# Patient Record
Sex: Female | Born: 1965 | Race: White | Hispanic: Yes | Marital: Single | Smoking: Never smoker
Health system: Southern US, Community
[De-identification: ages and names within clinical notes are randomized; demographics above are authoritative.]

## PROBLEM LIST (undated history)

## (undated) DIAGNOSIS — E079 Disorder of thyroid, unspecified: Secondary | ICD-10-CM

## (undated) DIAGNOSIS — E119 Type 2 diabetes mellitus without complications: Secondary | ICD-10-CM

## (undated) HISTORY — PX: FRACTURE SURGERY: SHX138
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
## (undated) NOTE — *Deleted (*Deleted)
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 Intravenous Given De Nurse, CRNA     11/15/2019 0656 PHENYLephrine 40 mcg/ml in normal saline Adult IV Push Syringe (For Blood Pressure Support) 80 mcg Intravenous Given De Nurse, CRNA     11/15/2019 0523 propofol (DIPRIVAN) 10 mg/mL bolus/IV push 140 mg Intravenous Given Molli Hazard, CRNA     11/15/2019 0523 succinylcholine 20 mg/mL (10 mL) syringe for Medfusion Pump - Optime 160 mg Intravenous Given Molli Hazard, CRNA     11/14/2019 2121 Tdap (BOOSTRIX) injection 0.5 mL 0.5 mL Intramuscular Given Phillips Grout, RN       Mobility {Mobility:20148}

---

## 2008-10-14 ENCOUNTER — Ambulatory Visit: Payer: Self-pay

## 2017-01-02 ENCOUNTER — Ambulatory Visit: Payer: Self-pay | Admitting: Adult Health Nurse Practitioner

## 2017-01-02 VITALS — BP 133/78 | HR 78 | Temp 97.9°F | Wt 190.2 lb

## 2017-01-02 DIAGNOSIS — M545 Low back pain, unspecified: Secondary | ICD-10-CM

## 2017-01-02 DIAGNOSIS — M79642 Pain in left hand: Secondary | ICD-10-CM

## 2017-01-02 DIAGNOSIS — Z0000 Encounter for general adult medical examination without abnormal findings: Secondary | ICD-10-CM

## 2017-01-02 MED ORDER — MELOXICAM 7.5 MG PO TABS: 7.5000 mg | ORAL_TABLET | ORAL | 0 refills | Status: DC | PRN

## 2017-01-02 NOTE — Patient Instructions (Signed)
Ejercicios para la espalda (Back Exercises) Si tiene dolor de espalda, haga estos ejercicios 2 o 3veces por da, o como se lo haya indicado el mdico. Cuando el dolor desaparezca, hgalos una vez por da, pero haga ms repeticiones de cada ejercicio. Si no le duele la espalda, haga estos ejercicios una vez por da o como se lo haya indicado el mdico. EJERCICIOS Rodilla al pecho Repita estos pasos 3 o 5veces seguidas con cada pierna: 1. Acustese boca arriba sobre una cama dura o sobre el suelo con las piernas extendidas. 2. Lleve una rodilla al pecho. 3. Mantenga la rodilla contra el pecho. Para lograrlo tmese la rodilla o el muslo. 4. Tire de la rodilla hasta sentir una elongacin suave en la parte baja de la espalda. 5. Mantenga la elongacin durante 10 a 30segundos. 6. Suelte y extienda la pierna lentamente. Inclinacin de la pelvis Repita estos pasos 5 o 10veces seguidas: 1. Acustese boca arriba sobre una cama dura o sobre el suelo con las piernas extendidas. 2. Flexione las rodillas de manera que apunten al techo. Los pies deben estar apoyados en el suelo. 3. Contraiga los msculos de la parte baja del vientre (abdomen) para empujar la zona lumbar contra el suelo. Este movimiento har que el cccix apunte hacia el techo, en lugar de apuntar hacia abajo en direccin a los pies o al suelo. 4. Mantenga esta posicin durante 5 a 10segundos mientras contrae suavemente los msculos y respira con normalidad. El perro y el gato Repita estos pasos hasta que la zona lumbar se curve con ms facilidad: 1. Apoye las palmas de las manos y las rodillas sobre una superficie firme. Las manos deben estar alineadas con los hombros y las rodillas con las caderas. Puede colocarse almohadillas debajo de las rodillas. 2. Deje caer la cabeza y lleve el cccix hacia abajo de modo que apunte en direccin al suelo para que la zona lumbar se arquee como el lomo de un gato asustado. 3. Mantenga esta posicin  durante 5segundos. 4. Lentamente, levante la cabeza y lleve el cccix hacia arriba de modo que apunte en direccin al techo para que la espalda se arquee (hunda) como el lomo de un perro contento. 5. Mantenga esta posicin durante 5segundos. Flexiones de brazos Repita estos pasos 5 o 10veces seguidas: 1. Acustese boca abajo en el suelo. 2. Ponga las manos cerca de la cabeza, separadas aproximadamente al ancho de los hombros. 3. Con la espalda relajada y las caderas apoyadas en el suelo, extienda lentamente los brazos para levantar la mitad superior del cuerpo y elevar los hombros. No use los msculos de la espalda. Para estar ms cmodo, puede cambiar la ubicacin de las manos. 4. Mantenga esta posicin durante 5segundos. 5. Lentamente vuelva a la posicin horizontal. Puentes Repita estos pasos 10veces seguidas: 1. Acustese boca arriba sobre una superficie firme. 2. Flexione las rodillas de manera que apunten al techo. Los pies deben estar apoyados en el suelo. 3. Contraiga los glteos y despegue las nalgas del suelo hasta que la cintura est casi a la altura de las rodillas. Si no siente el trabajo muscular en las nalgas y la parte posterior de los muslos, aleje los pies 1 o 2pulgadas (2,5 o 5centmetros) de las nalgas. 4. Mantenga esta posicin durante 3 a 5segundos. 5. Lentamente, vuelva a apoyar las nalgas en el suelo y relaje los glteos. Si este ejercicio le resulta muy fcil, intente realizarlo con los brazos cruzados sobre el pecho. Abdominales Repita estos pasos 5 o   10veces seguidas: 1. Acustese boca arriba sobre una cama dura o sobre el suelo con las piernas extendidas. 2. Flexione las rodillas de manera que apunten al techo. Los pies deben estar apoyados en el suelo. 3. Cruce los brazos sobre el pecho. 4. Baje levemente el mentn en direccin al pecho, pero no doble el cuello. 5. Contraiga los msculos del abdomen y con lentitud eleve el pecho lo suficiente como para  despegar levemente los omplatos del suelo. 6. Lentamente baje el pecho y la cabeza hasta el suelo. Elevaciones de espalda Repita estos pasos 5 o 10veces seguidas: 1. Acustese boca abajo con los brazos a los costados y apoye la frente en el suelo. 2. Contraiga los msculos de las piernas y los glteos. 3. Lentamente despegue el pecho del suelo mientras mantiene las caderas apoyadas en el suelo. Mantenga la nuca alineada con la curvatura de la espalda. Mire hacia el suelo mientras hace este ejercicio. 4. Mantenga esta posicin durante 3 a 5segundos. 5. Lentamente baje el pecho y el rostro hasta el suelo. SOLICITE AYUDA SI:  El dolor de espalda se vuelve mucho ms intenso cuando hace un ejercicio.  El dolor de espalda no se alivia 2horas despus de hacer los ejercicios. Si tiene alguno de estos problemas, deje de hacer los ejercicios. No vuelva a hacer los ejercicios a menos que el mdico lo autorice. SOLICITE AYUDA DE INMEDIATO SI:  Siente un dolor sbito y muy intenso en la espalda. Si esto ocurre, deje de hacer los ejercicios. No vuelva a hacer los ejercicios a menos que el mdico lo autorice. Esta informacin no tiene como fin reemplazar el consejo del mdico. Asegrese de hacerle al mdico cualquier pregunta que tenga. Document Released: 05/24/2010 Document Revised: 05/31/2015 Document Reviewed: 04/02/2014 Elsevier Interactive Patient Education  2018 Elsevier Inc.  

## 2017-01-02 NOTE — Progress Notes (Signed)
  Patient: Kristen Aguirre Female    DOB: 1965-09-14   51 y.o.   MRN: [id number] Visit Date: 01/02/2017  Today's Provider: Jacelyn Pieah Doles-Johnson, NP   No chief complaint on file.  Subjective:    HPI  Pt states that she often has pain in her back and hip.   L pinky pain while trying to assist a family member with a fall the pinky got malpositioned now it hurts all the time- itches on top.  Taking Tylenol for pain with minimal relief.     Not taking any medications regularly.   Allergies not on file This SmartLink is deprecated. Use AVSMEDLIST instead to display the medication list for a patient.  Review of Systems  All other systems reviewed and are negative.   Social History   Tobacco Use  . Smoking status: Not on file  Substance Use Topics  . Alcohol use: Not on file   Objective:   BP 133/78 (BP Location: Right Arm)   Pulse 78   Temp 97.9 F (36.6 C)   Wt 190 lb 3.2 oz (86.3 kg)   Physical Exam  Constitutional: She is oriented to person, place, and time. She appears well-developed and well-nourished.  HENT:  Head: Normocephalic and atraumatic.  Eyes: Pupils are equal, round, and reactive to light.  Neck: Normal range of motion. Neck supple.  Cardiovascular: Normal rate, regular rhythm, normal heart sounds and intact distal pulses.  Pulmonary/Chest: Effort normal and breath sounds normal.  Abdominal: Soft. Bowel sounds are normal.  Musculoskeletal: She exhibits no edema.       Lumbar back: She exhibits tenderness and pain. She exhibits normal range of motion, no edema and no deformity.       Hands: Tenderness to palpation of the L spine, some pain with ROM but full ROM.     Neurological: She is alert and oriented to person, place, and time.  Skin: Skin is warm and dry.        Assessment & Plan:     L hand pain:  Xray.  Meloxicam daily as needed for pain.   Back pain:  Referral to [facility].  Back exercises.  May also take the meloxicam PRN pain.    Routine labs ordered.  FU PRN labs and xray results.       Jacelyn Pieah Doles-Johnson, NP   [facility] [place]

## 2017-01-03 LAB — COMPREHENSIVE METABOLIC PANEL
ALT: 57 IU/L — ABNORMAL HIGH (ref 0–32)
AST: 45 IU/L — ABNORMAL HIGH (ref 0–40)
Albumin/Globulin Ratio: 1.4 (ref 1.2–2.2)
Albumin: 4 g/dL (ref 3.5–5.5)
Alkaline Phosphatase: 68 IU/L (ref 39–117)
BUN/Creatinine Ratio: 11 (ref 9–23)
BUN: 8 mg/dL (ref 6–24)
Bilirubin Total: <0.2 mg/dL (ref 0.0–1.2)
CO2: 27 mmol/L (ref 20–29)
Calcium: 9.1 mg/dL (ref 8.7–10.2)
Chloride: 101 mmol/L (ref 96–106)
Creatinine, Ser: 0.73 mg/dL (ref 0.57–1.00)
GFR calc Af Amer: 112 mL/min/{1.73_m2} (ref >59)
GFR calc non Af Amer: 97 mL/min/{1.73_m2} (ref >59)
Globulin, Total: 2.9 g/dL (ref 1.5–4.5)
Glucose: 140 mg/dL — ABNORMAL HIGH (ref 65–99)
Potassium: 4.2 mmol/L (ref 3.5–5.2)
Sodium: 143 mmol/L (ref 134–144)
Total Protein: 6.9 g/dL (ref 6.0–8.5)

## 2017-01-03 LAB — CBC
Hematocrit: 41.5 % (ref 34.0–46.6)
Hemoglobin: 14.2 g/dL (ref 11.1–15.9)
MCH: 31.8 pg (ref 26.6–33.0)
MCHC: 34.2 g/dL (ref 31.5–35.7)
MCV: 93 fL (ref 79–97)
Platelets: 233 10*3/uL (ref 150–379)
RBC: 4.46 x10E6/uL (ref 3.77–5.28)
RDW: 12.9 % (ref 12.3–15.4)
WBC: 7.6 10*3/uL (ref 3.4–10.8)

## 2017-01-03 LAB — HEMOGLOBIN A1C
Est. average glucose Bld gHb Est-mCnc: 137 mg/dL
Hgb A1c MFr Bld: 6.4 % — ABNORMAL HIGH (ref 4.8–5.6)

## 2017-01-03 LAB — TSH: TSH: 5.01 u[IU]/mL — ABNORMAL HIGH (ref 0.450–4.500)

## 2017-01-03 LAB — LIPID PANEL
Chol/HDL Ratio: 4.9 ratio — ABNORMAL HIGH (ref 0.0–4.4)
Cholesterol, Total: 232 mg/dL — ABNORMAL HIGH (ref 100–199)
HDL: 47 mg/dL (ref >39)
LDL Calculated: 134 mg/dL — ABNORMAL HIGH (ref 0–99)
Triglycerides: 257 mg/dL — ABNORMAL HIGH (ref 0–149)
VLDL Cholesterol Cal: 51 mg/dL — ABNORMAL HIGH (ref 5–40)

## 2017-01-18 ENCOUNTER — Ambulatory Visit: Payer: Self-pay | Admitting: Urology

## 2017-01-18 VITALS — BP 129/77 | HR 70 | Temp 98.7°F | Wt 190.6 lb

## 2017-01-18 DIAGNOSIS — Z0000 Encounter for general adult medical examination without abnormal findings: Secondary | ICD-10-CM

## 2017-01-18 NOTE — Progress Notes (Signed)
  Patient: Kristen Aguirre Female    DOB: 04-Mar-1965   51 y.o.   MRN: [id number] Visit Date: 01/18/2017  Today's Provider: [facility]   Chief Complaint  Patient presents with  . Hand Pain    thinks she broke her finger and says it is itchy and hurts when she uses it. 8/10 on pain scale, has tried soaking it in vinger but no reflief of symptoms   Subjective:    Kristen Aguirre is a 51 y/o woman with no significant PMHx here for f/u of hand pain and labs  Labs A1c 6.4. Elevated lipids. Poor diet. Lots of fried foods. Lots of carbs. Little fruits/vegetables. Reports no exercise due to lack of exercise  Thyroid TSH elevated. Pt reports increased loss of hair over the past year. Energy levels low.   Left hand pain: over last 2 months. Tylenol improves pain. Was evaluated here a few weeks ago. Did not have xray done.   Back pain. Stable. No radiating pains. No bowel/bladder incontinence. Did not see chiropractor.       No Known Allergies This SmartLink is deprecated. Use AVSMEDLIST instead to display the medication list for a patient.  Review of Systems  All other systems reviewed and are negative.   Social History   Tobacco Use  . Smoking status: Never Smoker  . Smokeless tobacco: Never Used  Substance Use Topics  . Alcohol use: Yes   Objective:   BP 129/77   Pulse 70   Temp 98.7 F (37.1 C)   Wt 190 lb 9.6 oz (86.5 kg)   Physical Exam  Constitutional: She is oriented to person, place, and time. She appears well-developed and well-nourished.  HENT:  Head: Normocephalic and atraumatic.  Eyes: Conjunctivae are normal.  Cardiovascular: Normal rate, regular rhythm and normal heart sounds.  Pulmonary/Chest: Effort normal and breath sounds normal.  Musculoskeletal:       Arms: Neurological: She is alert and oriented to person, place, and time.  Skin: Skin is warm and dry.  Psychiatric: She has a normal mood and affect.        Assessment &  Plan:     Kristen Aguirre is a 51 y/o woman with no significant PMHx here for f/u of hand pain and labs  Hypothyroid Started levothyroxine today. F/u in one month for repeat TSH.  Pre-DM A1c 6.4. Encouraged diet/exercise regimen, pt verbalizes understanding.  Elevated lipids Elevated lipid panel. Encouraged diet/exercise regimen, pt verbalizes understanding.  Hand pain Stable since last visit. Pt to get xray. Pt wlil pick up meloxicam prescription and try. Pt will try icing as well.   Back pain Stable. Pt to return to see chiropractor      [facility]   [facility] [place]

## 2017-01-23 MED ORDER — LEVOTHYROXINE SODIUM 50 MCG PO TABS: 50.0000 ug | ORAL_TABLET | Freq: Every day | ORAL | 1 refills | Status: DC

## 2017-01-24 ENCOUNTER — Ambulatory Visit: Payer: Self-pay | Admitting: Chiropractor

## 2017-01-31 ENCOUNTER — Ambulatory Visit: Payer: Self-pay | Admitting: Specialist

## 2017-02-07 ENCOUNTER — Ambulatory Visit: Payer: Self-pay | Admitting: Specialist

## 2017-02-07 DIAGNOSIS — M549 Dorsalgia, unspecified: Secondary | ICD-10-CM

## 2017-02-07 NOTE — Progress Notes (Signed)
   Subjective:    Patient ID: Kristen Aguirre, female    DOB: 08-05-1965, 51 y.o.   MRN: [id number]   Exam done with Maralyn SagoSarah from [facility].  HPI Several year of hx axial back pain.    Review of Systems     Objective:   Physical Exam Gait normal. She is able to heel/toe walk. She does a full squat with increased LBP. She arises monophasic manner. On inspection, her shoulders and pelvis are level. She is able to march in place with normal muscle recruitment and relaxation. She is able to stand on eavh leg independently with neg Trendelenburg sign.  Toe signs are downward. No clonus present. KJ 1+=. AJ 2+=. Sensation Normal. MMT 5/5. SLR neg at 90 degrees sitting. No TTP.  Incidentally notes mallet deformity left 5th finger. Telling pt to go to xray and will follow up with PCP.      Assessment & Plan:  IMP: axial back pain. Probable age and weight. [employer] for PT home exercise program. Return PRN.

## 2017-02-15 ENCOUNTER — Other Ambulatory Visit: Payer: Self-pay

## 2017-02-22 ENCOUNTER — Ambulatory Visit: Payer: Self-pay

## 2017-02-28 ENCOUNTER — Ambulatory Visit: Payer: Self-pay | Attending: Oncology | Admitting: *Deleted

## 2017-02-28 ENCOUNTER — Ambulatory Visit
Admission: RE | Admit: 2017-02-28 | Discharge: 2017-02-28 | Disposition: A | Discharge status: Home / Self Care | Payer: Self-pay | Source: Ambulatory Visit | Attending: Oncology | Admitting: Oncology

## 2017-02-28 ENCOUNTER — Encounter: Payer: Self-pay | Admitting: *Deleted

## 2017-02-28 VITALS — BP 112/77 | HR 58 | Temp 97.4°F | Resp 18 | Ht 63.0 in | Wt 183.0 lb

## 2017-02-28 DIAGNOSIS — Z0000 Encounter for general adult medical examination without abnormal findings: Secondary | ICD-10-CM

## 2017-02-28 NOTE — Patient Instructions (Signed)
Gave patient hand-out, Women Staying Healthy, Active and Well from [facility], with education on breast health, pap smears, heart and colon health. 

## 2017-02-28 NOTE — Progress Notes (Signed)
Subjective:     Patient ID: Kristen Aguirre, female   DOB: October 21, 1965, 52 y.o.   MRN: [id number]  HPI   Review of Systems     Objective:   Physical Exam  Pulmonary/Chest: Right breast exhibits no inverted nipple, no mass, no nipple discharge, no skin change and no tenderness. Left breast exhibits no inverted nipple, no mass, no nipple discharge, no skin change and no tenderness. Breasts are symmetrical.  Abdominal: There is no splenomegaly or hepatomegaly.  Genitourinary: No labial fusion. There is no rash, tenderness, lesion or injury on the right labia. There is no rash, tenderness, lesion or injury on the left labia. Right adnexum displays no mass, no tenderness and no fullness. Left adnexum displays no mass, no tenderness and no fullness. No erythema, tenderness or bleeding in the vagina. No foreign body in the vagina. No signs of injury around the vagina. No vaginal discharge found.  Genitourinary Comments: Patient with history of hysterectomy for a deceased fetus       Assessment:     52 year [facility] Hispanic [facility] pelvic exam and [facility].  Kristen CockingMaritza, the interpreter present during the interview and exam.  Clinical breast exam unremarkable.  Taught self breast awareness.  Pelvic exam without masses or lesions, no evidence of a cervix noted with is compatible with a hysterectomy.  Patient has been screened for eligibility.  She does not have any insurance, Medicare or Medicaid.  She also meets financial eligibility.  Hand-out given on the Affordable Care Act.    Plan:     Screening mammogram ordered.  Will follow-up per [facility] protocol.

## 2017-03-08 ENCOUNTER — Other Ambulatory Visit: Payer: Self-pay

## 2017-03-08 DIAGNOSIS — Z0000 Encounter for general adult medical examination without abnormal findings: Secondary | ICD-10-CM

## 2017-03-09 LAB — TSH: TSH: 1.53 u[IU]/mL (ref 0.450–4.500)

## 2017-03-13 ENCOUNTER — Ambulatory Visit: Payer: Self-pay

## 2017-03-21 ENCOUNTER — Encounter: Payer: Self-pay | Admitting: *Deleted

## 2017-03-21 NOTE — Progress Notes (Signed)
Letter mailed from the [facility] to inform patient of her normal mammogram results.  Patient is to follow-up with annual screening in one year.  [facility] to [place]. 

## 2017-03-27 ENCOUNTER — Telehealth: Payer: Self-pay | Admitting: Pharmacy Technician

## 2017-03-27 NOTE — Telephone Encounter (Signed)
Pt is approved to receive medication assistance at [facility] through 2019, as long as eligibility continues to be met.  Betty J. Kluttz Care Manager [facility] 

## 2018-05-06 ENCOUNTER — Ambulatory Visit
Admission: RE | Admit: 2018-05-06 | Discharge: 2018-05-06 | Disposition: A | Discharge status: Home / Self Care | Payer: Self-pay | Source: Ambulatory Visit | Attending: Oncology | Admitting: Oncology

## 2018-05-06 ENCOUNTER — Ambulatory Visit: Payer: Self-pay | Attending: Oncology

## 2018-05-06 ENCOUNTER — Other Ambulatory Visit: Payer: Self-pay

## 2018-05-06 VITALS — BP 138/102 | HR 72 | Temp 98.4°F | Ht 60.0 in | Wt 175.0 lb

## 2018-05-06 DIAGNOSIS — Z0000 Encounter for general adult medical examination without abnormal findings: Secondary | ICD-10-CM | POA: Insufficient documentation

## 2018-05-06 NOTE — Progress Notes (Signed)
  Subjective:     Patient ID: Kristen Aguirre, female   DOB: 09/10/1965, 53 y.o.   MRN: [id number]  HPI   Review of Systems     Objective:   Physical Exam Chest:     Breasts:        Right: No swelling, bleeding, inverted nipple, mass, nipple discharge, skin change or tenderness.        Left: No swelling, bleeding, inverted nipple, mass, nipple discharge, skin change or tenderness.        Assessment:     53 year old hispanic patient presents for [facility] clinic visit.  Jaqui Laukaitis interpreted exam. Patient screened, and meets [facility] eligibility.  Patient does not have insurance, Medicare or Medicaid.  Handout given on Affordable Care Act.  Instructed patient on breast self awareness using teach back method.  Clinical breast exam unremarkable.  No mass or lump palpated.  Patient originally from [place].    Plan:     Sent for bilateral screening mammogram.

## 2018-05-08 NOTE — Progress Notes (Signed)
Letter mailed from [facility] to notify of normal mammogram results.  Patient to return in one year for annual screening.  Copy to [facility]. 

## 2018-10-02 ENCOUNTER — Telehealth: Payer: Self-pay | Admitting: Pharmacy Technician

## 2018-10-02 NOTE — Telephone Encounter (Signed)
Patient failed to provide2020 financial documentation. No additional medication assistance will be provided by [facility] without the required proof of income documentation. Patient notified by letter.  Hazelene Doten, CPhT [facility] 

## 2019-11-14 ENCOUNTER — Emergency Department (HOSPITAL_COMMUNITY): Payer: Self-pay

## 2019-11-14 ENCOUNTER — Other Ambulatory Visit: Payer: Self-pay

## 2019-11-14 ENCOUNTER — Encounter (HOSPITAL_COMMUNITY): Payer: Self-pay | Admitting: Emergency Medicine

## 2019-11-14 ENCOUNTER — Inpatient Hospital Stay (HOSPITAL_COMMUNITY)
Admission: EM | Admit: 2019-11-14 | Discharge: 2019-12-04 | DRG: 957 | Disposition: A | Discharge status: Rehabilitation Facility | Payer: HRSA Program | Attending: General Surgery | Admitting: General Surgery

## 2019-11-14 DIAGNOSIS — S61217A Laceration without foreign body of left little finger without damage to nail, initial encounter: Secondary | ICD-10-CM | POA: Diagnosis present

## 2019-11-14 DIAGNOSIS — S52601B Unspecified fracture of lower end of right ulna, initial encounter for open fracture type I or II: Secondary | ICD-10-CM | POA: Diagnosis present

## 2019-11-14 DIAGNOSIS — M4854XA Collapsed vertebra, not elsewhere classified, thoracic region, initial encounter for fracture: Secondary | ICD-10-CM | POA: Diagnosis present

## 2019-11-14 DIAGNOSIS — D62 Acute posthemorrhagic anemia: Secondary | ICD-10-CM | POA: Diagnosis present

## 2019-11-14 DIAGNOSIS — Y92410 Unspecified street and highway as the place of occurrence of the external cause: Secondary | ICD-10-CM

## 2019-11-14 DIAGNOSIS — S22081A Stable burst fracture of T11-T12 vertebra, initial encounter for closed fracture: Secondary | ICD-10-CM | POA: Diagnosis present

## 2019-11-14 DIAGNOSIS — S72052A Unspecified fracture of head of left femur, initial encounter for closed fracture: Secondary | ICD-10-CM | POA: Diagnosis present

## 2019-11-14 DIAGNOSIS — R40241 Glasgow coma scale score 13-15, unspecified time: Secondary | ICD-10-CM | POA: Diagnosis present

## 2019-11-14 DIAGNOSIS — Z6835 Body mass index (BMI) 35.0-35.9, adult: Secondary | ICD-10-CM

## 2019-11-14 DIAGNOSIS — T148XXA Other injury of unspecified body region, initial encounter: Secondary | ICD-10-CM

## 2019-11-14 DIAGNOSIS — Y906 Blood alcohol level of 120-199 mg/100 ml: Secondary | ICD-10-CM | POA: Diagnosis present

## 2019-11-14 DIAGNOSIS — S52121A Displaced fracture of head of right radius, initial encounter for closed fracture: Secondary | ICD-10-CM | POA: Diagnosis present

## 2019-11-14 DIAGNOSIS — S301XXA Contusion of abdominal wall, initial encounter: Secondary | ICD-10-CM | POA: Diagnosis present

## 2019-11-14 DIAGNOSIS — S62627A Displaced fracture of medial phalanx of left little finger, initial encounter for closed fracture: Secondary | ICD-10-CM | POA: Diagnosis present

## 2019-11-14 DIAGNOSIS — Z79899 Other long term (current) drug therapy: Secondary | ICD-10-CM

## 2019-11-14 DIAGNOSIS — K5900 Constipation, unspecified: Secondary | ICD-10-CM | POA: Diagnosis present

## 2019-11-14 DIAGNOSIS — S060X9A Concussion with loss of consciousness of unspecified duration, initial encounter: Secondary | ICD-10-CM | POA: Diagnosis present

## 2019-11-14 DIAGNOSIS — S52201A Unspecified fracture of shaft of right ulna, initial encounter for closed fracture: Secondary | ICD-10-CM | POA: Diagnosis present

## 2019-11-14 DIAGNOSIS — Z23 Encounter for immunization: Secondary | ICD-10-CM

## 2019-11-14 DIAGNOSIS — N1330 Unspecified hydronephrosis: Secondary | ICD-10-CM | POA: Diagnosis present

## 2019-11-14 DIAGNOSIS — E039 Hypothyroidism, unspecified: Secondary | ICD-10-CM | POA: Diagnosis present

## 2019-11-14 DIAGNOSIS — E8889 Other specified metabolic disorders: Secondary | ICD-10-CM | POA: Diagnosis present

## 2019-11-14 DIAGNOSIS — Z419 Encounter for procedure for purposes other than remedying health state, unspecified: Secondary | ICD-10-CM

## 2019-11-14 DIAGNOSIS — S73005A Unspecified dislocation of left hip, initial encounter: Secondary | ICD-10-CM | POA: Diagnosis present

## 2019-11-14 DIAGNOSIS — R7989 Other specified abnormal findings of blood chemistry: Secondary | ICD-10-CM | POA: Diagnosis not present

## 2019-11-14 DIAGNOSIS — N6489 Other specified disorders of breast: Secondary | ICD-10-CM | POA: Diagnosis present

## 2019-11-14 DIAGNOSIS — S52501B Unspecified fracture of the lower end of right radius, initial encounter for open fracture type I or II: Secondary | ICD-10-CM | POA: Diagnosis present

## 2019-11-14 DIAGNOSIS — U071 COVID-19: Secondary | ICD-10-CM | POA: Diagnosis present

## 2019-11-14 DIAGNOSIS — I959 Hypotension, unspecified: Secondary | ICD-10-CM | POA: Diagnosis present

## 2019-11-14 DIAGNOSIS — T796XXA Traumatic ischemia of muscle, initial encounter: Secondary | ICD-10-CM | POA: Diagnosis present

## 2019-11-14 DIAGNOSIS — N2882 Megaloureter: Secondary | ICD-10-CM | POA: Diagnosis present

## 2019-11-14 DIAGNOSIS — S37011A Minor contusion of right kidney, initial encounter: Secondary | ICD-10-CM | POA: Diagnosis present

## 2019-11-14 DIAGNOSIS — Z794 Long term (current) use of insulin: Secondary | ICD-10-CM

## 2019-11-14 DIAGNOSIS — E559 Vitamin D deficiency, unspecified: Secondary | ICD-10-CM | POA: Diagnosis present

## 2019-11-14 DIAGNOSIS — F1010 Alcohol abuse, uncomplicated: Secondary | ICD-10-CM | POA: Diagnosis present

## 2019-11-14 DIAGNOSIS — S52121C Displaced fracture of head of right radius, initial encounter for open fracture type IIIA, IIIB, or IIIC: Secondary | ICD-10-CM

## 2019-11-14 DIAGNOSIS — S32422A Displaced fracture of posterior wall of left acetabulum, initial encounter for closed fracture: Secondary | ICD-10-CM | POA: Diagnosis present

## 2019-11-14 DIAGNOSIS — E1140 Type 2 diabetes mellitus with diabetic neuropathy, unspecified: Secondary | ICD-10-CM | POA: Diagnosis not present

## 2019-11-14 DIAGNOSIS — S52571A Other intraarticular fracture of lower end of right radius, initial encounter for closed fracture: Secondary | ICD-10-CM

## 2019-11-14 DIAGNOSIS — R748 Abnormal levels of other serum enzymes: Secondary | ICD-10-CM | POA: Diagnosis present

## 2019-11-14 DIAGNOSIS — T1490XA Injury, unspecified, initial encounter: Secondary | ICD-10-CM | POA: Diagnosis present

## 2019-11-14 DIAGNOSIS — T07XXXA Unspecified multiple injuries, initial encounter: Secondary | ICD-10-CM | POA: Diagnosis present

## 2019-11-14 LAB — PROTIME-INR
INR: 1 (ref 0.8–1.2)
Prothrombin Time: 12.6 seconds (ref 11.4–15.2)

## 2019-11-14 LAB — CBC WITH DIFFERENTIAL/PLATELET
Abs Immature Granulocytes: 0.1 10*3/uL — ABNORMAL HIGH (ref 0.00–0.07)
Basophils Absolute: 0.1 10*3/uL (ref 0.0–0.1)
Basophils Relative: 1 %
Eosinophils Absolute: 0 10*3/uL (ref 0.0–0.5)
Eosinophils Relative: 0 %
HCT: 40.6 % (ref 36.0–46.0)
Hemoglobin: 13.1 g/dL (ref 12.0–15.0)
Immature Granulocytes: 1 %
Lymphocytes Relative: 13 %
Lymphs Abs: 1.1 10*3/uL (ref 0.7–4.0)
MCH: 32 pg (ref 26.0–34.0)
MCHC: 32.3 g/dL (ref 30.0–36.0)
MCV: 99 fL (ref 80.0–100.0)
Monocytes Absolute: 0.4 10*3/uL (ref 0.1–1.0)
Monocytes Relative: 5 %
Neutro Abs: 6.5 10*3/uL (ref 1.7–7.7)
Neutrophils Relative %: 80 %
Platelets: 199 10*3/uL (ref 150–400)
RBC: 4.1 MIL/uL (ref 3.87–5.11)
RDW: 12.2 % (ref 11.5–15.5)
WBC: 8.2 10*3/uL (ref 4.0–10.5)
nRBC: 0 % (ref 0.0–0.2)

## 2019-11-14 LAB — I-STAT BETA HCG BLOOD, ED (MC, WL, AP ONLY): I-stat hCG, quantitative: <5 m[IU]/mL (ref <5)

## 2019-11-14 LAB — COMPREHENSIVE METABOLIC PANEL
ALT: 179 U/L — ABNORMAL HIGH (ref 0–44)
AST: 451 U/L — ABNORMAL HIGH (ref 15–41)
Albumin: 3.3 g/dL — ABNORMAL LOW (ref 3.5–5.0)
Alkaline Phosphatase: 65 U/L (ref 38–126)
Anion gap: 14 (ref 5–15)
BUN: 5 mg/dL — ABNORMAL LOW (ref 6–20)
CO2: 20 mmol/L — ABNORMAL LOW (ref 22–32)
Calcium: 8.3 mg/dL — ABNORMAL LOW (ref 8.9–10.3)
Chloride: 105 mmol/L (ref 98–111)
Creatinine, Ser: 0.86 mg/dL (ref 0.44–1.00)
GFR calc Af Amer: >60 mL/min (ref >60)
GFR calc non Af Amer: >60 mL/min (ref >60)
Glucose, Bld: 162 mg/dL — ABNORMAL HIGH (ref 70–99)
Potassium: 3.9 mmol/L (ref 3.5–5.1)
Sodium: 139 mmol/L (ref 135–145)
Total Bilirubin: 0.7 mg/dL (ref 0.3–1.2)
Total Protein: 6.6 g/dL (ref 6.5–8.1)

## 2019-11-14 LAB — I-STAT CHEM 8, ED
BUN: 4 mg/dL — ABNORMAL LOW (ref 6–20)
Calcium, Ion: 1.03 mmol/L — ABNORMAL LOW (ref 1.15–1.40)
Chloride: 104 mmol/L (ref 98–111)
Creatinine, Ser: 1.2 mg/dL — ABNORMAL HIGH (ref 0.44–1.00)
Glucose, Bld: 158 mg/dL — ABNORMAL HIGH (ref 70–99)
HCT: 39 % (ref 36.0–46.0)
Hemoglobin: 13.3 g/dL (ref 12.0–15.0)
Potassium: 4 mmol/L (ref 3.5–5.1)
Sodium: 140 mmol/L (ref 135–145)
TCO2: 23 mmol/L (ref 22–32)

## 2019-11-14 LAB — ETHANOL: Alcohol, Ethyl (B): 177 mg/dL — ABNORMAL HIGH (ref <10)

## 2019-11-14 LAB — LACTIC ACID, PLASMA: Lactic Acid, Venous: 3.9 mmol/L (ref 0.5–1.9)

## 2019-11-14 LAB — ABO/RH: ABO/RH(D): A POS

## 2019-11-14 MED ORDER — GABAPENTIN 300 MG PO CAPS
300.0000 mg | ORAL_CAPSULE | Freq: Three times a day (TID) | ORAL | Status: DC
  Administered 2019-11-15 – 2019-12-04 (×57): 300 mg via ORAL
  Filled 2019-11-14 (×57): qty 1

## 2019-11-14 MED ORDER — ONDANSETRON HCL 4 MG/2ML IJ SOLN
4.0000 mg | Freq: Four times a day (QID) | INTRAMUSCULAR | Status: DC | PRN
  Administered 2019-11-16 – 2019-11-17 (×2): 4 mg via INTRAVENOUS
  Filled 2019-11-14: qty 2

## 2019-11-14 MED ORDER — HYDROMORPHONE HCL 1 MG/ML IJ SOLN
0.5000 mg | INTRAMUSCULAR | Status: DC | PRN
  Administered 2019-11-15 (×2): 0.5 mg via INTRAVENOUS
  Administered 2019-11-17 (×2): .5 mg via INTRAVENOUS
  Administered 2019-11-18 – 2019-11-21 (×6): 0.5 mg via INTRAVENOUS
  Filled 2019-11-14 (×2): qty 0.5
  Filled 2019-11-14: qty 1
  Filled 2019-11-14 (×2): qty 0.5
  Filled 2019-11-14: qty 1
  Filled 2019-11-14 (×2): qty 0.5

## 2019-11-14 MED ORDER — ONDANSETRON 4 MG PO TBDP: 4.0000 mg | ORAL_TABLET | Freq: Four times a day (QID) | ORAL | Status: DC | PRN

## 2019-11-14 MED ORDER — SODIUM CHLORIDE 0.9 % IV SOLN: INTRAVENOUS | Status: DC

## 2019-11-14 MED ORDER — CEFAZOLIN SODIUM-DEXTROSE 2-4 GM/100ML-% IV SOLN: 2.0000 g | Freq: Three times a day (TID) | INTRAVENOUS | Status: DC

## 2019-11-14 MED ORDER — KETAMINE HCL 50 MG/5ML IJ SOSY: 150.0000 mg | PREFILLED_SYRINGE | Freq: Once | INTRAMUSCULAR | Status: DC

## 2019-11-14 MED ORDER — CEFAZOLIN SODIUM-DEXTROSE 1-4 GM/50ML-% IV SOLN
INTRAVENOUS | Status: AC | PRN
  Administered 2019-11-14: 1 g via INTRAVENOUS

## 2019-11-14 MED ORDER — OXYCODONE HCL 5 MG PO TABS
5.0000 mg | ORAL_TABLET | ORAL | Status: DC | PRN
  Administered 2019-11-15 – 2019-11-18 (×6): 5 mg via ORAL
  Filled 2019-11-14 (×6): qty 1

## 2019-11-14 MED ORDER — CEFAZOLIN SODIUM-DEXTROSE 2-4 GM/100ML-% IV SOLN: 2.0000 g | Freq: Once | INTRAVENOUS | Status: DC

## 2019-11-14 MED ORDER — METHOCARBAMOL 500 MG PO TABS: 500.0000 mg | ORAL_TABLET | Freq: Four times a day (QID) | ORAL | Status: DC | PRN

## 2019-11-14 MED ORDER — FENTANYL CITRATE (PF) 100 MCG/2ML IJ SOLN
50.0000 ug | Freq: Once | INTRAMUSCULAR | Status: AC
  Administered 2019-11-14: 50 ug via INTRAVENOUS
  Filled 2019-11-14: qty 2

## 2019-11-14 MED ORDER — ACETAMINOPHEN 325 MG PO TABS
650.0000 mg | ORAL_TABLET | Freq: Four times a day (QID) | ORAL | Status: DC
  Administered 2019-11-15 – 2019-12-04 (×75): 650 mg via ORAL
  Filled 2019-11-14 (×75): qty 2

## 2019-11-14 MED ORDER — FENTANYL CITRATE (PF) 100 MCG/2ML IJ SOLN
50.0000 ug | INTRAMUSCULAR | Status: DC | PRN
  Administered 2019-11-14 – 2019-11-16 (×4): 50 ug via INTRAVENOUS
  Filled 2019-11-14 (×7): qty 2

## 2019-11-14 MED ORDER — TETANUS-DIPHTH-ACELL PERTUSSIS 5-2.5-18.5 LF-MCG/0.5 IM SUSP
0.5000 mL | Freq: Once | INTRAMUSCULAR | Status: AC
  Administered 2019-11-14: 0.5 mL via INTRAMUSCULAR

## 2019-11-14 MED ORDER — IOHEXOL 350 MG/ML SOLN
100.0000 mL | Freq: Once | INTRAVENOUS | Status: AC | PRN
  Administered 2019-11-14: 100 mL via INTRAVENOUS

## 2019-11-14 MED ORDER — SODIUM CHLORIDE 0.9 % IV SOLN
INTRAVENOUS | Status: AC | PRN
  Administered 2019-11-14 (×2): 1000 mL via INTRAVENOUS
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
## 2019-11-14 NOTE — ED Notes (Signed)
Patient transported to CT 
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
## 2019-11-15 ENCOUNTER — Inpatient Hospital Stay (HOSPITAL_COMMUNITY): Payer: Self-pay | Admitting: Anesthesiology

## 2019-11-15 ENCOUNTER — Encounter (HOSPITAL_COMMUNITY): Admission: EM | Disposition: A | Discharge status: Rehabilitation Facility | Payer: Self-pay | Source: Home / Self Care

## 2019-11-15 ENCOUNTER — Inpatient Hospital Stay (HOSPITAL_COMMUNITY): Payer: Self-pay

## 2019-11-15 HISTORY — PX: HIP CLOSED REDUCTION: SHX983

## 2019-11-15 HISTORY — PX: OPEN REDUCTION INTERNAL FIXATION (ORIF) DISTAL RADIAL FRACTURE: SHX5989

## 2019-11-15 HISTORY — PX: I & D EXTREMITY: SHX5045

## 2019-11-15 LAB — CBC
HCT: 30.4 % — ABNORMAL LOW (ref 36.0–46.0)
HCT: 30 % — ABNORMAL LOW (ref 36.0–46.0)
Hemoglobin: 9.9 g/dL — ABNORMAL LOW (ref 12.0–15.0)
Hemoglobin: 9.6 g/dL — ABNORMAL LOW (ref 12.0–15.0)
MCH: 31.7 pg (ref 26.0–34.0)
MCH: 31.4 pg (ref 26.0–34.0)
MCHC: 32.6 g/dL (ref 30.0–36.0)
MCHC: 32 g/dL (ref 30.0–36.0)
MCV: 97.4 fL (ref 80.0–100.0)
MCV: 98 fL (ref 80.0–100.0)
Platelets: 143 10*3/uL — ABNORMAL LOW (ref 150–400)
Platelets: 143 10*3/uL — ABNORMAL LOW (ref 150–400)
RBC: 3.12 MIL/uL — ABNORMAL LOW (ref 3.87–5.11)
RBC: 3.06 MIL/uL — ABNORMAL LOW (ref 3.87–5.11)
RDW: 13.1 % (ref 11.5–15.5)
RDW: 13.2 % (ref 11.5–15.5)
WBC: 4.2 10*3/uL (ref 4.0–10.5)
WBC: 4.5 10*3/uL (ref 4.0–10.5)
nRBC: 0 % (ref 0.0–0.2)
nRBC: 0 % (ref 0.0–0.2)

## 2019-11-15 LAB — BASIC METABOLIC PANEL
Anion gap: 9 (ref 5–15)
BUN: <5 mg/dL — ABNORMAL LOW (ref 6–20)
CO2: 19 mmol/L — ABNORMAL LOW (ref 22–32)
Calcium: 7.1 mg/dL — ABNORMAL LOW (ref 8.9–10.3)
Chloride: 110 mmol/L (ref 98–111)
Creatinine, Ser: 0.7 mg/dL (ref 0.44–1.00)
GFR calc Af Amer: >60 mL/min (ref >60)
GFR calc non Af Amer: >60 mL/min (ref >60)
Glucose, Bld: 200 mg/dL — ABNORMAL HIGH (ref 70–99)
Potassium: 4.2 mmol/L (ref 3.5–5.1)
Sodium: 138 mmol/L (ref 135–145)

## 2019-11-15 LAB — BPAM FFP
Blood Product Expiration Date: 202110072359
ISSUE DATE / TIME: 202109242256
Unit Type and Rh: 6200

## 2019-11-15 LAB — GLUCOSE, CAPILLARY
Glucose-Capillary: 160 mg/dL — ABNORMAL HIGH (ref 70–99)
Glucose-Capillary: 197 mg/dL — ABNORMAL HIGH (ref 70–99)
Glucose-Capillary: 219 mg/dL — ABNORMAL HIGH (ref 70–99)
Glucose-Capillary: 221 mg/dL — ABNORMAL HIGH (ref 70–99)
Glucose-Capillary: 223 mg/dL — ABNORMAL HIGH (ref 70–99)

## 2019-11-15 LAB — PREPARE FRESH FROZEN PLASMA: Unit division: 0

## 2019-11-15 LAB — RESPIRATORY PANEL BY RT PCR (FLU A&B, COVID)
Influenza A by PCR: NEGATIVE
Influenza B by PCR: NEGATIVE
SARS Coronavirus 2 by RT PCR: POSITIVE — AB

## 2019-11-15 LAB — COMPREHENSIVE METABOLIC PANEL
ALT: 124 U/L — ABNORMAL HIGH (ref 0–44)
AST: 295 U/L — ABNORMAL HIGH (ref 15–41)
Albumin: 2.6 g/dL — ABNORMAL LOW (ref 3.5–5.0)
Alkaline Phosphatase: 51 U/L (ref 38–126)
Anion gap: 15 (ref 5–15)
BUN: <5 mg/dL — ABNORMAL LOW (ref 6–20)
CO2: 17 mmol/L — ABNORMAL LOW (ref 22–32)
Calcium: 7 mg/dL — ABNORMAL LOW (ref 8.9–10.3)
Chloride: 109 mmol/L (ref 98–111)
Creatinine, Ser: 0.62 mg/dL (ref 0.44–1.00)
GFR calc Af Amer: >60 mL/min (ref >60)
GFR calc non Af Amer: >60 mL/min (ref >60)
Glucose, Bld: 198 mg/dL — ABNORMAL HIGH (ref 70–99)
Potassium: 4 mmol/L (ref 3.5–5.1)
Sodium: 141 mmol/L (ref 135–145)
Total Bilirubin: 0.7 mg/dL (ref 0.3–1.2)
Total Protein: 5.1 g/dL — ABNORMAL LOW (ref 6.5–8.1)

## 2019-11-15 LAB — URINALYSIS, ROUTINE W REFLEX MICROSCOPIC
Bacteria, UA: NONE SEEN
Bilirubin Urine: NEGATIVE
Glucose, UA: 50 mg/dL — AB
Ketones, ur: 5 mg/dL — AB
Leukocytes,Ua: NEGATIVE
Nitrite: NEGATIVE
Protein, ur: NEGATIVE mg/dL
RBC / HPF: >50 RBC/hpf — ABNORMAL HIGH (ref 0–5)
Specific Gravity, Urine: >1.046 — ABNORMAL HIGH (ref 1.005–1.030)
pH: 5 (ref 5.0–8.0)

## 2019-11-15 LAB — HIV ANTIBODY (ROUTINE TESTING W REFLEX): HIV Screen 4th Generation wRfx: NONREACTIVE

## 2019-11-15 LAB — MRSA PCR SCREENING: MRSA by PCR: NEGATIVE

## 2019-11-15 LAB — HEMOGLOBIN A1C
Hgb A1c MFr Bld: 6.4 % — ABNORMAL HIGH (ref 4.8–5.6)
Mean Plasma Glucose: 136.98 mg/dL

## 2019-11-15 LAB — LACTIC ACID, PLASMA: Lactic Acid, Venous: 1.3 mmol/L (ref 0.5–1.9)

## 2019-11-15 SURGERY — IRRIGATION AND DEBRIDEMENT EXTREMITY
Anesthesia: General | Site: Wrist | Laterality: Right

## 2019-11-15 MED ORDER — CEFAZOLIN SODIUM-DEXTROSE 2-3 GM-%(50ML) IV SOLR
INTRAVENOUS | Status: DC | PRN
  Administered 2019-11-15: 2 g via INTRAVENOUS

## 2019-11-15 MED ORDER — LIDOCAINE HCL (CARDIAC) PF 100 MG/5ML IV SOSY
PREFILLED_SYRINGE | INTRAVENOUS | Status: DC | PRN
  Administered 2019-11-15: 60 mg via INTRAVENOUS

## 2019-11-15 MED ORDER — SUCCINYLCHOLINE 20MG/ML (10ML) SYRINGE FOR MEDFUSION PUMP - OPTIME
INTRAMUSCULAR | Status: DC | PRN
  Administered 2019-11-15: 160 mg via INTRAVENOUS

## 2019-11-15 MED ORDER — ALBUMIN HUMAN 5 % IV SOLN: INTRAVENOUS | Status: DC | PRN

## 2019-11-15 MED ORDER — CHLORHEXIDINE GLUCONATE CLOTH 2 % EX PADS
6.0000 | MEDICATED_PAD | Freq: Every day | CUTANEOUS | Status: DC
  Administered 2019-11-15 – 2019-12-03 (×18): 6 via TOPICAL

## 2019-11-15 MED ORDER — ORAL CARE MOUTH RINSE
15.0000 mL | Freq: Two times a day (BID) | OROMUCOSAL | Status: DC
  Administered 2019-11-15 – 2019-12-03 (×38): 15 mL via OROMUCOSAL

## 2019-11-15 MED ORDER — FENTANYL CITRATE (PF) 250 MCG/5ML IJ SOLN
INTRAMUSCULAR | Status: AC
  Filled 2019-11-15: qty 5

## 2019-11-15 MED ORDER — FENTANYL CITRATE (PF) 250 MCG/5ML IJ SOLN
INTRAMUSCULAR | Status: DC | PRN
Start: 2019-11-15 — End: 2019-11-15
  Administered 2019-11-15: 100 ug via INTRAVENOUS
  Administered 2019-11-15 (×2): 50 ug via INTRAVENOUS

## 2019-11-15 MED ORDER — BUPIVACAINE HCL (PF) 0.25 % IJ SOLN
INTRAMUSCULAR | Status: AC
  Filled 2019-11-15: qty 30

## 2019-11-15 MED ORDER — SODIUM CHLORIDE 0.9 % IV SOLN
2.0000 g | INTRAVENOUS | Status: AC
  Administered 2019-11-15 – 2019-11-17 (×3): 2 g via INTRAVENOUS
  Filled 2019-11-15 (×4): qty 20

## 2019-11-15 MED ORDER — BUPIVACAINE HCL (PF) 0.25 % IJ SOLN
INTRAMUSCULAR | Status: DC | PRN
  Administered 2019-11-15: 10 mL

## 2019-11-15 MED ORDER — PROPOFOL 10 MG/ML IV BOLUS
INTRAVENOUS | Status: DC | PRN
  Administered 2019-11-15: 140 mg via INTRAVENOUS

## 2019-11-15 MED ORDER — LACTATED RINGERS IV BOLUS
1000.0000 mL | Freq: Once | INTRAVENOUS | Status: AC
  Administered 2019-11-15: 1000 mL via INTRAVENOUS

## 2019-11-15 MED ORDER — KCL IN DEXTROSE-NACL 20-5-0.45 MEQ/L-%-% IV SOLN
INTRAVENOUS | Status: DC
  Filled 2019-11-15 (×3): qty 1000

## 2019-11-15 MED ORDER — 0.9 % SODIUM CHLORIDE (POUR BTL) OPTIME
TOPICAL | Status: DC | PRN
  Administered 2019-11-15: 2000 mL

## 2019-11-15 MED ORDER — PROPOFOL 10 MG/ML IV BOLUS
INTRAVENOUS | Status: AC
  Filled 2019-11-15: qty 20

## 2019-11-15 MED ORDER — LACTATED RINGERS IV SOLN: INTRAVENOUS | Status: DC | PRN

## 2019-11-15 MED ORDER — PHENYLEPHRINE 40 MCG/ML (10ML) SYRINGE FOR IV PUSH (FOR BLOOD PRESSURE SUPPORT)
PREFILLED_SYRINGE | INTRAVENOUS | Status: DC | PRN
  Administered 2019-11-15: 80 ug via INTRAVENOUS
  Administered 2019-11-15: 160 ug via INTRAVENOUS
  Administered 2019-11-15: 80 ug via INTRAVENOUS
  Administered 2019-11-15: 200 ug via INTRAVENOUS
  Administered 2019-11-15 (×2): 80 ug via INTRAVENOUS

## 2019-11-15 MED ORDER — PHENYLEPHRINE 40 MCG/ML (10ML) SYRINGE FOR IV PUSH (FOR BLOOD PRESSURE SUPPORT)
PREFILLED_SYRINGE | INTRAVENOUS | Status: AC
  Filled 2019-11-15: qty 10

## 2019-11-15 MED ORDER — FENTANYL CITRATE (PF) 100 MCG/2ML IJ SOLN: 25.0000 ug | INTRAMUSCULAR | Status: DC | PRN

## 2019-11-15 MED ORDER — INSULIN ASPART 100 UNIT/ML ~~LOC~~ SOLN
0.0000 [IU] | Freq: Three times a day (TID) | SUBCUTANEOUS | Status: DC
  Administered 2019-11-15 (×2): 3 [IU] via SUBCUTANEOUS
  Administered 2019-11-16 (×3): 2 [IU] via SUBCUTANEOUS
  Administered 2019-11-17 – 2019-11-18 (×3): 1 [IU] via SUBCUTANEOUS
  Administered 2019-11-18: 2 [IU] via SUBCUTANEOUS
  Administered 2019-11-18 – 2019-11-19 (×2): 1 [IU] via SUBCUTANEOUS
  Administered 2019-11-19: 2 [IU] via SUBCUTANEOUS
  Administered 2019-11-19 – 2019-11-20 (×2): 1 [IU] via SUBCUTANEOUS
  Administered 2019-11-20 (×2): 2 [IU] via SUBCUTANEOUS
  Administered 2019-11-21 (×2): 1 [IU] via SUBCUTANEOUS
  Administered 2019-11-21: 2 [IU] via SUBCUTANEOUS
  Administered 2019-11-22 – 2019-11-23 (×6): 1 [IU] via SUBCUTANEOUS
  Administered 2019-11-24 (×2): 2 [IU] via SUBCUTANEOUS
  Administered 2019-11-25: 1 [IU] via SUBCUTANEOUS
  Administered 2019-11-25 – 2019-11-26 (×2): 2 [IU] via SUBCUTANEOUS
  Administered 2019-11-26 – 2019-11-29 (×9): 1 [IU] via SUBCUTANEOUS
  Administered 2019-11-30 (×2): 2 [IU] via SUBCUTANEOUS
  Administered 2019-11-30 – 2019-12-02 (×5): 1 [IU] via SUBCUTANEOUS
  Administered 2019-12-03 (×3): 2 [IU] via SUBCUTANEOUS
  Administered 2019-12-04: 1 [IU] via SUBCUTANEOUS
  Administered 2019-12-04: 2 [IU] via SUBCUTANEOUS

## 2019-11-15 MED ORDER — FENTANYL CITRATE (PF) 100 MCG/2ML IJ SOLN
INTRAMUSCULAR | Status: AC
  Filled 2019-11-15: qty 2

## 2019-11-15 MED ORDER — PHENYLEPHRINE HCL (PRESSORS) 10 MG/ML IV SOLN
INTRAVENOUS | Status: DC | PRN
  Administered 2019-11-15: 160 ug via INTRAVENOUS

## 2019-11-15 MED ORDER — FENTANYL CITRATE (PF) 100 MCG/2ML IJ SOLN
75.0000 ug | Freq: Once | INTRAMUSCULAR | Status: AC
  Administered 2019-11-15: 75 ug via INTRAVENOUS

## 2019-11-15 MED ORDER — DEXAMETHASONE SODIUM PHOSPHATE 10 MG/ML IJ SOLN
INTRAMUSCULAR | Status: DC | PRN
  Administered 2019-11-15: 10 mg via INTRAVENOUS

## 2019-11-15 MED ORDER — ONDANSETRON HCL 4 MG/2ML IJ SOLN
INTRAMUSCULAR | Status: DC | PRN
  Administered 2019-11-15: 4 mg via INTRAVENOUS

## 2019-11-15 SURGICAL SUPPLY — 66 items
ADAPTER CATH SYR TO TUBING 38M (ADAPTER) ×4 IMPLANT
ADPR CATH LL SYR 3/32 TPR (ADAPTER) ×3
BNDG CMPR 9X4 STRL LF SNTH (GAUZE/BANDAGES/DRESSINGS) ×3
BNDG COHESIVE 1X5 TAN STRL LF (GAUZE/BANDAGES/DRESSINGS) ×4 IMPLANT
BNDG ELASTIC 3X5.8 VLCR STR LF (GAUZE/BANDAGES/DRESSINGS) ×4 IMPLANT
BNDG ELASTIC 4X5.8 VLCR STR LF (GAUZE/BANDAGES/DRESSINGS) ×4 IMPLANT
BNDG ESMARK 4X9 LF (GAUZE/BANDAGES/DRESSINGS) ×4 IMPLANT
BNDG GAUZE ELAST 4 BULKY (GAUZE/BANDAGES/DRESSINGS) ×4 IMPLANT
CLAMP ADJ DIST RAD FIX 4.0MM (EXFIX) ×8 IMPLANT
CORD BIPOLAR FORCEPS 12FT (ELECTRODE) ×4 IMPLANT
COVER SURGICAL LIGHT HANDLE (MISCELLANEOUS) ×4 IMPLANT
CUFF TOURN SGL QUICK 18X4 (TOURNIQUET CUFF) ×4 IMPLANT
DECANTER SPIKE VIAL GLASS SM (MISCELLANEOUS) ×4 IMPLANT
DRAIN PENROSE 1/4X12 LTX STRL (WOUND CARE) IMPLANT
DRAPE OEC MINIVIEW 54X84 (DRAPES) ×4 IMPLANT
DRAPE SURG 17X23 STRL (DRAPES) ×4 IMPLANT
DRSG PAD ABDOMINAL 8X10 ST (GAUZE/BANDAGES/DRESSINGS) ×8 IMPLANT
DRSG XEROFORM 1X8 (GAUZE/BANDAGES/DRESSINGS) ×4 IMPLANT
GAUZE SPONGE 4X4 12PLY STRL (GAUZE/BANDAGES/DRESSINGS) ×8 IMPLANT
GAUZE XEROFORM 5X9 LF (GAUZE/BANDAGES/DRESSINGS) ×4 IMPLANT
GLOVE BIO SURGEON STRL SZ7.5 (GLOVE) ×8 IMPLANT
GLOVE BIOGEL PI IND STRL 8 (GLOVE) ×3 IMPLANT
GLOVE BIOGEL PI IND STRL 8.5 (GLOVE) ×6 IMPLANT
GLOVE BIOGEL PI INDICATOR 8 (GLOVE) ×1
GLOVE BIOGEL PI INDICATOR 8.5 (GLOVE) ×2
GLOVE BIOGEL PI ORTHO PRO SZ8 (GLOVE)
GLOVE PI ORTHO PRO STRL SZ8 (GLOVE) IMPLANT
GLOVE SURG ORTHO 8.0 STRL STRW (GLOVE) ×8 IMPLANT
GOWN STRL REUS W/ TWL LRG LVL3 (GOWN DISPOSABLE) ×9 IMPLANT
GOWN STRL REUS W/ TWL XL LVL3 (GOWN DISPOSABLE) ×15 IMPLANT
GOWN STRL REUS W/TWL LRG LVL3 (GOWN DISPOSABLE) ×12
GOWN STRL REUS W/TWL XL LVL3 (GOWN DISPOSABLE) ×20
IMMOBILIZER KNEE 20 (SOFTGOODS) ×4
IMMOBILIZER KNEE 20 THIGH 36 (SOFTGOODS) ×3 IMPLANT
KIT BASIN OR (CUSTOM PROCEDURE TRAY) ×4 IMPLANT
KIT TURNOVER KIT B (KITS) ×4 IMPLANT
LOOP VESSEL MAXI BLUE (MISCELLANEOUS) IMPLANT
LOOP VESSEL MINI RED (MISCELLANEOUS) ×4 IMPLANT
MANIFOLD NEPTUNE II (INSTRUMENTS) ×4 IMPLANT
NEEDLE HYPO 25X1 1.5 SAFETY (NEEDLE) IMPLANT
NS IRRIG 1000ML POUR BTL (IV SOLUTION) ×8 IMPLANT
PACK ORTHO EXTREMITY (CUSTOM PROCEDURE TRAY) ×4 IMPLANT
PAD ARMBOARD 7.5X6 YLW CONV (MISCELLANEOUS) ×8 IMPLANT
PAD CAST 4YDX4 CTTN HI CHSV (CAST SUPPLIES) ×3 IMPLANT
PADDING CAST COTTON 4X4 STRL (CAST SUPPLIES) ×4
ROD CARBON FIBER 8.0MMX200MM (Rod) ×4 IMPLANT
SCREW SHANZ 4.0X60MM (EXFIX) ×8 IMPLANT
SCREW SHANZ 4.0X80MM (EXFIX) ×8 IMPLANT
SOL PREP POV-IOD 4OZ 10% (MISCELLANEOUS) ×8 IMPLANT
SPLINT FINGER (SOFTGOODS) ×4 IMPLANT
SPLINT PLASTER EXTRA FAST 3X15 (CAST SUPPLIES) ×1
SPLINT PLASTER GYPS XFAST 3X15 (CAST SUPPLIES) ×3 IMPLANT
SPONGE LAP 4X18 RFD (DISPOSABLE) ×4 IMPLANT
SUT ETHILON 3 0 PS 1 (SUTURE) ×24 IMPLANT
SUT ETHILON 4 0 PS 2 18 (SUTURE) ×4 IMPLANT
SUT MNCRL AB 4-0 PS2 18 (SUTURE) ×4 IMPLANT
SWAB COLLECTION DEVICE MRSA (MISCELLANEOUS) IMPLANT
SWAB CULTURE ESWAB REG 1ML (MISCELLANEOUS) IMPLANT
SYR 20ML LL LF (SYRINGE) ×4 IMPLANT
SYR CONTROL 10ML LL (SYRINGE) IMPLANT
TOWEL GREEN STERILE (TOWEL DISPOSABLE) ×4 IMPLANT
TOWEL GREEN STERILE FF (TOWEL DISPOSABLE) ×4 IMPLANT
TUBE CONNECTING 12X1/4 (SUCTIONS) ×4 IMPLANT
UNDERPAD 30X36 HEAVY ABSORB (UNDERPADS AND DIAPERS) ×4 IMPLANT
WATER STERILE IRR 1000ML POUR (IV SOLUTION) ×4 IMPLANT
YANKAUER SUCT BULB TIP NO VENT (SUCTIONS) ×4 IMPLANT

## 2019-11-15 NOTE — Transfer of Care (Signed)
Immediate Anesthesia Transfer of Care Note  Patient: Kristen Aguirre  Procedure(s) Performed: IRRIGATION AND DEBRIDEMENT; EXPLORATION POSSIBLE REPAIR OF TENDON AND NERVE (Right ) DISTAL RADIAL FRACTURE -RIGHT  EXTERNAL FIXATION; IRRIGATION AND DEBRIDEMENT LEFT SMALL FINGER WITH CLOSURE LACERATION.  (Right Wrist) CLOSED REDUCTION HIP (Left Hip)  Patient Location: PACU  Anesthesia Type:General  Level of Consciousness: drowsy  Airway & Oxygen Therapy: Patient Spontanous Breathing and Patient connected to nasal cannula oxygen  Post-op Assessment: Report given to RN  Post vital signs: Reviewed and stable  Last Vitals:  Vitals Value Taken Time  BP 119/82 11/15/19 0908  Temp 36.7 C 11/15/19 0901  Pulse 103 11/15/19 0910  Resp 14 11/15/19 0908  SpO2 97 % 11/15/19 0910  Vitals shown include unvalidated device data.  Last Pain:  Vitals:   11/15/19 0908  TempSrc:   PainSc: 0-No pain         Complications: No complications documented.

## 2019-11-15 NOTE — Anesthesia Postprocedure Evaluation (Signed)
Anesthesia Post Note  Patient: Kristen Aguirre  Procedure(s) Performed: IRRIGATION AND DEBRIDEMENT; EXPLORATION POSSIBLE REPAIR OF TENDON AND NERVE (Right ) DISTAL RADIAL FRACTURE -RIGHT  EXTERNAL FIXATION; IRRIGATION AND DEBRIDEMENT LEFT SMALL FINGER WITH CLOSURE LACERATION.  (Right Wrist) CLOSED REDUCTION HIP (Left Hip)     Patient location during evaluation: PACU Anesthesia Type: General Level of consciousness: awake and alert Pain management: pain level controlled Vital Signs Assessment: post-procedure vital signs reviewed and stable Respiratory status: spontaneous breathing, nonlabored ventilation, respiratory function stable and patient connected to nasal cannula oxygen Cardiovascular status: blood pressure returned to baseline and stable Postop Assessment: no apparent nausea or vomiting Anesthetic complications: no   No complications documented.  Last Vitals:  Vitals:   11/15/19 1045 11/15/19 1100  BP:  118/63  Pulse: 91 88  Resp: 11 12  Temp:    SpO2: 100% 100%    Last Pain:  Vitals:   11/15/19 1120  TempSrc:   PainSc: 0-No pain                 Hawley Pavia DAVID
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
## 2019-11-15 NOTE — Progress Notes (Signed)
Subjective: Day of Surgery Procedure(s) (LRB): IRRIGATION AND DEBRIDEMENT; EXPLORATION POSSIBLE REPAIR OF TENDON AND NERVE (Right) DISTAL RADIAL FRACTURE -RIGHT  EXTERNAL FIXATION; IRRIGATION AND DEBRIDEMENT LEFT SMALL FINGER WITH CLOSURE LACERATION.  (Right) CLOSED REDUCTION HIP (Left) Patient reports pain in right forearm and wrist.  Improved with pain medication.  Interpreter is used for this visit.  Objective: Vital signs in last 24 hours: Temp:  [97.9 F (36.6 C)-98.7 F (37.1 C)] 98.6 F (37 C) (09/25 1600) Pulse Rate:  [63-115] 74 (09/25 1900) Resp:  [0-31] 8 (09/25 1900) BP: (60-147)/(43-91) 95/61 (09/25 1900) SpO2:  [93 %-100 %] 98 % (09/25 1900) Weight:  [74.8 kg-79.4 kg] 78.7 kg (09/25 1000)  Intake/Output from previous day: 09/24 0701 - 09/25 0700 In: 5200 [I.V.:4100; IV Piggyback:1100] Out: 200 [Urine:200] Intake/Output this shift: No intake/output data recorded.  Recent Labs    11/14/19 2118 11/14/19 2128 11/15/19 0145 11/15/19 0418  HGB 13.1 13.3 9.9* 9.6*   Recent Labs    11/15/19 0145 11/15/19 0418  WBC 4.2 4.5  RBC 3.12* 3.06*  HCT 30.4* 30.0*  PLT 143* 143*   Recent Labs    11/15/19 0145 11/15/19 0418  NA 141 138  K 4.0 4.2  CL 109 110  CO2 17* 19*  BUN <5* <5*  CREATININE 0.62 0.70  GLUCOSE 198* 200*  CALCIUM 7.0* 7.1*   Recent Labs    11/14/19 2118  INR 1.0    She is able to feel me touching her fingers though they are tingly.  Intact capillary refill.  She is able to flex and extend the digits.  She has pain when trying to flex the multiple into the palm but does have range of motion.  Compartments are soft.  Dressing was taken down.  New dressing placed with split Kerlix underneath.  The splint was replaced and wrapped lightly with the Ace bandage.  Assessment/Plan: Day of Surgery Procedure(s) (LRB): IRRIGATION AND DEBRIDEMENT; EXPLORATION POSSIBLE REPAIR OF TENDON AND NERVE (Right) DISTAL RADIAL FRACTURE -RIGHT  EXTERNAL  FIXATION; IRRIGATION AND DEBRIDEMENT LEFT SMALL FINGER WITH CLOSURE LACERATION.  (Right) CLOSED REDUCTION HIP (Left) Discussed with patient the operative course.  Decree sensation in the fingers can be due to the local injected after the case as well as due to neuropraxia from the displacement of the fracture initially.  I do not feel any tight compartments necessitating compartment release.  Encourage patient that this appears to be normal postsurgical and fracture pain.  Plan to have therapy make a thermoplastic splint with the forearm in slight pronation.  This may help with discomfort as well.      Betha Loa 11/15/2019, 7:19 PM

## 2019-11-15 NOTE — Progress Notes (Signed)
Pt. Noted to have pain. RN used Patent examiner. Pt. Described as right arm still numb but more pain happens when she moves her fingers. She described her arm as feeling tight.   MD Rudean Curt was made aware. RN gave pain medication and will continue to monitor patient at this time.
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
## 2019-11-15 NOTE — ED Notes (Signed)
Harriett Sine, daughter would like to be called with updates - [phone]

## 2019-11-15 NOTE — Anesthesia Preprocedure Evaluation (Signed)
Anesthesia Evaluation  Patient identified by MRN, date of birth, ID band Patient awake  Preop documentation limited or incomplete due to emergent nature of procedure.  Airway Mallampati: II       Dental   Pulmonary    breath sounds clear to auscultation       Cardiovascular  Rhythm:Regular Rate:Normal     Neuro/Psych    GI/Hepatic   Endo/Other    Renal/GU      Musculoskeletal   Abdominal   Peds  Hematology   Anesthesia Other Findings   Reproductive/Obstetrics                             Anesthesia Physical Anesthesia Plan  ASA: III  Anesthesia Plan: General   Post-op Pain Management:    Induction: Intravenous  PONV Risk Score and Plan: 3 and Ondansetron and Dexamethasone  Airway Management Planned: Oral ETT  Additional Equipment:   Intra-op Plan:   Post-operative Plan: Possible Post-op intubation/ventilation  Informed Consent: I have reviewed the patients History and Physical, chart, labs and discussed the procedure including the risks, benefits and alternatives for the proposed anesthesia with the patient or authorized representative who has indicated his/her understanding and acceptance.     Dental advisory given  Plan Discussed with: CRNA and Anesthesiologist  Anesthesia Plan Comments:         Anesthesia Quick Evaluation
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
## 2019-11-15 NOTE — Anesthesia Procedure Notes (Signed)
Procedure Name: Intubation Date/Time: 11/15/2019 5:25 AM Performed by: Molli Hazard, CRNA Pre-anesthesia Checklist: Patient identified, Emergency Drugs available, Suction available and Patient being monitored Patient Re-evaluated:Patient Re-evaluated prior to induction Oxygen Delivery Method: Circle system utilized Preoxygenation: Pre-oxygenation with 100% oxygen Induction Type: IV induction, Rapid sequence and Cricoid Pressure applied Laryngoscope Size: Glidescope Grade View: Grade I Tube type: Oral Tube size: 7.5 mm Number of attempts: 1 Airway Equipment and Method: Stylet Placement Confirmation: ETT inserted through vocal cords under direct vision,  positive ETCO2 and breath sounds checked- equal and bilateral Secured at: 23 cm Tube secured with: Tape Dental Injury: Teeth and Oropharynx as per pre-operative assessment

## 2019-11-15 NOTE — Progress Notes (Signed)
Assisted tele visit to patient with family member.  Cathleen Yagi Ann, RN  

## 2019-11-15 NOTE — Consult Note (Signed)
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 swelling. There is no acute displaced fracture or dislocation of the distal femur IMPRESSION: Persistent dislocation of the left hip. There is a displaced fracture fragment superior to the femoral head. Electronically Signed   By: Katherine Mantle M.D.   On: 11/14/2019 23:07     Review of systems not obtained due to patient factors.   Blood pressure 128/82, pulse (!) 106, temperature 98 F (36.7 C), temperature source Oral, resp. rate 15, height 5\' 1"  (1.549 m), weight 74.8 kg, SpO2 100 %.  General appearance: alert, cooperative and appears stated age Head: Blood on the forehead Neck: Wearing c-collar Extremities: Left upper extremity intact sensation and capillary refill all digits.  +epl/fpl/io.  Blood on the left small finger.  Right upper extremity decreased  sensation in fingertips.  Intact capillary refill.  Radial pulse difficult to palpate.  There is exposed bone through wound in the volar central portion of the wrist.  This appears to be the distal ulna.  Compartments are soft. Pulses: Left radial pulse intact.  Right radial pulse difficult to palpate Skin: Skin color, texture, turgor normal. No rashes or lesions Neurologic: Grossly normal Incision/Wound: As above  Assessment/Plan Comminuted open right distal radius fracture.  Recommend irrigation debridement of open fracture in the operating room with open reduction internal fixation versus external fixation of the fracture.  We also discussed exploration of the wound with repair of tendon artery nerve is necessary.  May possibly irrigate debride the wound and simply splint the wrist.  We discussed that she likely will require multiple surgeries for this significant injury to her wrist.  We discussed potential for damage to tendon artery nerve.  Risks, benefits and alternatives of surgery were discussed including risks of blood loss, infection, damage to nerves/vessels/tendons/ligament/bone, failure of surgery, need for additional surgery, complication with wound healing, stiffness.  She voiced understanding of these risks and elected to proceed.  Her daughter who speaks English was also present and agrees to proceed with the surgery.  Verbal consent was obtained from the patient and her daughter signed consent as well.  Language interpreter was used.   11/15/2019, 3:21 AM

## 2019-11-15 NOTE — Progress Notes (Signed)
Used cone translator iPad to converse with patient.  Pt. Stated that her right arm with external fixator, was numb. Pt. Stated that she could not feel fingers. She felt like her "arm fell asleep."  Her skin was warm and dry; pulses 2+. She was able to wiggle her fingers and stated she was not in pain.   RN contacted Betha Loa, MD to make aware.  RN will continue to monitor pt. At this time and notify MD of any changes.

## 2019-11-16 ENCOUNTER — Inpatient Hospital Stay (HOSPITAL_COMMUNITY): Payer: Self-pay

## 2019-11-16 DIAGNOSIS — U071 COVID-19: Secondary | ICD-10-CM | POA: Diagnosis not present

## 2019-11-16 LAB — COMPREHENSIVE METABOLIC PANEL
ALT: 72 U/L — ABNORMAL HIGH (ref 0–44)
AST: 117 U/L — ABNORMAL HIGH (ref 15–41)
Albumin: 2.5 g/dL — ABNORMAL LOW (ref 3.5–5.0)
Alkaline Phosphatase: 40 U/L (ref 38–126)
Anion gap: 9 (ref 5–15)
BUN: <5 mg/dL — ABNORMAL LOW (ref 6–20)
CO2: 25 mmol/L (ref 22–32)
Calcium: 7.6 mg/dL — ABNORMAL LOW (ref 8.9–10.3)
Chloride: 106 mmol/L (ref 98–111)
Creatinine, Ser: 0.56 mg/dL (ref 0.44–1.00)
GFR calc Af Amer: >60 mL/min (ref >60)
GFR calc non Af Amer: >60 mL/min (ref >60)
Glucose, Bld: 156 mg/dL — ABNORMAL HIGH (ref 70–99)
Potassium: 3.5 mmol/L (ref 3.5–5.1)
Sodium: 140 mmol/L (ref 135–145)
Total Bilirubin: 0.5 mg/dL (ref 0.3–1.2)
Total Protein: 5 g/dL — ABNORMAL LOW (ref 6.5–8.1)

## 2019-11-16 LAB — CBC
HCT: 22.9 % — ABNORMAL LOW (ref 36.0–46.0)
HCT: 22.9 % — ABNORMAL LOW (ref 36.0–46.0)
Hemoglobin: 7.5 g/dL — ABNORMAL LOW (ref 12.0–15.0)
Hemoglobin: 7.4 g/dL — ABNORMAL LOW (ref 12.0–15.0)
MCH: 32.1 pg (ref 26.0–34.0)
MCH: 31.8 pg (ref 26.0–34.0)
MCHC: 32.8 g/dL (ref 30.0–36.0)
MCHC: 32.3 g/dL (ref 30.0–36.0)
MCV: 97.9 fL (ref 80.0–100.0)
MCV: 98.3 fL (ref 80.0–100.0)
Platelets: DECREASED 10*3/uL (ref 150–400)
Platelets: 113 10*3/uL — ABNORMAL LOW (ref 150–400)
RBC: 2.34 MIL/uL — ABNORMAL LOW (ref 3.87–5.11)
RBC: 2.33 MIL/uL — ABNORMAL LOW (ref 3.87–5.11)
RDW: 13.1 % (ref 11.5–15.5)
RDW: 12.9 % (ref 11.5–15.5)
WBC: 5.3 10*3/uL (ref 4.0–10.5)
WBC: 5.1 10*3/uL (ref 4.0–10.5)
nRBC: 0 % (ref 0.0–0.2)
nRBC: 0 % (ref 0.0–0.2)

## 2019-11-16 LAB — GLUCOSE, CAPILLARY
Glucose-Capillary: 157 mg/dL — ABNORMAL HIGH (ref 70–99)
Glucose-Capillary: 169 mg/dL — ABNORMAL HIGH (ref 70–99)
Glucose-Capillary: 160 mg/dL — ABNORMAL HIGH (ref 70–99)
Glucose-Capillary: 173 mg/dL — ABNORMAL HIGH (ref 70–99)
Glucose-Capillary: 207 mg/dL — ABNORMAL HIGH (ref 70–99)

## 2019-11-16 LAB — CK: Total CK: 1984 U/L — ABNORMAL HIGH (ref 38–234)

## 2019-11-16 MED ORDER — EPINEPHRINE 0.3 MG/0.3ML IJ SOAJ
0.3000 mg | Freq: Once | INTRAMUSCULAR | Status: DC | PRN
  Filled 2019-11-16: qty 0.6

## 2019-11-16 MED ORDER — FLUTICASONE PROPIONATE 50 MCG/ACT NA SUSP
1.0000 | Freq: Every day | NASAL | Status: DC
  Administered 2019-11-16 – 2019-12-04 (×17): 1 via NASAL
  Filled 2019-11-16 (×2): qty 16

## 2019-11-16 MED ORDER — ALBUTEROL SULFATE HFA 108 (90 BASE) MCG/ACT IN AERS
2.0000 | INHALATION_SPRAY | Freq: Once | RESPIRATORY_TRACT | Status: DC | PRN
  Filled 2019-11-16: qty 6.7

## 2019-11-16 MED ORDER — SODIUM CHLORIDE 0.9 % IV SOLN
1200.0000 mg | Freq: Once | INTRAVENOUS | Status: AC
  Administered 2019-11-16: 1200 mg via INTRAVENOUS
  Filled 2019-11-16 (×2): qty 10

## 2019-11-16 MED ORDER — SODIUM CHLORIDE 0.9 % IV SOLN: INTRAVENOUS | Status: DC | PRN

## 2019-11-16 MED ORDER — METHYLPREDNISOLONE SODIUM SUCC 125 MG IJ SOLR: 125.0000 mg | Freq: Once | INTRAMUSCULAR | Status: DC | PRN

## 2019-11-16 MED ORDER — CEFAZOLIN SODIUM-DEXTROSE 2-4 GM/100ML-% IV SOLN
2.0000 g | Freq: Once | INTRAVENOUS | Status: AC
  Administered 2019-11-17: 2 g via INTRAVENOUS
  Filled 2019-11-16: qty 100

## 2019-11-16 MED ORDER — FAMOTIDINE IN NACL 20-0.9 MG/50ML-% IV SOLN
20.0000 mg | Freq: Once | INTRAVENOUS | Status: DC | PRN
  Filled 2019-11-16: qty 50

## 2019-11-16 MED ORDER — BENZONATATE 100 MG PO CAPS: 100.0000 mg | ORAL_CAPSULE | Freq: Three times a day (TID) | ORAL | Status: DC | PRN

## 2019-11-16 MED ORDER — DIPHENHYDRAMINE HCL 50 MG/ML IJ SOLN: 50.0000 mg | Freq: Once | INTRAMUSCULAR | Status: DC | PRN
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
## 2019-11-16 NOTE — Progress Notes (Signed)
Neurosurgery S: NAEs o/n  BP (!) 125/59   Pulse 80   Temp 99.1 F (37.3 C) (Oral)   Resp 15   Ht 5\' 1"  (1.549 m)   Wt 78.7 kg   SpO2 99%   BMI 32.78 kg/m  L knee immobilized, full strength distal LEs  Mild T12 compression fracture - TLSO brace to be worn when OOB.  Plan for f/u in clinic in 4 weeks with upright x-ray performed in clinic.

## 2019-11-16 NOTE — Progress Notes (Signed)
Orthopedic Tech Progress Note Patient Details:  Kristen Aguirre 1965-05-05 [id number]  Ortho Devices Type of Ortho Device: Abdominal binder Ortho Device/Splint Interventions: Ordered and delivered       [employer] 11/16/2019, 11:51 AM

## 2019-11-16 NOTE — Progress Notes (Signed)
Pt transferred to unit from ICU. Connected pt to tele, called CCMD, obtained vitals. Pt reported pain level 6/10, prn pain medication administered. Provided pt the opportunity to ask questions via interpreter, all questions answered.

## 2019-11-16 NOTE — Progress Notes (Signed)
Orthopedic Tech Progress Note Patient Details:  Kristen Aguirre 10-Dec-1965 [id number] Ordered TLSO Patient ID: Ranae Plumber, female   DOB: Mar 16, 1965, 54 y.o.   MRN: [id number]   Gerald Stabs 11/16/2019, 5:35 PM

## 2019-11-16 NOTE — Progress Notes (Signed)
Patient ID: Kristen Aguirre, female   DOB: 11/23/1965, 54 y.o.   MRN: [id number] Subjective: 1 Day Post-Op Procedure(s) (LRB): 1.  IRRIGATION AND DEBRIDEMENT; EXPLORATION POSSIBLE REPAIR OF TENDON AND NERVE (Right) 2.  DISTAL RADIAL FRACTURE -RIGHT  EXTERNAL FIXATION; IRRIGATION AND DEBRIDEMENT   3.  LEFT SMALL FINGER WITH CLOSURE LACERATION.     4.  CLOSED REDUCTION HIP (Left) for left hip fracture dislocation with femoral head fracture   PATIENT IN COVID UNIT - room air, no other other Covid related symptoms at this point [facility] SPEAKING    Patient reports pain as moderate.  Objective:   VITALS:   Vitals:   11/16/19 0500 11/16/19 0600  BP: 115/66 (!) 125/59  Pulse: 78 80  Resp: 14 15  Temp:    SpO2: 99% 99%    Neurovascular intact left lower extremity, intact sensibility and motor distally  LABS Recent Labs    11/14/19 2118 11/14/19 2118 11/14/19 2128 11/15/19 0145 11/15/19 0418  HGB 13.1   < > 13.3 9.9* 9.6*  HCT 40.6   < > 39.0 30.4* 30.0*  WBC 8.2  --   --  4.2 4.5  PLT 199  --   --  143* 143*   < > = values in this interval not displayed.    Recent Labs    11/14/19 2128 11/15/19 0145 11/15/19 0418  NA 140 141 138  K 4.0 4.0 4.2  BUN 4* <5* <5*  CREATININE 1.20* 0.62 0.70  GLUCOSE 158* 198* 200*    Recent Labs    11/14/19 2118  INR 1.0     Assessment/Plan: 1 Day Post-Op Procedure(s) (LRB): IRRIGATION AND DEBRIDEMENT; EXPLORATION POSSIBLE REPAIR OF TENDON AND NERVE (Right) DISTAL RADIAL FRACTURE -RIGHT  EXTERNAL FIXATION; IRRIGATION AND DEBRIDEMENT LEFT SMALL FINGER WITH CLOSURE LACERATION.  (Right) CLOSED REDUCTION HIP (Left)  Plan: Dr Aundria Rud performed closed reduction of her left hip and placed her in a knee immobilizer Plan is to transfer her care to the Ortho trauma service this week for definitive treatment
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
## 2019-11-17 ENCOUNTER — Encounter (HOSPITAL_COMMUNITY): Payer: Self-pay

## 2019-11-17 ENCOUNTER — Inpatient Hospital Stay (HOSPITAL_COMMUNITY): Payer: Self-pay

## 2019-11-17 ENCOUNTER — Inpatient Hospital Stay (HOSPITAL_COMMUNITY): Payer: Self-pay | Admitting: Anesthesiology

## 2019-11-17 ENCOUNTER — Encounter (HOSPITAL_COMMUNITY): Admission: EM | Disposition: A | Discharge status: Rehabilitation Facility | Payer: Self-pay | Source: Home / Self Care

## 2019-11-17 DIAGNOSIS — U071 COVID-19: Secondary | ICD-10-CM

## 2019-11-17 DIAGNOSIS — S73005A Unspecified dislocation of left hip, initial encounter: Secondary | ICD-10-CM | POA: Diagnosis not present

## 2019-11-17 HISTORY — PX: OPEN REDUCTION INTERNAL FIXATION ACETABULUM FRACTURE POSTERIOR: SHX6833

## 2019-11-17 LAB — VITAMIN D 25 HYDROXY (VIT D DEFICIENCY, FRACTURES): Vit D, 25-Hydroxy: 20.02 ng/mL — ABNORMAL LOW (ref 30–100)

## 2019-11-17 LAB — CBC
HCT: 21.5 % — ABNORMAL LOW (ref 36.0–46.0)
Hemoglobin: 7.1 g/dL — ABNORMAL LOW (ref 12.0–15.0)
MCH: 32.9 pg (ref 26.0–34.0)
MCHC: 33 g/dL (ref 30.0–36.0)
MCV: 99.5 fL (ref 80.0–100.0)
Platelets: 105 10*3/uL — ABNORMAL LOW (ref 150–400)
RBC: 2.16 MIL/uL — ABNORMAL LOW (ref 3.87–5.11)
RDW: 12.7 % (ref 11.5–15.5)
WBC: 5.3 10*3/uL (ref 4.0–10.5)
nRBC: 0 % (ref 0.0–0.2)

## 2019-11-17 LAB — BASIC METABOLIC PANEL
Anion gap: 7 (ref 5–15)
BUN: <5 mg/dL — ABNORMAL LOW (ref 6–20)
CO2: 26 mmol/L (ref 22–32)
Calcium: 7.3 mg/dL — ABNORMAL LOW (ref 8.9–10.3)
Chloride: 105 mmol/L (ref 98–111)
Creatinine, Ser: 0.58 mg/dL (ref 0.44–1.00)
GFR calc Af Amer: >60 mL/min (ref >60)
GFR calc non Af Amer: >60 mL/min (ref >60)
Glucose, Bld: 149 mg/dL — ABNORMAL HIGH (ref 70–99)
Potassium: 3.7 mmol/L (ref 3.5–5.1)
Sodium: 138 mmol/L (ref 135–145)

## 2019-11-17 LAB — GLUCOSE, CAPILLARY
Glucose-Capillary: 129 mg/dL — ABNORMAL HIGH (ref 70–99)
Glucose-Capillary: 145 mg/dL — ABNORMAL HIGH (ref 70–99)
Glucose-Capillary: 191 mg/dL — ABNORMAL HIGH (ref 70–99)
Glucose-Capillary: 202 mg/dL — ABNORMAL HIGH (ref 70–99)

## 2019-11-17 LAB — PREPARE RBC (CROSSMATCH)

## 2019-11-17 LAB — PROTIME-INR
INR: 1 (ref 0.8–1.2)
Prothrombin Time: 12.6 seconds (ref 11.4–15.2)

## 2019-11-17 SURGERY — OPEN REDUCTION INTERNAL FIXATION ACETABULUM FRACTURE POSTERIOR
Anesthesia: General | Site: Pelvis | Laterality: Left

## 2019-11-17 MED ORDER — BETHANECHOL CHLORIDE 10 MG PO TABS
10.0000 mg | ORAL_TABLET | Freq: Three times a day (TID) | ORAL | Status: DC
  Administered 2019-11-17 – 2019-11-21 (×14): 10 mg via ORAL
  Filled 2019-11-17 (×16): qty 1

## 2019-11-17 MED ORDER — CEFAZOLIN SODIUM-DEXTROSE 2-4 GM/100ML-% IV SOLN
2.0000 g | Freq: Three times a day (TID) | INTRAVENOUS | Status: AC
  Administered 2019-11-17 – 2019-11-18 (×3): 2 g via INTRAVENOUS
  Filled 2019-11-17 (×4): qty 100

## 2019-11-17 MED ORDER — OXYCODONE HCL 5 MG/5ML PO SOLN: 5.0000 mg | Freq: Once | ORAL | Status: DC | PRN

## 2019-11-17 MED ORDER — PROPOFOL 10 MG/ML IV BOLUS
INTRAVENOUS | Status: AC
  Filled 2019-11-17: qty 20

## 2019-11-17 MED ORDER — ONDANSETRON HCL 4 MG/2ML IJ SOLN: 4.0000 mg | Freq: Once | INTRAMUSCULAR | Status: DC | PRN

## 2019-11-17 MED ORDER — OXYCODONE HCL 5 MG PO TABS: 5.0000 mg | ORAL_TABLET | Freq: Once | ORAL | Status: DC | PRN

## 2019-11-17 MED ORDER — HYDROMORPHONE HCL 1 MG/ML IJ SOLN: 0.2500 mg | INTRAMUSCULAR | Status: DC | PRN

## 2019-11-17 MED ORDER — HYDROMORPHONE HCL 1 MG/ML IJ SOLN
INTRAMUSCULAR | Status: AC
  Filled 2019-11-17: qty 1

## 2019-11-17 MED ORDER — IBUPROFEN 200 MG PO TABS
400.0000 mg | ORAL_TABLET | Freq: Four times a day (QID) | ORAL | Status: AC | PRN
  Administered 2019-11-27 – 2019-11-29 (×2): 400 mg via ORAL
  Filled 2019-11-17: qty 2
  Filled 2019-11-17: qty 1
  Filled 2019-11-17: qty 2

## 2019-11-17 MED ORDER — FENTANYL CITRATE (PF) 100 MCG/2ML IJ SOLN
INTRAMUSCULAR | Status: DC | PRN
Start: 2019-11-17 — End: 2019-11-17
  Administered 2019-11-17: 100 ug via INTRAVENOUS
  Administered 2019-11-17 (×3): 50 ug via INTRAVENOUS

## 2019-11-17 MED ORDER — FENTANYL CITRATE (PF) 250 MCG/5ML IJ SOLN
INTRAMUSCULAR | Status: AC
  Filled 2019-11-17: qty 5

## 2019-11-17 MED ORDER — PROPOFOL 10 MG/ML IV BOLUS
INTRAVENOUS | Status: DC | PRN
  Administered 2019-11-17: 120 mg via INTRAVENOUS

## 2019-11-17 MED ORDER — SODIUM CHLORIDE 0.9 % IV SOLN: INTRAVENOUS | Status: DC

## 2019-11-17 MED ORDER — ROCURONIUM BROMIDE 100 MG/10ML IV SOLN
INTRAVENOUS | Status: DC | PRN
  Administered 2019-11-17: 20 mg via INTRAVENOUS
  Administered 2019-11-17: 50 mg via INTRAVENOUS
  Administered 2019-11-17: 10 mg via INTRAVENOUS

## 2019-11-17 MED ORDER — SODIUM CHLORIDE 0.9% IV SOLUTION: Freq: Once | INTRAVENOUS | Status: AC

## 2019-11-17 MED ORDER — EPHEDRINE SULFATE 50 MG/ML IJ SOLN
INTRAMUSCULAR | Status: DC | PRN
  Administered 2019-11-17: 10 mg via INTRAVENOUS

## 2019-11-17 MED ORDER — ONDANSETRON HCL 4 MG/2ML IJ SOLN
INTRAMUSCULAR | Status: AC
  Filled 2019-11-17: qty 2

## 2019-11-17 MED ORDER — MIDAZOLAM HCL 5 MG/5ML IJ SOLN
INTRAMUSCULAR | Status: DC | PRN
  Administered 2019-11-17: 2 mg via INTRAVENOUS

## 2019-11-17 MED ORDER — PHENYLEPHRINE HCL (PRESSORS) 10 MG/ML IV SOLN
INTRAVENOUS | Status: DC | PRN
  Administered 2019-11-17 (×2): 80 ug via INTRAVENOUS

## 2019-11-17 MED ORDER — HYDROMORPHONE HCL 1 MG/ML IJ SOLN
INTRAMUSCULAR | Status: AC
  Filled 2019-11-17: qty 0.5

## 2019-11-17 MED ORDER — LACTATED RINGERS IV SOLN: INTRAVENOUS | Status: DC | PRN

## 2019-11-17 MED ORDER — SUCCINYLCHOLINE 20MG/ML (10ML) SYRINGE FOR MEDFUSION PUMP - OPTIME
INTRAMUSCULAR | Status: DC | PRN
  Administered 2019-11-17: 100 mg via INTRAVENOUS

## 2019-11-17 MED ORDER — PHENYLEPHRINE HCL-NACL 10-0.9 MG/250ML-% IV SOLN
INTRAVENOUS | Status: DC | PRN
  Administered 2019-11-17: 25 ug/min via INTRAVENOUS

## 2019-11-17 MED ORDER — SODIUM CHLORIDE 0.9 % IV BOLUS
500.0000 mL | Freq: Once | INTRAVENOUS | Status: AC
  Administered 2019-11-17: 500 mL via INTRAVENOUS

## 2019-11-17 MED ORDER — LIDOCAINE 2% (20 MG/ML) 5 ML SYRINGE
INTRAMUSCULAR | Status: DC | PRN
  Administered 2019-11-17: 40 mg via INTRAVENOUS

## 2019-11-17 MED ORDER — DEXAMETHASONE SODIUM PHOSPHATE 4 MG/ML IJ SOLN
INTRAMUSCULAR | Status: DC | PRN
  Administered 2019-11-17: 4 mg via INTRAVENOUS

## 2019-11-17 MED ORDER — 0.9 % SODIUM CHLORIDE (POUR BTL) OPTIME
TOPICAL | Status: DC | PRN
  Administered 2019-11-17: 1000 mL

## 2019-11-17 MED ORDER — MIDAZOLAM HCL 2 MG/2ML IJ SOLN
INTRAMUSCULAR | Status: AC
  Filled 2019-11-17: qty 2

## 2019-11-17 MED ORDER — SODIUM CHLORIDE 0.9 % IV SOLN: INTRAVENOUS | Status: DC | PRN

## 2019-11-17 MED ORDER — SUGAMMADEX SODIUM 200 MG/2ML IV SOLN
INTRAVENOUS | Status: DC | PRN
  Administered 2019-11-17: 200 mg via INTRAVENOUS

## 2019-11-17 SURGICAL SUPPLY — 63 items
BLADE CLIPPER SURG (BLADE) IMPLANT
BNDG ELASTIC 4X5.8 VLCR STR LF (GAUZE/BANDAGES/DRESSINGS) ×2 IMPLANT
BNDG GAUZE ELAST 4 BULKY (GAUZE/BANDAGES/DRESSINGS) ×2 IMPLANT
BRUSH SCRUB EZ PLAIN DRY (MISCELLANEOUS) ×4 IMPLANT
COVER WAND RF STERILE (DRAPES) ×2 IMPLANT
DRAIN CHANNEL 15F RND FF W/TCR (WOUND CARE) IMPLANT
DRAPE C-ARM 42X72 X-RAY (DRAPES) ×2 IMPLANT
DRAPE C-ARMOR (DRAPES) ×2 IMPLANT
DRAPE LAPAROTOMY TRNSV 102X78 (DRAPES) ×2 IMPLANT
DRAPE SURG 17X23 STRL (DRAPES) ×2 IMPLANT
DRAPE U-SHAPE 47X51 STRL (DRAPES) ×2 IMPLANT
DRSG AQUACEL AG ADV 3.5X10 (GAUZE/BANDAGES/DRESSINGS) ×2 IMPLANT
DRSG MEPITEL 3X4 ME34 (GAUZE/BANDAGES/DRESSINGS) IMPLANT
DRSG MEPITEL 4X7.2 (GAUZE/BANDAGES/DRESSINGS) ×2 IMPLANT
DRSG PAD ABDOMINAL 8X10 ST (GAUZE/BANDAGES/DRESSINGS) ×2 IMPLANT
ELECT REM PT RETURN 9FT ADLT (ELECTROSURGICAL) ×2
ELECTRODE REM PT RTRN 9FT ADLT (ELECTROSURGICAL) ×1 IMPLANT
EVACUATOR SILICONE 100CC (DRAIN) IMPLANT
GAUZE SPONGE 4X4 12PLY STRL (GAUZE/BANDAGES/DRESSINGS) ×2 IMPLANT
GLOVE BIO SURGEON STRL SZ7.5 (GLOVE) ×2 IMPLANT
GLOVE BIO SURGEON STRL SZ8 (GLOVE) ×2 IMPLANT
GLOVE BIOGEL PI IND STRL 7.5 (GLOVE) ×1 IMPLANT
GLOVE BIOGEL PI IND STRL 8 (GLOVE) ×1 IMPLANT
GLOVE BIOGEL PI INDICATOR 7.5 (GLOVE) ×1
GLOVE BIOGEL PI INDICATOR 8 (GLOVE) ×1
GOWN STRL REUS W/ TWL LRG LVL3 (GOWN DISPOSABLE) ×2 IMPLANT
GOWN STRL REUS W/ TWL XL LVL3 (GOWN DISPOSABLE) ×1 IMPLANT
GOWN STRL REUS W/TWL LRG LVL3 (GOWN DISPOSABLE) ×4
GOWN STRL REUS W/TWL XL LVL3 (GOWN DISPOSABLE) ×2
KIT BASIN OR (CUSTOM PROCEDURE TRAY) ×2 IMPLANT
KIT TURNOVER KIT B (KITS) ×2 IMPLANT
MANIFOLD NEPTUNE II (INSTRUMENTS) IMPLANT
NS IRRIG 1000ML POUR BTL (IV SOLUTION) ×4 IMPLANT
PACK TOTAL JOINT (CUSTOM PROCEDURE TRAY) ×2 IMPLANT
PAD ARMBOARD 7.5X6 YLW CONV (MISCELLANEOUS) ×4 IMPLANT
PLATE ACET STRT 94.5M 8H (Plate) ×2 IMPLANT
PLATE SPRING 2H 3.5MM PELVIC (Plate) ×2 IMPLANT
PLATE SPRING 3.5MM 3H STRL (Plate) ×2 IMPLANT
RETRACTOR ONETRAX LX 135X30 (MISCELLANEOUS) ×2 IMPLANT
RETRIEVER SUT HEWSON (MISCELLANEOUS) ×2 IMPLANT
SCREW CORTEX ST MATTA 3.5X14 (Screw) ×2 IMPLANT
SCREW CORTEX ST MATTA 3.5X22MM (Screw) ×2 IMPLANT
SCREW CORTEX ST MATTA 3.5X24 (Screw) ×2 IMPLANT
SCREW CORTEX ST MATTA 3.5X28MM (Screw) ×4 IMPLANT
SCREW CORTEX ST MATTA 3.5X30MM (Screw) ×2 IMPLANT
SCREW CORTEX ST MATTA 3.5X32MM (Screw) ×2 IMPLANT
SPONGE LAP 18X18 RF (DISPOSABLE) ×2 IMPLANT
STAPLER VISISTAT 35W (STAPLE) ×2 IMPLANT
SUCTION FRAZIER HANDLE 10FR (MISCELLANEOUS) ×1
SUCTION TUBE FRAZIER 10FR DISP (MISCELLANEOUS) ×1 IMPLANT
SUT ETHILON 2 0 PSLX (SUTURE) ×4 IMPLANT
SUT FIBERWIRE #2 38 T-5 BLUE (SUTURE) ×4
SUT VIC AB 0 CT1 27 (SUTURE) ×2
SUT VIC AB 0 CT1 27XBRD ANBCTR (SUTURE) ×1 IMPLANT
SUT VIC AB 1 CT1 18XCR BRD 8 (SUTURE) ×2 IMPLANT
SUT VIC AB 1 CT1 8-18 (SUTURE) ×4
SUT VIC AB 2-0 CT1 27 (SUTURE) ×4
SUT VIC AB 2-0 CT1 TAPERPNT 27 (SUTURE) ×2 IMPLANT
SUTURE FIBERWR #2 38 T-5 BLUE (SUTURE) ×2 IMPLANT
TOWEL GREEN STERILE (TOWEL DISPOSABLE) ×4 IMPLANT
TOWEL GREEN STERILE FF (TOWEL DISPOSABLE) ×2 IMPLANT
TRAY FOLEY MTR SLVR 16FR STAT (SET/KITS/TRAYS/PACK) IMPLANT
WATER STERILE IRR 1000ML POUR (IV SOLUTION) IMPLANT
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
## 2019-11-17 NOTE — Anesthesia Postprocedure Evaluation (Signed)
Anesthesia Post Note  Patient: Kristen Aguirre  Procedure(s) Performed: OPEN REDUCTION INTERNAL FIXATION ACETABULUM FRACTURE POSTERIOR (Left Pelvis)     Patient location during evaluation: PACU Anesthesia Type: General Level of consciousness: awake and alert Pain management: pain level controlled Vital Signs Assessment: post-procedure vital signs reviewed and stable Respiratory status: spontaneous breathing, nonlabored ventilation and respiratory function stable Cardiovascular status: blood pressure returned to baseline and stable Postop Assessment: no apparent nausea or vomiting Anesthetic complications: no   No complications documented.  Last Vitals:  Vitals:   11/17/19 1709 11/17/19 1724  BP: (!) 118/53 (!) 114/46  Pulse: 79 79  Resp:    Temp:    SpO2: 96% 94%    Last Pain:  Vitals:   11/17/19 1724  TempSrc:   PainSc: Asleep                 Blakeleigh Domek,W. EDMOND

## 2019-11-17 NOTE — Progress Notes (Signed)
OT Note  Received order to fabricate Meunster splint for RUE. Pt has external fixator. Messaged Dr Merlyn Lot for clarification. Per nsg, pt to go to OR today for repair of L hip. Will follow.  Luisa Dago, OT/L   Acute OT Clinical Specialist Acute Rehabilitation Services Pager [phone] Office [phone]

## 2019-11-17 NOTE — Transfer of Care (Signed)
Immediate Anesthesia Transfer of Care Note  Patient: Eriana Suliman  Procedure(s) Performed: OPEN REDUCTION INTERNAL FIXATION ACETABULUM FRACTURE POSTERIOR (Left Pelvis)  Patient Location: OR 21  Anesthesia Type:General  Level of Consciousness: awake and patient cooperative  Airway & Oxygen Therapy: Patient Spontanous Breathing and Patient connected to face mask oxygen  Post-op Assessment: Report given to RN and Post -op Vital signs reviewed and stable  Post vital signs: Reviewed and stable  Last Vitals:  Vitals Value Taken Time  BP 109/62 11/17/19 1639  Temp 97.5   Pulse 91 11/17/19 1640  Resp 15   SpO2 94 % 11/17/19 1640  Vitals shown include unvalidated device data.  Last Pain:  Vitals:   11/17/19 1623  TempSrc:   PainSc: (P) 0-No pain      Patients Stated Pain Goal: 6 (11/16/19 1600)  Complications: No complications documented.
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
## 2019-11-17 NOTE — Progress Notes (Addendum)
Patient transported to OR. Attempted to call back daughter Harriett Sine, but did not answer.
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
## 2019-11-17 NOTE — Anesthesia Procedure Notes (Signed)
Procedure Name: Intubation Date/Time: 11/17/2019 12:56 PM Performed by: Tillman Abide, CRNA Pre-anesthesia Checklist: Patient identified, Emergency Drugs available, Suction available and Patient being monitored Patient Re-evaluated:Patient Re-evaluated prior to induction Oxygen Delivery Method: Circle System Utilized Preoxygenation: Pre-oxygenation with 100% oxygen Induction Type: IV induction Laryngoscope Size: Miller Grade View: Grade I Tube type: Oral Tube size: 7.5 mm Number of attempts: 1 Airway Equipment and Method: Stylet Placement Confirmation: ETT inserted through vocal cords under direct vision,  positive ETCO2 and breath sounds checked- equal and bilateral Secured at: 21 cm Tube secured with: Tape Dental Injury: Teeth and Oropharynx as per pre-operative assessment

## 2019-11-17 NOTE — Progress Notes (Signed)
   11/17/19 0000  Assess: MEWS Score  Temp (!) 100.5 F (38.1 C)  BP (!) 97/50  Pulse Rate 96  ECG Heart Rate 98  Resp 14  Level of Consciousness Alert  SpO2 96 %  O2 Device Nasal Cannula  Patient Activity (if Appropriate) In bed  O2 Flow Rate (L/min) 1 L/min

## 2019-11-17 NOTE — Progress Notes (Signed)
OT Cancellation Note  Patient Details Name: Janessa Mickle MRN: [id number] DOB: 1965-10-07   Cancelled Treatment:    Reason Eval/Treat Not Completed: (P) Other (comment) Pt in surgery. Will follow up and plan to splint tomorrow.   Chloe Bluett,HILLARY 11/17/2019, 1:33 PM  Luisa Dago, OT/L   Acute OT Clinical Specialist Acute Rehabilitation Services Pager [phone] Office [phone]
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
## 2019-11-17 NOTE — Progress Notes (Signed)
Patient returned from PACU sleepy but alert and oriented x4. VSS on 2L. IVF restarted. SCDs in place. Foley draining clear yellow urine. Ace wrap and knee immbolizer in place to left leg. Left hip dressing with two small areas of blood otherwise clean and intact. No c/o pain at this time. Will continue to monitor.

## 2019-11-18 ENCOUNTER — Encounter (HOSPITAL_COMMUNITY): Payer: Self-pay | Admitting: Orthopedic Surgery

## 2019-11-18 DIAGNOSIS — U071 COVID-19: Secondary | ICD-10-CM | POA: Diagnosis not present

## 2019-11-18 DIAGNOSIS — T796XXA Traumatic ischemia of muscle, initial encounter: Secondary | ICD-10-CM

## 2019-11-18 DIAGNOSIS — R7989 Other specified abnormal findings of blood chemistry: Secondary | ICD-10-CM

## 2019-11-18 LAB — COMPREHENSIVE METABOLIC PANEL
ALT: 50 U/L — ABNORMAL HIGH (ref 0–44)
AST: 66 U/L — ABNORMAL HIGH (ref 15–41)
Albumin: 2 g/dL — ABNORMAL LOW (ref 3.5–5.0)
Alkaline Phosphatase: 40 U/L (ref 38–126)
Anion gap: 9 (ref 5–15)
BUN: 5 mg/dL — ABNORMAL LOW (ref 6–20)
CO2: 24 mmol/L (ref 22–32)
Calcium: 7.7 mg/dL — ABNORMAL LOW (ref 8.9–10.3)
Chloride: 104 mmol/L (ref 98–111)
Creatinine, Ser: 0.48 mg/dL (ref 0.44–1.00)
GFR calc Af Amer: >60 mL/min (ref >60)
GFR calc non Af Amer: >60 mL/min (ref >60)
Glucose, Bld: 192 mg/dL — ABNORMAL HIGH (ref 70–99)
Potassium: 4.8 mmol/L (ref 3.5–5.1)
Sodium: 137 mmol/L (ref 135–145)
Total Bilirubin: 1.1 mg/dL (ref 0.3–1.2)
Total Protein: 4.8 g/dL — ABNORMAL LOW (ref 6.5–8.1)

## 2019-11-18 LAB — BPAM RBC
Blood Product Expiration Date: 202110022359
Blood Product Expiration Date: 202110152359
Blood Product Expiration Date: 202110202359
ISSUE DATE / TIME: 202109242256
ISSUE DATE / TIME: 202109270618
ISSUE DATE / TIME: 202109271105
Unit Type and Rh: 5100
Unit Type and Rh: 600
Unit Type and Rh: 6200

## 2019-11-18 LAB — TYPE AND SCREEN
ABO/RH(D): A POS
Antibody Screen: NEGATIVE
Unit division: 0
Unit division: 0
Unit division: 0

## 2019-11-18 LAB — CBC
HCT: 28 % — ABNORMAL LOW (ref 36.0–46.0)
Hemoglobin: 9.4 g/dL — ABNORMAL LOW (ref 12.0–15.0)
MCH: 31.6 pg (ref 26.0–34.0)
MCHC: 33.6 g/dL (ref 30.0–36.0)
MCV: 94.3 fL (ref 80.0–100.0)
Platelets: 94 10*3/uL — ABNORMAL LOW (ref 150–400)
RBC: 2.97 MIL/uL — ABNORMAL LOW (ref 3.87–5.11)
RDW: 15.3 % (ref 11.5–15.5)
WBC: 6.3 10*3/uL (ref 4.0–10.5)
nRBC: 0 % (ref 0.0–0.2)

## 2019-11-18 LAB — GLUCOSE, CAPILLARY
Glucose-Capillary: 169 mg/dL — ABNORMAL HIGH (ref 70–99)
Glucose-Capillary: 143 mg/dL — ABNORMAL HIGH (ref 70–99)
Glucose-Capillary: 146 mg/dL — ABNORMAL HIGH (ref 70–99)
Glucose-Capillary: 141 mg/dL — ABNORMAL HIGH (ref 70–99)

## 2019-11-18 LAB — CK: Total CK: 1414 U/L — ABNORMAL HIGH (ref 38–234)

## 2019-11-18 MED ORDER — BISACODYL 10 MG RE SUPP: 10.0000 mg | Freq: Every day | RECTAL | Status: DC | PRN

## 2019-11-18 MED ORDER — LORAZEPAM 1 MG PO TABS: 1.0000 mg | ORAL_TABLET | ORAL | Status: AC | PRN

## 2019-11-18 MED ORDER — OXYCODONE HCL 5 MG PO TABS
5.0000 mg | ORAL_TABLET | ORAL | Status: DC | PRN
  Administered 2019-11-18: 5 mg via ORAL
  Administered 2019-11-18 – 2019-11-27 (×27): 10 mg via ORAL
  Administered 2019-11-28: 5 mg via ORAL
  Administered 2019-11-28 (×2): 10 mg via ORAL
  Administered 2019-11-28 – 2019-11-29 (×3): 5 mg via ORAL
  Administered 2019-11-30 – 2019-12-04 (×12): 10 mg via ORAL
  Filled 2019-11-18: qty 1
  Filled 2019-11-18 (×4): qty 2
  Filled 2019-11-18: qty 1
  Filled 2019-11-18 (×9): qty 2
  Filled 2019-11-18: qty 1
  Filled 2019-11-18 (×26): qty 2
  Filled 2019-11-18: qty 1
  Filled 2019-11-18 (×4): qty 2

## 2019-11-18 MED ORDER — FOLIC ACID 1 MG PO TABS
1.0000 mg | ORAL_TABLET | Freq: Every day | ORAL | Status: DC
  Administered 2019-11-18 – 2019-12-04 (×16): 1 mg via ORAL
  Filled 2019-11-18 (×16): qty 1

## 2019-11-18 MED ORDER — THIAMINE HCL 100 MG/ML IJ SOLN
100.0000 mg | Freq: Every day | INTRAMUSCULAR | Status: DC
  Filled 2019-11-18 (×5): qty 2

## 2019-11-18 MED ORDER — POLYETHYLENE GLYCOL 3350 17 G PO PACK
17.0000 g | PACK | Freq: Every day | ORAL | Status: DC
  Administered 2019-11-18 – 2019-11-21 (×4): 17 g via ORAL
  Filled 2019-11-18 (×4): qty 1

## 2019-11-18 MED ORDER — LORAZEPAM 2 MG/ML IJ SOLN: 1.0000 mg | INTRAMUSCULAR | Status: AC | PRN

## 2019-11-18 MED ORDER — GLUCERNA SHAKE PO LIQD
237.0000 mL | Freq: Two times a day (BID) | ORAL | Status: DC
  Administered 2019-11-18 – 2019-12-03 (×20): 237 mL via ORAL

## 2019-11-18 MED ORDER — ADULT MULTIVITAMIN W/MINERALS CH
1.0000 | ORAL_TABLET | Freq: Every day | ORAL | Status: DC
  Administered 2019-11-18 – 2019-12-04 (×16): 1 via ORAL
  Filled 2019-11-18 (×16): qty 1

## 2019-11-18 MED ORDER — THIAMINE HCL 100 MG PO TABS
100.0000 mg | ORAL_TABLET | Freq: Every day | ORAL | Status: DC
  Administered 2019-11-18 – 2019-12-04 (×16): 100 mg via ORAL
  Filled 2019-11-18 (×16): qty 1

## 2019-11-18 MED ORDER — DOCUSATE SODIUM 100 MG PO CAPS
100.0000 mg | ORAL_CAPSULE | Freq: Two times a day (BID) | ORAL | Status: DC
  Administered 2019-11-18 – 2019-12-04 (×31): 100 mg via ORAL
  Filled 2019-11-18 (×32): qty 1

## 2019-11-18 MED ORDER — METHOCARBAMOL 500 MG PO TABS
500.0000 mg | ORAL_TABLET | Freq: Four times a day (QID) | ORAL | Status: DC
  Administered 2019-11-18 – 2019-11-24 (×25): 500 mg via ORAL
  Filled 2019-11-18 (×25): qty 1

## 2019-11-18 NOTE — Plan of Care (Signed)
Patient is currently resting in bed. C/o pain, given oxy and tylenol. Repositioned for comfort. Drsgs intact. Foley in place. Total feed due to patient not being able to use her arms to grab and pick up anything.   Patient would like to be in touch with her husband who was also hospital.   Problem: Education: Goal: Knowledge of risk factors and measures for prevention of condition will improve Outcome: Progressing   Problem: Coping: Goal: Psychosocial and spiritual needs will be supported Outcome: Progressing   Problem: Respiratory: Goal: Will maintain a patent airway Outcome: Progressing Goal: Complications related to the disease process, condition or treatment will be avoided or minimized Outcome: Progressing   Problem: Education: Goal: Knowledge of General Education information will improve Description: Including pain rating scale, medication(s)/side effects and non-pharmacologic comfort measures Outcome: Progressing   Problem: Health Behavior/Discharge Planning: Goal: Ability to manage health-related needs will improve Outcome: Progressing   Problem: Clinical Measurements: Goal: Ability to maintain clinical measurements within normal limits will improve Outcome: Progressing Goal: Will remain free from infection Outcome: Progressing Goal: Diagnostic test results will improve Outcome: Progressing Goal: Respiratory complications will improve Outcome: Progressing Goal: Cardiovascular complication will be avoided Outcome: Progressing   Problem: Activity: Goal: Risk for activity intolerance will decrease Outcome: Progressing   Problem: Nutrition: Goal: Adequate nutrition will be maintained Outcome: Progressing   Problem: Coping: Goal: Level of anxiety will decrease Outcome: Progressing   Problem: Elimination: Goal: Will not experience complications related to bowel motility Outcome: Progressing Goal: Will not experience complications related to urinary  retention Outcome: Progressing   Problem: Pain Managment: Goal: General experience of comfort will improve Outcome: Progressing   Problem: Safety: Goal: Ability to remain free from injury will improve Outcome: Progressing   Problem: Skin Integrity: Goal: Risk for impaired skin integrity will decrease Outcome: Progressing

## 2019-11-18 NOTE — Progress Notes (Signed)
OT Note  Modified [place] splint fabricated around external fixators to limit pronation/supination due to instability of DRUJ per Dr Merlyn Lot. Pt to wear splint at al times with the exception of dressing changes. Please notifiy OT @ [phone] of any difficulties with splint. Nursing staff educated regarding donning/doffing splint. PLEASE keep RUE/Hand elevated on 2 pillows with hand higher than elbow. Pt would benefit from IV being placed in different place so she can use her L hand to feed herself, etc. Full note to follow.  Luisa Dago, OT/L   Acute OT Clinical Specialist Acute Rehabilitation Services Pager [phone] Office [phone]
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
## 2019-11-19 ENCOUNTER — Encounter (HOSPITAL_COMMUNITY): Payer: Self-pay | Admitting: Orthopedic Surgery

## 2019-11-19 DIAGNOSIS — U071 COVID-19: Secondary | ICD-10-CM | POA: Diagnosis not present

## 2019-11-19 DIAGNOSIS — R7989 Other specified abnormal findings of blood chemistry: Secondary | ICD-10-CM | POA: Diagnosis not present

## 2019-11-19 DIAGNOSIS — S73005A Unspecified dislocation of left hip, initial encounter: Secondary | ICD-10-CM

## 2019-11-19 DIAGNOSIS — S32422A Displaced fracture of posterior wall of left acetabulum, initial encounter for closed fracture: Secondary | ICD-10-CM

## 2019-11-19 HISTORY — DX: Unspecified dislocation of left hip, initial encounter: S73.005A

## 2019-11-19 HISTORY — DX: Displaced fracture of posterior wall of left acetabulum, initial encounter for closed fracture: S32.422A

## 2019-11-19 LAB — GLUCOSE, CAPILLARY
Glucose-Capillary: 148 mg/dL — ABNORMAL HIGH (ref 70–99)
Glucose-Capillary: 147 mg/dL — ABNORMAL HIGH (ref 70–99)
Glucose-Capillary: 151 mg/dL — ABNORMAL HIGH (ref 70–99)
Glucose-Capillary: 144 mg/dL — ABNORMAL HIGH (ref 70–99)

## 2019-11-19 LAB — COMPREHENSIVE METABOLIC PANEL
ALT: 51 U/L — ABNORMAL HIGH (ref 0–44)
AST: 60 U/L — ABNORMAL HIGH (ref 15–41)
Albumin: 2.3 g/dL — ABNORMAL LOW (ref 3.5–5.0)
Alkaline Phosphatase: 52 U/L (ref 38–126)
Anion gap: 6 (ref 5–15)
BUN: 8 mg/dL (ref 6–20)
CO2: 28 mmol/L (ref 22–32)
Calcium: 7.8 mg/dL — ABNORMAL LOW (ref 8.9–10.3)
Chloride: 103 mmol/L (ref 98–111)
Creatinine, Ser: 0.5 mg/dL (ref 0.44–1.00)
GFR calc Af Amer: >60 mL/min (ref >60)
GFR calc non Af Amer: >60 mL/min (ref >60)
Glucose, Bld: 165 mg/dL — ABNORMAL HIGH (ref 70–99)
Potassium: 3.6 mmol/L (ref 3.5–5.1)
Sodium: 137 mmol/L (ref 135–145)
Total Bilirubin: 1.3 mg/dL — ABNORMAL HIGH (ref 0.3–1.2)
Total Protein: 5.3 g/dL — ABNORMAL LOW (ref 6.5–8.1)

## 2019-11-19 LAB — CBC
HCT: 28.5 % — ABNORMAL LOW (ref 36.0–46.0)
Hemoglobin: 9.5 g/dL — ABNORMAL LOW (ref 12.0–15.0)
MCH: 31.3 pg (ref 26.0–34.0)
MCHC: 33.3 g/dL (ref 30.0–36.0)
MCV: 93.8 fL (ref 80.0–100.0)
Platelets: 161 10*3/uL (ref 150–400)
RBC: 3.04 MIL/uL — ABNORMAL LOW (ref 3.87–5.11)
RDW: 14.4 % (ref 11.5–15.5)
WBC: 6.1 10*3/uL (ref 4.0–10.5)
nRBC: 0 % (ref 0.0–0.2)

## 2019-11-19 LAB — MAGNESIUM: Magnesium: 1.9 mg/dL (ref 1.7–2.4)

## 2019-11-19 LAB — PHOSPHORUS: Phosphorus: 2.7 mg/dL (ref 2.5–4.6)

## 2019-11-19 MED ORDER — ASCORBIC ACID 500 MG PO TABS
1000.0000 mg | ORAL_TABLET | Freq: Every day | ORAL | Status: DC
  Administered 2019-11-19 – 2019-12-04 (×15): 1000 mg via ORAL
  Filled 2019-11-19 (×16): qty 2

## 2019-11-19 MED ORDER — VITAMIN D 25 MCG (1000 UNIT) PO TABS
2000.0000 [IU] | ORAL_TABLET | Freq: Two times a day (BID) | ORAL | Status: DC
  Administered 2019-11-19 – 2019-12-04 (×30): 2000 [IU] via ORAL
  Filled 2019-11-19 (×31): qty 2

## 2019-11-19 MED ORDER — ENOXAPARIN SODIUM 30 MG/0.3ML ~~LOC~~ SOLN
30.0000 mg | Freq: Two times a day (BID) | SUBCUTANEOUS | Status: AC
  Administered 2019-11-19 – 2019-11-27 (×17): 30 mg via SUBCUTANEOUS
  Filled 2019-11-19 (×16): qty 0.3
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
## 2019-11-19 NOTE — Progress Notes (Signed)
Inpatient Rehab Admissions Coordinator Note:   Per therapy recommendations, pt was screened for CIR candidacy by Estill Dooms, PT, DPT.  Patients are eligible to be considered for admit to the [facility] when cleared from airborne precautions by Acute MD, or Infectious Disease MD.  Otherwise, they will need to be >20 days from their positive test with recovery/improvement in symptoms or 2 negative tests.    Please contact me, or place an IP Rehab MD Consult order if pt meets any of the above criteria.    Estill Dooms, PT, DPT [phone] 11/19/19 11:04 AM
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
## 2019-11-19 NOTE — Progress Notes (Signed)
Attempted to give report, RN unavailable. Call back number given to unit to call when receiving RN is ready

## 2019-11-19 NOTE — Progress Notes (Signed)
Pt arrived to unit via bed, transported by [employer] staff.  Admitted to telemetry, VS stable, pt denies pain.    Pt comfortable in bed with phone and call bell next to her.

## 2019-11-19 NOTE — Progress Notes (Signed)
Orthopedic Tech Progress Note Patient Details:  Lysette Lindenbaum April 18, 1965 [id number] Spoke with mrs. Tresa Endo about patient needing a TLSO BRACE. Called in order to [facility] for a STAT ORDER Patient ID: Ranae Plumber, female   DOB: [birth year], 54 y.o.   MRN: [id number]   Donald Pore 11/19/2019, 9:21 AM
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
## 2019-11-20 ENCOUNTER — Inpatient Hospital Stay (HOSPITAL_COMMUNITY): Payer: Self-pay

## 2019-11-20 DIAGNOSIS — U071 COVID-19: Secondary | ICD-10-CM | POA: Diagnosis not present

## 2019-11-20 LAB — COMPREHENSIVE METABOLIC PANEL
ALT: 48 U/L — ABNORMAL HIGH (ref 0–44)
AST: 51 U/L — ABNORMAL HIGH (ref 15–41)
Albumin: 2.3 g/dL — ABNORMAL LOW (ref 3.5–5.0)
Alkaline Phosphatase: 57 U/L (ref 38–126)
Anion gap: 9 (ref 5–15)
BUN: 6 mg/dL (ref 6–20)
CO2: 28 mmol/L (ref 22–32)
Calcium: 8 mg/dL — ABNORMAL LOW (ref 8.9–10.3)
Chloride: 99 mmol/L (ref 98–111)
Creatinine, Ser: 0.45 mg/dL (ref 0.44–1.00)
GFR calc Af Amer: >60 mL/min (ref >60)
GFR calc non Af Amer: >60 mL/min (ref >60)
Glucose, Bld: 172 mg/dL — ABNORMAL HIGH (ref 70–99)
Potassium: 3.6 mmol/L (ref 3.5–5.1)
Sodium: 136 mmol/L (ref 135–145)
Total Bilirubin: 1.4 mg/dL — ABNORMAL HIGH (ref 0.3–1.2)
Total Protein: 5.5 g/dL — ABNORMAL LOW (ref 6.5–8.1)

## 2019-11-20 LAB — GLUCOSE, CAPILLARY
Glucose-Capillary: 124 mg/dL — ABNORMAL HIGH (ref 70–99)
Glucose-Capillary: 174 mg/dL — ABNORMAL HIGH (ref 70–99)
Glucose-Capillary: 153 mg/dL — ABNORMAL HIGH (ref 70–99)
Glucose-Capillary: 183 mg/dL — ABNORMAL HIGH (ref 70–99)

## 2019-11-20 LAB — CBC
HCT: 30 % — ABNORMAL LOW (ref 36.0–46.0)
Hemoglobin: 9.8 g/dL — ABNORMAL LOW (ref 12.0–15.0)
MCH: 31.1 pg (ref 26.0–34.0)
MCHC: 32.7 g/dL (ref 30.0–36.0)
MCV: 95.2 fL (ref 80.0–100.0)
Platelets: 204 10*3/uL (ref 150–400)
RBC: 3.15 MIL/uL — ABNORMAL LOW (ref 3.87–5.11)
RDW: 14.4 % (ref 11.5–15.5)
WBC: 6.2 10*3/uL (ref 4.0–10.5)
nRBC: 0.5 % — ABNORMAL HIGH (ref 0.0–0.2)

## 2019-11-20 NOTE — Progress Notes (Signed)
Subjective: 3 Days Post-Op Procedure(s) (LRB): OPEN REDUCTION INTERNAL FIXATION ACETABULUM FRACTURE POSTERIOR (Left) Patient reports pain as controlled.  Video interpreter used.  No complaints.  Objective: Vital signs in last 24 hours: Temp:  [98.4 F (36.9 C)-98.6 F (37 C)] 98.6 F (37 C) (09/30 1512) Pulse Rate:  [68-75] 75 (09/30 1512) Resp:  [15-20] 20 (09/30 1512) BP: (135-158)/(50-58) 141/50 (09/30 1512) SpO2:  [95 %-97 %] 97 % (09/30 1512)  Intake/Output from previous day: 09/29 0701 - 09/30 0700 In: 360 [P.O.:360] Out: 1250 [Urine:1250] Intake/Output this shift: Total I/O In: 0  Out: 850 [Urine:850]  Recent Labs    11/18/19 0544 11/19/19 1200 11/20/19 1314  HGB 9.4* 9.5* 9.8*   Recent Labs    11/19/19 1200 11/20/19 1314  WBC 6.1 6.2  RBC 3.04* 3.15*  HCT 28.5* 30.0*  PLT 161 204   Recent Labs    11/19/19 1200 11/20/19 1314  NA 137 136  K 3.6 3.6  CL 103 99  CO2 28 28  BUN 8 6  CREATININE 0.50 0.45  GLUCOSE 165* 172*  CALCIUM 7.8* 8.0*   No results for input(s): LABPT, INR in the last 72 hours.  Intact sensation and capillary refill all digits.  Moving all digits without pain.  Wounds without erythema or drainage.  Drain in left small finger pulled.   Assessment/Plan: 3 Days Post-Op Procedure(s) (LRB): OPEN REDUCTION INTERNAL FIXATION ACETABULUM FRACTURE POSTERIOR (Left) Continue OT.  NWB right hand.  May use left hand with splint on.  No heavy gripping.  Will check XR.     Betha Loa 11/20/2019, 3:18 PM
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
## 2019-11-20 NOTE — Plan of Care (Signed)
  Problem: Respiratory: Goal: Will maintain a patent airway Outcome: Adequate for Discharge   Problem: Health Behavior/Discharge Planning: Goal: Ability to manage health-related needs will improve Outcome: Progressing   Problem: Activity: Goal: Risk for activity intolerance will decrease Outcome: Progressing

## 2019-11-21 DIAGNOSIS — U071 COVID-19: Secondary | ICD-10-CM | POA: Diagnosis not present

## 2019-11-21 LAB — GLUCOSE, CAPILLARY
Glucose-Capillary: 152 mg/dL — ABNORMAL HIGH (ref 70–99)
Glucose-Capillary: 139 mg/dL — ABNORMAL HIGH (ref 70–99)
Glucose-Capillary: 143 mg/dL — ABNORMAL HIGH (ref 70–99)
Glucose-Capillary: 171 mg/dL — ABNORMAL HIGH (ref 70–99)

## 2019-11-21 MED ORDER — MAGNESIUM CITRATE PO SOLN
0.5000 | Freq: Once | ORAL | Status: AC
  Administered 2019-11-21: 0.5 via ORAL
  Filled 2019-11-21: qty 296

## 2019-11-21 MED ORDER — HYDROMORPHONE HCL 1 MG/ML IJ SOLN
0.5000 mg | Freq: Four times a day (QID) | INTRAMUSCULAR | Status: DC | PRN
  Administered 2019-11-21 – 2019-11-22 (×2): 0.5 mg via INTRAVENOUS
  Filled 2019-11-21 (×2): qty 0.5

## 2019-11-21 MED ORDER — HYDROMORPHONE HCL 1 MG/ML IJ SOLN: 0.5000 mg | Freq: Four times a day (QID) | INTRAMUSCULAR | Status: DC | PRN
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
## 2019-11-22 DIAGNOSIS — U071 COVID-19: Secondary | ICD-10-CM | POA: Diagnosis not present

## 2019-11-22 LAB — GLUCOSE, CAPILLARY
Glucose-Capillary: 127 mg/dL — ABNORMAL HIGH (ref 70–99)
Glucose-Capillary: 143 mg/dL — ABNORMAL HIGH (ref 70–99)
Glucose-Capillary: 130 mg/dL — ABNORMAL HIGH (ref 70–99)
Glucose-Capillary: 207 mg/dL — ABNORMAL HIGH (ref 70–99)

## 2019-11-22 MED ORDER — POLYETHYLENE GLYCOL 3350 17 G PO PACK
17.0000 g | PACK | Freq: Two times a day (BID) | ORAL | Status: DC
  Administered 2019-11-22 – 2019-12-04 (×22): 17 g via ORAL
  Filled 2019-11-22 (×24): qty 1

## 2019-11-22 MED ORDER — HYDROMORPHONE HCL 1 MG/ML IJ SOLN
0.5000 mg | Freq: Two times a day (BID) | INTRAMUSCULAR | Status: DC | PRN
  Administered 2019-11-23: 0.5 mg via INTRAVENOUS
  Filled 2019-11-22 (×2): qty 0.5

## 2019-11-22 MED ORDER — BISACODYL 10 MG RE SUPP
10.0000 mg | Freq: Once | RECTAL | Status: AC
  Administered 2019-11-22: 10 mg via RECTAL
  Filled 2019-11-22: qty 1

## 2019-11-22 MED ORDER — BETHANECHOL CHLORIDE 5 MG PO TABS
5.0000 mg | ORAL_TABLET | Freq: Three times a day (TID) | ORAL | Status: DC
  Administered 2019-11-22 – 2019-11-25 (×12): 5 mg via ORAL
  Filled 2019-11-22 (×13): qty 1
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
## 2019-11-23 DIAGNOSIS — U071 COVID-19: Secondary | ICD-10-CM | POA: Diagnosis not present

## 2019-11-23 LAB — GLUCOSE, CAPILLARY
Glucose-Capillary: 144 mg/dL — ABNORMAL HIGH (ref 70–99)
Glucose-Capillary: 146 mg/dL — ABNORMAL HIGH (ref 70–99)
Glucose-Capillary: 147 mg/dL — ABNORMAL HIGH (ref 70–99)
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
## 2019-11-24 ENCOUNTER — Inpatient Hospital Stay (HOSPITAL_COMMUNITY): Payer: Self-pay

## 2019-11-24 DIAGNOSIS — S73005A Unspecified dislocation of left hip, initial encounter: Secondary | ICD-10-CM

## 2019-11-24 LAB — GLUCOSE, CAPILLARY
Glucose-Capillary: 136 mg/dL — ABNORMAL HIGH (ref 70–99)
Glucose-Capillary: 179 mg/dL — ABNORMAL HIGH (ref 70–99)
Glucose-Capillary: 157 mg/dL — ABNORMAL HIGH (ref 70–99)

## 2019-11-24 MED ORDER — METHOCARBAMOL 500 MG PO TABS
750.0000 mg | ORAL_TABLET | Freq: Four times a day (QID) | ORAL | Status: DC
  Administered 2019-11-24 – 2019-12-04 (×39): 750 mg via ORAL
  Filled 2019-11-24 (×39): qty 2
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
## 2019-11-24 NOTE — Progress Notes (Signed)
Bilateral lower extremity venous duplex has been completed. Preliminary results can be found in CV Proc through chart review.   11/24/19 5:23 PM Olen Cordial RVT
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
## 2019-11-24 NOTE — Progress Notes (Signed)
Inpatient Rehab Admissions:  Inpatient Rehab Consult received.  Per Dr. Elvera Lennox, pt okay to be off isolation on 10/6.  I will f/u with patient at that time.   Signed: Estill Dooms, PT, DPT Admissions Coordinator [phone] 11/24/19  10:03 AM

## 2019-11-25 LAB — GLUCOSE, CAPILLARY
Glucose-Capillary: 119 mg/dL — ABNORMAL HIGH (ref 70–99)
Glucose-Capillary: 160 mg/dL — ABNORMAL HIGH (ref 70–99)
Glucose-Capillary: 122 mg/dL — ABNORMAL HIGH (ref 70–99)
Glucose-Capillary: 194 mg/dL — ABNORMAL HIGH (ref 70–99)
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
## 2019-11-25 NOTE — Progress Notes (Signed)
Inpatient Rehab Admissions:  Attempted to call pt's daughter, no answer and no option to leave voicemail.  Will continue efforts.   Signed: Estill Dooms, PT, DPT Admissions Coordinator [phone] 11/25/19  2:52 PM
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
## 2019-11-26 LAB — GLUCOSE, CAPILLARY
Glucose-Capillary: 130 mg/dL — ABNORMAL HIGH (ref 70–99)
Glucose-Capillary: 152 mg/dL — ABNORMAL HIGH (ref 70–99)
Glucose-Capillary: 144 mg/dL — ABNORMAL HIGH (ref 70–99)
Glucose-Capillary: 147 mg/dL — ABNORMAL HIGH (ref 70–99)
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
## 2019-11-26 NOTE — Progress Notes (Signed)
Patient daughter called nursing station requesting for nurse to call her with updates. RN called patient daughter x2 but no answer.
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
## 2019-11-27 LAB — GLUCOSE, CAPILLARY
Glucose-Capillary: 154 mg/dL — ABNORMAL HIGH (ref 70–99)
Glucose-Capillary: 136 mg/dL — ABNORMAL HIGH (ref 70–99)
Glucose-Capillary: 142 mg/dL — ABNORMAL HIGH (ref 70–99)
Glucose-Capillary: 118 mg/dL — ABNORMAL HIGH (ref 70–99)
Glucose-Capillary: 158 mg/dL — ABNORMAL HIGH (ref 70–99)

## 2019-11-27 MED ORDER — APIXABAN 2.5 MG PO TABS
2.5000 mg | ORAL_TABLET | Freq: Two times a day (BID) | ORAL | Status: DC
  Administered 2019-11-28 – 2019-12-01 (×7): 2.5 mg via ORAL
  Filled 2019-11-27 (×7): qty 1
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
## 2019-11-27 NOTE — Progress Notes (Signed)
Inpatient Rehab Admissions Coordinator:   I met with patient at bedside with interpreter present.  Pt rates pain 5/10, an RN in to provide medication.  Pt states she will plan to d/c home from CIR to her brothers home, and he and her daughter will be able to provide assistance at discharge.  I've not been able to reach pt's daughter yet, but will continue to try.  Pt's brother plans to build a ramp for home access.  We discussed cost of rehab as well as medicaid screening, payment plans, etc.  Pt agreeable to cost.  We also discussed needing timeline for removal of RUE ex-fix.  If plan for definitive fixation in the next few days, would likely wait until post-op for CIR.  Pt states her daughter should be coming up to visit today so I will try to speak with her.  Will continue to follow for timing of possible rehab admission pending bed availability and plan for definitive fixation.   Shann Medal, PT, DPT Admissions Coordinator [phone] 11/27/19  12:11 PM

## 2019-11-27 NOTE — Plan of Care (Signed)
  Problem: Education: Goal: Knowledge of risk factors and measures for prevention of condition will improve Outcome: Progressing   Problem: Coping: Goal: Psychosocial and spiritual needs will be supported Outcome: Progressing   Problem: Health Behavior/Discharge Planning: Goal: Ability to manage health-related needs will improve Outcome: Progressing   Problem: Clinical Measurements: Goal: Ability to maintain clinical measurements within normal limits will improve Outcome: Progressing
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
## 2019-11-28 ENCOUNTER — Other Ambulatory Visit: Payer: Self-pay | Admitting: Orthopedic Surgery

## 2019-11-28 LAB — GLUCOSE, CAPILLARY
Glucose-Capillary: 125 mg/dL — ABNORMAL HIGH (ref 70–99)
Glucose-Capillary: 132 mg/dL — ABNORMAL HIGH (ref 70–99)
Glucose-Capillary: 121 mg/dL — ABNORMAL HIGH (ref 70–99)
Glucose-Capillary: 151 mg/dL — ABNORMAL HIGH (ref 70–99)

## 2019-11-28 LAB — CBC
HCT: 33.8 % — ABNORMAL LOW (ref 36.0–46.0)
Hemoglobin: 11 g/dL — ABNORMAL LOW (ref 12.0–15.0)
MCH: 31.3 pg (ref 26.0–34.0)
MCHC: 32.5 g/dL (ref 30.0–36.0)
MCV: 96 fL (ref 80.0–100.0)
Platelets: 325 10*3/uL (ref 150–400)
RBC: 3.52 MIL/uL — ABNORMAL LOW (ref 3.87–5.11)
RDW: 15.2 % (ref 11.5–15.5)
WBC: 7.8 10*3/uL (ref 4.0–10.5)
nRBC: 0 % (ref 0.0–0.2)
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
## 2019-11-28 NOTE — Plan of Care (Signed)
  Problem: Education: Goal: Knowledge of risk factors and measures for prevention of condition will improve Outcome: Progressing   Problem: Coping: Goal: Psychosocial and spiritual needs will be supported Outcome: Progressing   Problem: Respiratory: Goal: Will maintain a patent airway Outcome: Progressing Goal: Complications related to the disease process, condition or treatment will be avoided or minimized Outcome: Progressing   Problem: Education: Goal: Knowledge of risk factors and measures for prevention of condition will improve 11/28/2019 2256 by Maxwell Marion, RN Outcome: Progressing 11/28/2019 2256 by Maxwell Marion, RN Outcome: Progressing   Problem: Coping: Goal: Psychosocial and spiritual needs will be supported 11/28/2019 2256 by Maxwell Marion, RN Outcome: Progressing 11/28/2019 2256 by Maxwell Marion, RN Outcome: Progressing   Problem: Respiratory: Goal: Will maintain a patent airway 11/28/2019 2256 by Maxwell Marion, RN Outcome: Progressing 11/28/2019 2256 by Maxwell Marion, RN Outcome: Progressing Goal: Complications related to the disease process, condition or treatment will be avoided or minimized 11/28/2019 2256 by Maxwell Marion, RN Outcome: Progressing 11/28/2019 2256 by Maxwell Marion, RN Outcome: Progressing
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
## 2019-11-28 NOTE — Plan of Care (Signed)
  Problem: Education: Goal: Knowledge of risk factors and measures for prevention of condition will improve Outcome: Progressing   Problem: Coping: Goal: Psychosocial and spiritual needs will be supported Outcome: Progressing   Problem: Respiratory: Goal: Will maintain a patent airway Outcome: Progressing   
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
## 2019-11-29 LAB — GLUCOSE, CAPILLARY
Glucose-Capillary: 119 mg/dL — ABNORMAL HIGH (ref 70–99)
Glucose-Capillary: 134 mg/dL — ABNORMAL HIGH (ref 70–99)
Glucose-Capillary: 135 mg/dL — ABNORMAL HIGH (ref 70–99)
Glucose-Capillary: 155 mg/dL — ABNORMAL HIGH (ref 70–99)

## 2019-11-29 NOTE — Plan of Care (Signed)
  Problem: Education: Goal: Knowledge of risk factors and measures for prevention of condition will improve Outcome: Progressing   Problem: Coping: Goal: Psychosocial and spiritual needs will be supported Outcome: Progressing   Problem: Respiratory: Goal: Will maintain a patent airway Outcome: Progressing Goal: Complications related to the disease process, condition or treatment will be avoided or minimized Outcome: Progressing   
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
## 2019-11-30 ENCOUNTER — Encounter (HOSPITAL_COMMUNITY): Payer: Self-pay

## 2019-11-30 LAB — GLUCOSE, CAPILLARY
Glucose-Capillary: 152 mg/dL — ABNORMAL HIGH (ref 70–99)
Glucose-Capillary: 133 mg/dL — ABNORMAL HIGH (ref 70–99)
Glucose-Capillary: 157 mg/dL — ABNORMAL HIGH (ref 70–99)
Glucose-Capillary: 127 mg/dL — ABNORMAL HIGH (ref 70–99)
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
## 2019-12-01 LAB — GLUCOSE, CAPILLARY
Glucose-Capillary: 124 mg/dL — ABNORMAL HIGH (ref 70–99)
Glucose-Capillary: 140 mg/dL — ABNORMAL HIGH (ref 70–99)
Glucose-Capillary: 131 mg/dL — ABNORMAL HIGH (ref 70–99)
Glucose-Capillary: 167 mg/dL — ABNORMAL HIGH (ref 70–99)
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
## 2019-12-01 NOTE — Discharge Instructions (Signed)
Apixaban tabletas Qu es este medicamento? El APIXABAN es un anticoagulante (diluyente de la [place]). Se utiliza para reducir la posibilidad de derrame cerebral en los pacientes con una afeccin mdica llamada fibrilacin auricular. Se utiliza tambin para tratar o prevenir cogulos sanguneos en los pulmones o las venas. Este medicamento puede ser utilizado para otros usos; si tiene alguna pregunta consulte con su proveedor de atencin mdica o con su farmacutico. MARCAS COMUNES: Eliquis  Qu le debo informar a mi profesional de la salud antes de tomar este medicamento? Necesitan saber si usted presenta alguno de los siguientes problemas o situaciones: sndrome de anticuerpo antifosfolpido trastornos de sangrado hemorragia cerebral sangre en las heces (heces negras o de aspecto alquitranado) o si hay sangre en su vmito antecedentes de cogulos sanguneos antecedentes de hemorragia estomacal enfermedad renal enfermedad heptica vlvula cardiaca mecnica una reaccin alrgica o inusual al apixabn, a otros medicamentos, alimentos, colorantes o conservantes si est embarazada o buscando quedar embarazada si est amamantando a un beb  Cmo debo utilizar este medicamento? Tome una [employer] veces al dia por va oral con un vaso de agua. Siga las instrucciones de la etiqueta del [place]. Puede tomarlo con o sin alimentos. Si el Social worker, tmelo con alimentos. Tome su medicamento a intervalos regulares. No lo tome con una frecuencia mayor a la indicada. No deje de tomarlo, excepto si as lo indica su mdico. Dejar de tomar este medicamento podra aumentar su riesgo de tener un cogulo sanguneo. Asegrese de volver a surtir su receta antes de quedarse sin medicamento. Hable con su pediatra para informarse acerca del uso de este medicamento en nios. Puede requerir atencin especial. Sobredosis: Pngase en contacto inmediatamente con un centro toxicolgico o una  sala de urgencia si usted cree que haya tomado demasiado medicamento.  ATENCIN: [employer] es solo para usted. No comparta este medicamento con nadie.  Qu sucede si me olvido de una dosis? Si se olvida una dosis, tmela lo antes posible. Si es casi la hora de la prxima dosis, tome slo esa dosis. No tome dosis adicionales o dobles.  Qu puede interactuar con este medicamento? Esta medicina puede interactuar con los siguientes medicamentos: aspirina o medicamentos tipo aspirina ciertos medicamentos para infecciones micticas tales como quetoconazol e itraconazol ciertos medicamentos para convulsiones tales como carbamazepina y fenitona ciertos medicamentos que tratan o previenen cogulos sanguneos, como warfarina, enoxaparina y dalteparina claritromicina los [place], medicamentos para el dolor o inflamacin, como ibuprofeno o naproxeno rifampicina ritonavir hierba de [place] Puede ser que esta lista no menciona todas las posibles interacciones. Informe a su profesional de [employer] de [employer] productos a base de hierbas, medicamentos de [place] o suplementos nutritivos que est tomando. Si usted fuma, consume bebidas alcohlicas o si utiliza drogas ilegales, indqueselo tambin a su profesional de [employer]. Algunas sustancias pueden interactuar con su medicamento.  A qu debo estar atento al usar [employer]? Visite a su profesional de la salud para que revise regularmente su evolucin. Usted podra necesitar realizarse [employer] de sangre mientras est usando [place]. Se supervisar su estado de salud atentamente mientras reciba este medicamento. Es importante no faltar a ninguna cita. Evite los deportes y actividades que podran causar una lesin mientras est usando este medicamento. Las lesiones o cadas graves pueden causar un sangrado oculto. Tenga cuidado al usar herramientas filosas o cuchillos. Considere usar Therapist, art. Tenga especial cuidado al  [employer] o usar hilo dental. Informe cualquier lesin,  hematoma, o puntos rojos en la piel a su profesional de [employer]. Si va a someterse a una operacin o a otro procedimiento, informe a su profesional de la salud que est tomando [employer]. Use un brazalete o una cadena de identificacin mdica. Lleve con usted una tarjeta que describa su enfermedad, detalles de su medicamento y horario de dosis.  Qu efectos secundarios puedo tener al [employer] este medicamento? Efectos secundarios que debe informar a su mdico o a Producer, television/film/video de la salud tan pronto como sea posible: Therapist, art, como erupcin cutnea, comezn/picazn o urticaria, e hinchazn de la cara, los labios o la lengua signos y sntomas de Landscape architect, tales como heces con sangre o de color negro y aspecto alquitranado; [place] de color rojo o marrn oscuro; escupir sangre o material marrn que tiene el aspecto de granos de caf molido; Regulatory affairs officer rojas en la piel; sangrado o moretones inusuales en los ojos, las encas o la nariz signos y sntomas de un cogulo sanguneo, tales como dolor en el pecho; falta de aire; dolor, hinchazn o calor en la pierna signos y sntomas de un accidente cerebrovascular, tales como cambios en la visin; confusin; dificultad para hablar o entender; dolores de cabeza severos; entumecimiento o debilidad repentina de la cara, el brazo o la pierna; problemas al caminar; Psychiatrist; prdida de la coordinacin Puede ser que esta lista no menciona todos los posibles efectos secundarios. Comunquese a su mdico por asesoramiento mdico [employer]. Usted puede informar los efectos secundarios a la FDA por telfono al 1-800-FDA-1088.  Dnde debo guardar mi medicina? Mantngala fuera del alcance de los nios. Gurdela a [employer], entre 20 y 25 grados C (58 y 62 grados F). Deseche todo el medicamento que no haya utilizado, despus de la fecha de vencimiento. ATENCIN:  Este folleto es un resumen. Puede ser que no cubra toda la posible informacin. Si usted tiene preguntas acerca de esta medicina, consulte con su mdico, su farmacutico o su profesional de Radiographer, therapeutic.  2020 Elsevier/Gold Standard (2018-01-14 00:00:00)
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
## 2019-12-01 NOTE — Progress Notes (Signed)
Inpatient Rehabilitation Admissions Coordinator  We will follow up postoperatively to assist with planning dispo.  Ottie Glazier, RN, MSN Rehab Admissions Coordinator [phone] 12/01/2019 3:29 PM
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
## 2019-12-02 ENCOUNTER — Encounter (HOSPITAL_COMMUNITY): Payer: Self-pay

## 2019-12-02 ENCOUNTER — Inpatient Hospital Stay (HOSPITAL_COMMUNITY): Payer: Self-pay

## 2019-12-02 ENCOUNTER — Encounter (HOSPITAL_COMMUNITY): Admission: EM | Disposition: A | Discharge status: Rehabilitation Facility | Payer: Self-pay | Source: Home / Self Care

## 2019-12-02 ENCOUNTER — Inpatient Hospital Stay (HOSPITAL_COMMUNITY): Payer: Self-pay | Admitting: Anesthesiology

## 2019-12-02 HISTORY — PX: OPEN REDUCTION INTERNAL FIXATION (ORIF) DISTAL RADIAL FRACTURE: SHX5989

## 2019-12-02 HISTORY — PX: CLOSED REDUCTION FINGER WITH PERCUTANEOUS PINNING: SHX5612

## 2019-12-02 LAB — GLUCOSE, CAPILLARY
Glucose-Capillary: 129 mg/dL — ABNORMAL HIGH (ref 70–99)
Glucose-Capillary: 112 mg/dL — ABNORMAL HIGH (ref 70–99)
Glucose-Capillary: 121 mg/dL — ABNORMAL HIGH (ref 70–99)
Glucose-Capillary: 172 mg/dL — ABNORMAL HIGH (ref 70–99)

## 2019-12-02 SURGERY — OPEN REDUCTION INTERNAL FIXATION (ORIF) DISTAL RADIUS FRACTURE
Anesthesia: Regional | Site: Wrist | Laterality: Right

## 2019-12-02 MED ORDER — DEXAMETHASONE SODIUM PHOSPHATE 10 MG/ML IJ SOLN
INTRAMUSCULAR | Status: DC | PRN
  Administered 2019-12-02: 10 mg via INTRAVENOUS

## 2019-12-02 MED ORDER — 0.9 % SODIUM CHLORIDE (POUR BTL) OPTIME
TOPICAL | Status: DC | PRN
  Administered 2019-12-02: 1000 mL

## 2019-12-02 MED ORDER — MIDAZOLAM HCL 2 MG/2ML IJ SOLN
INTRAMUSCULAR | Status: AC
  Filled 2019-12-02: qty 2

## 2019-12-02 MED ORDER — MIDAZOLAM HCL 5 MG/5ML IJ SOLN
INTRAMUSCULAR | Status: DC | PRN
  Administered 2019-12-02: 2 mg via INTRAVENOUS

## 2019-12-02 MED ORDER — MIDAZOLAM HCL 2 MG/2ML IJ SOLN
INTRAMUSCULAR | Status: AC
  Administered 2019-12-02: 2 mg via INTRAVENOUS
  Filled 2019-12-02: qty 2

## 2019-12-02 MED ORDER — CEFAZOLIN SODIUM-DEXTROSE 2-4 GM/100ML-% IV SOLN
INTRAVENOUS | Status: AC
  Filled 2019-12-02: qty 100

## 2019-12-02 MED ORDER — ACETAMINOPHEN 325 MG PO TABS: 325.0000 mg | ORAL_TABLET | ORAL | Status: DC | PRN

## 2019-12-02 MED ORDER — PROPOFOL 10 MG/ML IV BOLUS
INTRAVENOUS | Status: AC
  Filled 2019-12-02: qty 20

## 2019-12-02 MED ORDER — EPHEDRINE SULFATE 50 MG/ML IJ SOLN
INTRAMUSCULAR | Status: DC | PRN
  Administered 2019-12-02: 80 mg via INTRAVENOUS
  Administered 2019-12-02: 10 mg via INTRAVENOUS

## 2019-12-02 MED ORDER — BUPIVACAINE HCL (PF) 0.25 % IJ SOLN
INTRAMUSCULAR | Status: AC
  Filled 2019-12-02: qty 10

## 2019-12-02 MED ORDER — HYDROMORPHONE HCL 1 MG/ML IJ SOLN
0.2500 mg | INTRAMUSCULAR | Status: DC | PRN
  Administered 2019-12-02: 0.5 mg via INTRAVENOUS

## 2019-12-02 MED ORDER — HYDROMORPHONE HCL 1 MG/ML IJ SOLN
INTRAMUSCULAR | Status: AC
  Administered 2019-12-02: 0.5 mg via INTRAVENOUS
  Filled 2019-12-02: qty 1

## 2019-12-02 MED ORDER — FENTANYL CITRATE (PF) 100 MCG/2ML IJ SOLN
INTRAMUSCULAR | Status: AC
  Administered 2019-12-02: 50 ug via INTRAVENOUS
  Filled 2019-12-02: qty 2

## 2019-12-02 MED ORDER — CEFAZOLIN SODIUM-DEXTROSE 1-4 GM/50ML-% IV SOLN
1.0000 g | Freq: Three times a day (TID) | INTRAVENOUS | Status: AC
  Administered 2019-12-03 (×3): 1 g via INTRAVENOUS
  Filled 2019-12-02 (×3): qty 50

## 2019-12-02 MED ORDER — ONDANSETRON HCL 4 MG/2ML IJ SOLN
INTRAMUSCULAR | Status: DC | PRN
  Administered 2019-12-02: 4 mg via INTRAVENOUS

## 2019-12-02 MED ORDER — OXYCODONE HCL 5 MG/5ML PO SOLN: 5.0000 mg | Freq: Once | ORAL | Status: DC | PRN

## 2019-12-02 MED ORDER — LIDOCAINE HCL (CARDIAC) PF 100 MG/5ML IV SOSY
PREFILLED_SYRINGE | INTRAVENOUS | Status: DC | PRN
  Administered 2019-12-02: 60 mg via INTRAVENOUS

## 2019-12-02 MED ORDER — ACETAMINOPHEN 160 MG/5ML PO SOLN: 325.0000 mg | ORAL | Status: DC | PRN

## 2019-12-02 MED ORDER — CEFAZOLIN SODIUM-DEXTROSE 1-4 GM/50ML-% IV SOLN
1.0000 g | INTRAVENOUS | Status: AC
  Administered 2019-12-02: 1 g via INTRAVENOUS
  Filled 2019-12-02: qty 50

## 2019-12-02 MED ORDER — MIDAZOLAM HCL 2 MG/2ML IJ SOLN: 2.0000 mg | Freq: Once | INTRAMUSCULAR | Status: AC

## 2019-12-02 MED ORDER — ROPIVACAINE HCL 5 MG/ML IJ SOLN
INTRAMUSCULAR | Status: DC | PRN
  Administered 2019-12-02: 25 mL via PERINEURAL

## 2019-12-02 MED ORDER — FENTANYL CITRATE (PF) 100 MCG/2ML IJ SOLN: 50.0000 ug | Freq: Once | INTRAMUSCULAR | Status: AC

## 2019-12-02 MED ORDER — PROPOFOL 10 MG/ML IV BOLUS
INTRAVENOUS | Status: DC | PRN
  Administered 2019-12-02: 130 mg via INTRAVENOUS

## 2019-12-02 MED ORDER — OXYCODONE HCL 5 MG PO TABS: 5.0000 mg | ORAL_TABLET | Freq: Once | ORAL | Status: DC | PRN

## 2019-12-02 MED ORDER — FENTANYL CITRATE (PF) 100 MCG/2ML IJ SOLN
INTRAMUSCULAR | Status: DC | PRN
Start: 2019-12-02 — End: 2019-12-02
  Administered 2019-12-02 (×5): 50 ug via INTRAVENOUS

## 2019-12-02 MED ORDER — HYDROMORPHONE HCL 1 MG/ML IJ SOLN
1.0000 mg | INTRAMUSCULAR | Status: DC | PRN
  Administered 2019-12-02: 1 mg via INTRAVENOUS
  Filled 2019-12-02: qty 1

## 2019-12-02 MED ORDER — FENTANYL CITRATE (PF) 250 MCG/5ML IJ SOLN
INTRAMUSCULAR | Status: AC
  Filled 2019-12-02: qty 5

## 2019-12-02 MED ORDER — ONDANSETRON HCL 4 MG/2ML IJ SOLN: 4.0000 mg | Freq: Once | INTRAMUSCULAR | Status: DC | PRN

## 2019-12-02 MED ORDER — CEFAZOLIN SODIUM-DEXTROSE 2-3 GM-%(50ML) IV SOLR
INTRAVENOUS | Status: DC | PRN
  Administered 2019-12-02: 2 g via INTRAVENOUS

## 2019-12-02 MED ORDER — LACTATED RINGERS IV SOLN: INTRAVENOUS | Status: DC | PRN

## 2019-12-02 SURGICAL SUPPLY — 74 items
BIT DRILL 2.0 LNG QUCK RELEASE (BIT) ×2 IMPLANT
BIT DRILL 2.8 QUICK RELEASE (BIT) ×2 IMPLANT
BLADE CLIPPER SURG (BLADE) IMPLANT
BNDG CMPR 9X4 STRL LF SNTH (GAUZE/BANDAGES/DRESSINGS) ×2
BNDG COHESIVE 1X5 TAN STRL LF (GAUZE/BANDAGES/DRESSINGS) ×3 IMPLANT
BNDG ELASTIC 3X5.8 VLCR STR LF (GAUZE/BANDAGES/DRESSINGS) ×3 IMPLANT
BNDG ELASTIC 4X5.8 VLCR STR LF (GAUZE/BANDAGES/DRESSINGS) ×3 IMPLANT
BNDG ESMARK 4X9 LF (GAUZE/BANDAGES/DRESSINGS) ×3 IMPLANT
BNDG GAUZE ELAST 4 BULKY (GAUZE/BANDAGES/DRESSINGS) ×3 IMPLANT
BONE CANC CHIPS 20CC PCAN1/4 (Bone Implant) ×3 IMPLANT
CHIPS CANC BONE 20CC PCAN1/4 (Bone Implant) ×2 IMPLANT
CORD BIPOLAR FORCEPS 12FT (ELECTRODE) ×3 IMPLANT
COVER SURGICAL LIGHT HANDLE (MISCELLANEOUS) ×3 IMPLANT
COVER WAND RF STERILE (DRAPES) ×3 IMPLANT
CUFF TOURN SGL QUICK 18X4 (TOURNIQUET CUFF) ×3 IMPLANT
CUFF TOURN SGL QUICK 24 (TOURNIQUET CUFF)
CUFF TRNQT CYL 24X4X16.5-23 (TOURNIQUET CUFF) IMPLANT
DECANTER SPIKE VIAL GLASS SM (MISCELLANEOUS) IMPLANT
DRAIN TLS ROUND 10FR (DRAIN) IMPLANT
DRAPE OEC MINIVIEW 54X84 (DRAPES) IMPLANT
DRAPE SURG 17X23 STRL (DRAPES) ×3 IMPLANT
DRILL 2.0 LNG QUICK RELEASE (BIT) ×3
DRILL 2.8 QUICK RELEASE (BIT) ×3
DRSG MEPILEX BORDER 4X4 (GAUZE/BANDAGES/DRESSINGS) ×3 IMPLANT
DRSG XEROFORM 1X8 (GAUZE/BANDAGES/DRESSINGS) ×3 IMPLANT
GAUZE SPONGE 4X4 12PLY STRL (GAUZE/BANDAGES/DRESSINGS) ×6 IMPLANT
GAUZE XEROFORM 1X8 LF (GAUZE/BANDAGES/DRESSINGS) ×3 IMPLANT
GLOVE BIO SURGEON STRL SZ7.5 (GLOVE) ×3 IMPLANT
GLOVE BIOGEL PI IND STRL 8 (GLOVE) ×2 IMPLANT
GLOVE BIOGEL PI INDICATOR 8 (GLOVE) ×1
GOWN STRL REUS W/ TWL LRG LVL3 (GOWN DISPOSABLE) ×6 IMPLANT
GOWN STRL REUS W/ TWL XL LVL3 (GOWN DISPOSABLE) ×6 IMPLANT
GOWN STRL REUS W/TWL LRG LVL3 (GOWN DISPOSABLE) ×9
GOWN STRL REUS W/TWL XL LVL3 (GOWN DISPOSABLE) ×9
GUIDEWIRE ORTHO 0.054X6 (WIRE) ×9 IMPLANT
K-WIRE DBL 4X.28 (WIRE) ×6
KIT BASIN OR (CUSTOM PROCEDURE TRAY) ×3 IMPLANT
KIT TURNOVER KIT B (KITS) ×3 IMPLANT
KWIRE DBL 4X.28 (WIRE) ×4 IMPLANT
LOOP VESSEL MAXI BLUE (MISCELLANEOUS) IMPLANT
MANIFOLD NEPTUNE II (INSTRUMENTS) IMPLANT
NEEDLE 22X1 1/2 (OR ONLY) (NEEDLE) IMPLANT
NS IRRIG 1000ML POUR BTL (IV SOLUTION) ×3 IMPLANT
PACK ORTHO EXTREMITY (CUSTOM PROCEDURE TRAY) ×3 IMPLANT
PAD ARMBOARD 7.5X6 YLW CONV (MISCELLANEOUS) ×6 IMPLANT
PAD CAST 4YDX4 CTTN HI CHSV (CAST SUPPLIES) ×2 IMPLANT
PADDING CAST COTTON 4X4 STRL (CAST SUPPLIES) ×3
PLATE LEFT DIST RADIUS NARROW (Plate) ×3 IMPLANT
SCREW BN FT 16X2.3XLCK HEX CRT (Screw) ×2 IMPLANT
SCREW CORT FT 18X2.3XLCK HEX (Screw) ×2 IMPLANT
SCREW CORT FT 20X2.3XLCK HEX (Screw) ×2 IMPLANT
SCREW CORT FT 22X2.3XLCK HEX (Screw) ×2 IMPLANT
SCREW CORTICAL LOCKING 2.3X16M (Screw) ×3 IMPLANT
SCREW CORTICAL LOCKING 2.3X18M (Screw) ×3 IMPLANT
SCREW CORTICAL LOCKING 2.3X20M (Screw) ×12 IMPLANT
SCREW CORTICAL LOCKING 2.3X22M (Screw) ×6 IMPLANT
SCREW FX20X2.3XSMTH LCK NS CRT (Screw) ×6 IMPLANT
SCREW FX22X2.3XLCK SMTH NS CRT (Screw) ×2 IMPLANT
SCREW HEXALOBE NON-LOCK 3.5X14 (Screw) ×3 IMPLANT
SCREW NONLOCK HEX 3.5X12 (Screw) ×6 IMPLANT
SPONGE LAP 4X18 RFD (DISPOSABLE) IMPLANT
SUT ETHILON 4 0 PS 2 18 (SUTURE) ×6 IMPLANT
SUT MNCRL AB 4-0 PS2 18 (SUTURE) ×3 IMPLANT
SUT PROLENE 3 0 PS 2 (SUTURE) IMPLANT
SUT VIC AB 3-0 FS2 27 (SUTURE) IMPLANT
SUT VIC AB 4-0 PS2 18 (SUTURE) ×3 IMPLANT
SYR CONTROL 10ML LL (SYRINGE) IMPLANT
SYSTEM CHEST DRAIN TLS 7FR (DRAIN) IMPLANT
TOWEL GREEN STERILE (TOWEL DISPOSABLE) ×3 IMPLANT
TOWEL GREEN STERILE FF (TOWEL DISPOSABLE) ×3 IMPLANT
TUBE CONNECTING 12X1/4 (SUCTIONS) ×3 IMPLANT
TUBE EVACUATION TLS (MISCELLANEOUS) ×3 IMPLANT
UNDERPAD 30X36 HEAVY ABSORB (UNDERPADS AND DIAPERS) ×3 IMPLANT
WATER STERILE IRR 1000ML POUR (IV SOLUTION) ×3 IMPLANT

## 2019-12-02 NOTE — Op Note (Signed)
I assisted Surgeon(s) and Role:    * Betha Loa, MD - Primary    * Cindee Salt, MD - Assisting on the Procedure(s): OPEN REDUCTION RADIUS WITH PERCUTANEOU PINNING CLOSED REDUCTION FINGER WITH PERCUTANEOUS PINNING SMALL FINGER on 12/02/2019.  I provided assistance on this case as follows:setup, pinning fracture left small finger, application of dressing and splint. Setup, approach, identification,  reduction and bone grafting of right distal radius fracture, stabilization, application of plate and screws radius fracture, closure of the wound and application of dressing. Electronically signed by: Cindee Salt, MD Date: 12/02/2019 Time: 6:24 PM
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
## 2019-12-02 NOTE — OR Nursing (Signed)
Pt is awake,alert and oriented.Pt  verbalized understanding of poc and admission via interpreter. Reviewed admission and on going care with receiving RN. Pt is in NAD at this time and is ready to be transferred to floor. Will con't to monitor until pt is transferred.  Pt on 02 2 liter with 3/4 tank in place and secured

## 2019-12-02 NOTE — Anesthesia Procedure Notes (Addendum)
Anesthesia Regional Block: Supraclavicular block   Pre-Anesthetic Checklist: ,, timeout performed, Correct Patient, Correct Site, Correct Laterality, Correct Procedure, Correct Position, site marked, Risks and benefits discussed,  Surgical consent,  Pre-op evaluation,  At surgeon's request and post-op pain management  Laterality: Right  Prep: Dura Prep       Needles:  Injection technique: Single-shot  Needle Type: Echogenic Stimulator Needle      Needle Gauge: 20     Additional Needles:   Procedures:,,,, ultrasound used (permanent image in chart),,,,  Narrative:  Start time: 12/02/2019 4:00 PM End time: 12/02/2019 4:05 PM Injection made incrementally with aspirations every 5 mL.  Performed by: Personally  Anesthesiologist: Atilano Median, DO  Additional Notes: Patient identified. Risks/Benefits/Options discussed with patient including but not limited to bleeding, infection, nerve damage, failed block, incomplete pain control. Patient expressed understanding and wished to proceed. All questions were answered. Sterile technique was used throughout the entire procedure. Please see nursing notes for vital signs. Aspirated in 5cc intervals with injection for negative confirmation. Patient was given instructions on fall risk and not to get out of bed. All questions and concerns addressed with instructions to call with any issues or inadequate analgesia.

## 2019-12-02 NOTE — Anesthesia Postprocedure Evaluation (Signed)
Anesthesia Post Note  Patient: Kristen Aguirre  Procedure(s) Performed: OPEN REDUCTION RADIUS WITH PERCUTANEOU PINNING (Right Wrist) CLOSED REDUCTION FINGER WITH PERCUTANEOUS PINNING SMALL FINGER (Left Finger)     Patient location during evaluation: PACU Anesthesia Type: Regional and General Level of consciousness: awake and alert Pain management: pain level controlled Vital Signs Assessment: post-procedure vital signs reviewed and stable Respiratory status: spontaneous breathing, nonlabored ventilation, respiratory function stable and patient connected to nasal cannula oxygen Cardiovascular status: blood pressure returned to baseline and stable Postop Assessment: no apparent nausea or vomiting Anesthetic complications: no   No complications documented.  Last Vitals:  Vitals:   12/02/19 1900 12/02/19 1915  BP: 121/68 113/68  Pulse:    Resp:    Temp:  37.1 C  SpO2: 92%     Last Pain:  Vitals:   12/02/19 1915  TempSrc:   PainSc: 5                  Crue Otero

## 2019-12-02 NOTE — Transfer of Care (Signed)
Immediate Anesthesia Transfer of Care Note  Patient: Kristen Aguirre  Procedure(s) Performed: OPEN REDUCTION RADIUS WITH PERCUTANEOU PINNING (Right Wrist) CLOSED REDUCTION FINGER WITH PERCUTANEOUS PINNING SMALL FINGER (Left Finger)  Patient Location: PACU  Anesthesia Type:General  Level of Consciousness: awake, alert  and patient cooperative  Airway & Oxygen Therapy: Patient Spontanous Breathing  Post-op Assessment: Report given to RN and Post -op Vital signs reviewed and stable  Post vital signs: Reviewed and stable  Last Vitals:  Vitals Value Taken Time  BP 106/54 12/02/19 1834  Temp    Pulse 117 12/02/19 1837  Resp 21 12/02/19 1837  SpO2 95 % 12/02/19 1837  Vitals shown include unvalidated device data.  Last Pain:  Vitals:   12/02/19 1400  TempSrc:   PainSc: Asleep      Patients Stated Pain Goal: 3 (11/30/19 1527)  Complications: No complications documented.

## 2019-12-02 NOTE — Progress Notes (Signed)
Late entry.  Patient seen ~3:00 PM  Patient seen in preop holding.  Video interpreter used.  Discussed fractures of right distal radius and left small finger.  Discussed plan for reduction and pinning of left small finger and open reduction and fixation of right distal radius with possible bone grafting. Discussed intraarticular nature of fracture and likelihood of post traumatic arthritis.  We also discussed possible need for bone graft and the low but possible risk of disease transmission.  She voiced understanding of these risks and elected to proceed. Risks, benefits and alternatives of surgery were discussed including risks of blood loss, infection, damage to nerves/vessels/tendons/ligament/bone, failure of surgery, need for additional surgery, complication with wound healing, stiffness, nonunion, malunion.  She voiced understanding of these risks and elected to proceed. Questions invited and answered.
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
## 2019-12-02 NOTE — Anesthesia Procedure Notes (Signed)
Procedure Name: LMA Insertion Date/Time: 12/02/2019 4:19 PM Performed by: Tillman Abide, CRNA Pre-anesthesia Checklist: Patient identified, Emergency Drugs available, Suction available and Patient being monitored Patient Re-evaluated:Patient Re-evaluated prior to induction Oxygen Delivery Method: Circle System Utilized Preoxygenation: Pre-oxygenation with 100% oxygen Induction Type: IV induction Ventilation: Mask ventilation without difficulty LMA: LMA inserted LMA Size: 4.0 Number of attempts: 1 Placement Confirmation: positive ETCO2 Tube secured with: Tape Dental Injury: Teeth and Oropharynx as per pre-operative assessment

## 2019-12-02 NOTE — Plan of Care (Signed)
  Problem: Respiratory: Goal: Will maintain a patent airway Outcome: Progressing   Problem: Education: Goal: Knowledge of General Education information will improve Description: Including pain rating scale, medication(s)/side effects and non-pharmacologic comfort measures Outcome: Progressing   Problem: Clinical Measurements: Goal: Ability to maintain clinical measurements within normal limits will improve Outcome: Progressing   Problem: Activity: Goal: Risk for activity intolerance will decrease Outcome: Progressing   Problem: Nutrition: Goal: Adequate nutrition will be maintained Outcome: Progressing   Problem: Coping: Goal: Level of anxiety will decrease Outcome: Progressing   Problem: Elimination: Goal: Will not experience complications related to bowel motility Outcome: Progressing   Problem: Pain Managment: Goal: General experience of comfort will improve Outcome: Progressing   Problem: Safety: Goal: Ability to remain free from injury will improve Outcome: Progressing

## 2019-12-03 ENCOUNTER — Encounter (HOSPITAL_COMMUNITY): Payer: Self-pay | Admitting: Orthopedic Surgery

## 2019-12-03 LAB — CBC
HCT: 32.9 % — ABNORMAL LOW (ref 36.0–46.0)
Hemoglobin: 10.5 g/dL — ABNORMAL LOW (ref 12.0–15.0)
MCH: 30.7 pg (ref 26.0–34.0)
MCHC: 31.9 g/dL (ref 30.0–36.0)
MCV: 96.2 fL (ref 80.0–100.0)
Platelets: 367 10*3/uL (ref 150–400)
RBC: 3.42 MIL/uL — ABNORMAL LOW (ref 3.87–5.11)
RDW: 14.3 % (ref 11.5–15.5)
WBC: 6.5 10*3/uL (ref 4.0–10.5)
nRBC: 0 % (ref 0.0–0.2)

## 2019-12-03 LAB — BASIC METABOLIC PANEL
Anion gap: 10 (ref 5–15)
BUN: 13 mg/dL (ref 6–20)
CO2: 25 mmol/L (ref 22–32)
Calcium: 8.8 mg/dL — ABNORMAL LOW (ref 8.9–10.3)
Chloride: 100 mmol/L (ref 98–111)
Creatinine, Ser: 0.56 mg/dL (ref 0.44–1.00)
GFR, Estimated: >60 mL/min (ref >60)
Glucose, Bld: 115 mg/dL — ABNORMAL HIGH (ref 70–99)
Potassium: 4 mmol/L (ref 3.5–5.1)
Sodium: 135 mmol/L (ref 135–145)

## 2019-12-03 LAB — GLUCOSE, CAPILLARY
Glucose-Capillary: 143 mg/dL — ABNORMAL HIGH (ref 70–99)
Glucose-Capillary: 163 mg/dL — ABNORMAL HIGH (ref 70–99)
Glucose-Capillary: 151 mg/dL — ABNORMAL HIGH (ref 70–99)
Glucose-Capillary: 151 mg/dL — ABNORMAL HIGH (ref 70–99)

## 2019-12-03 MED ORDER — SODIUM CHLORIDE 0.9 % IV SOLN: INTRAVENOUS | Status: DC

## 2019-12-03 MED ORDER — APIXABAN 2.5 MG PO TABS
2.5000 mg | ORAL_TABLET | Freq: Two times a day (BID) | ORAL | Status: DC
  Administered 2019-12-03 – 2019-12-04 (×3): 2.5 mg via ORAL
  Filled 2019-12-03 (×3): qty 1

## 2019-12-03 MED ORDER — HYDROMORPHONE HCL 1 MG/ML IJ SOLN: 0.5000 mg | INTRAMUSCULAR | Status: DC | PRN

## 2019-12-03 MED ORDER — SODIUM CHLORIDE 0.9 % IV BOLUS
500.0000 mL | Freq: Once | INTRAVENOUS | Status: AC
  Administered 2019-12-03: 500 mL via INTRAVENOUS
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
## 2019-12-03 NOTE — Progress Notes (Addendum)
Inpatient Rehabilitation Admissions Coordinator  Noted soft BP with fluid bolus today. Await postoperative therapy assessment. We will follow up tomorrow with possible admit pending tolerance with therapies.  Ottie Glazier, RN, MSN Rehab Admissions Coordinator [phone] 12/03/2019 12:21 PM

## 2019-12-03 NOTE — Plan of Care (Signed)
  Problem: Coping: Goal: Psychosocial and spiritual needs will be supported Outcome: Progressing   Problem: Respiratory: Goal: Will maintain a patent airway Outcome: Progressing   Problem: Clinical Measurements: Goal: Ability to maintain clinical measurements within normal limits will improve Outcome: Progressing   Problem: Activity: Goal: Risk for activity intolerance will decrease Outcome: Progressing   Problem: Nutrition: Goal: Adequate nutrition will be maintained Outcome: Progressing   Problem: Coping: Goal: Level of anxiety will decrease Outcome: Progressing   Problem: Elimination: Goal: Will not experience complications related to bowel motility Outcome: Progressing   Problem: Pain Managment: Goal: General experience of comfort will improve Outcome: Progressing   Problem: Safety: Goal: Ability to remain free from injury will improve Outcome: Progressing   Problem: Skin Integrity: Goal: Risk for impaired skin integrity will decrease Outcome: Progressing

## 2019-12-04 ENCOUNTER — Inpatient Hospital Stay (HOSPITAL_COMMUNITY)
Admission: RE | Admit: 2019-12-04 | Discharge: 2019-12-18 | DRG: 560 | Disposition: A | Discharge status: Home Health | Payer: Self-pay | Source: Intra-hospital | Attending: Physical Medicine & Rehabilitation | Admitting: Physical Medicine & Rehabilitation

## 2019-12-04 ENCOUNTER — Other Ambulatory Visit: Payer: Self-pay

## 2019-12-04 ENCOUNTER — Encounter (HOSPITAL_COMMUNITY): Payer: Self-pay | Admitting: Physical Medicine & Rehabilitation

## 2019-12-04 DIAGNOSIS — S32402D Unspecified fracture of left acetabulum, subsequent encounter for fracture with routine healing: Secondary | ICD-10-CM

## 2019-12-04 DIAGNOSIS — Z6835 Body mass index (BMI) 35.0-35.9, adult: Secondary | ICD-10-CM

## 2019-12-04 DIAGNOSIS — S73005D Unspecified dislocation of left hip, subsequent encounter: Secondary | ICD-10-CM

## 2019-12-04 DIAGNOSIS — I952 Hypotension due to drugs: Secondary | ICD-10-CM | POA: Diagnosis present

## 2019-12-04 DIAGNOSIS — E119 Type 2 diabetes mellitus without complications: Secondary | ICD-10-CM | POA: Diagnosis present

## 2019-12-04 DIAGNOSIS — U071 COVID-19: Secondary | ICD-10-CM | POA: Diagnosis not present

## 2019-12-04 DIAGNOSIS — S52501E Unspecified fracture of the lower end of right radius, subsequent encounter for open fracture type I or II with routine healing: Secondary | ICD-10-CM

## 2019-12-04 DIAGNOSIS — S22000A Wedge compression fracture of unspecified thoracic vertebra, initial encounter for closed fracture: Secondary | ICD-10-CM | POA: Diagnosis present

## 2019-12-04 DIAGNOSIS — Z8616 Personal history of COVID-19: Secondary | ICD-10-CM

## 2019-12-04 DIAGNOSIS — S52601D Unspecified fracture of lower end of right ulna, subsequent encounter for closed fracture with routine healing: Secondary | ICD-10-CM

## 2019-12-04 DIAGNOSIS — K5903 Drug induced constipation: Secondary | ICD-10-CM | POA: Diagnosis present

## 2019-12-04 DIAGNOSIS — S62627D Displaced fracture of medial phalanx of left little finger, subsequent encounter for fracture with routine healing: Secondary | ICD-10-CM

## 2019-12-04 DIAGNOSIS — D62 Acute posthemorrhagic anemia: Secondary | ICD-10-CM | POA: Diagnosis present

## 2019-12-04 DIAGNOSIS — F1010 Alcohol abuse, uncomplicated: Secondary | ICD-10-CM | POA: Diagnosis present

## 2019-12-04 DIAGNOSIS — S301XXD Contusion of abdominal wall, subsequent encounter: Secondary | ICD-10-CM

## 2019-12-04 DIAGNOSIS — E039 Hypothyroidism, unspecified: Secondary | ICD-10-CM | POA: Diagnosis present

## 2019-12-04 DIAGNOSIS — I959 Hypotension, unspecified: Secondary | ICD-10-CM | POA: Diagnosis not present

## 2019-12-04 DIAGNOSIS — M4854XD Collapsed vertebra, not elsewhere classified, thoracic region, subsequent encounter for fracture with routine healing: Principal | ICD-10-CM | POA: Diagnosis present

## 2019-12-04 DIAGNOSIS — S52501A Unspecified fracture of the lower end of right radius, initial encounter for closed fracture: Secondary | ICD-10-CM

## 2019-12-04 DIAGNOSIS — S32422A Displaced fracture of posterior wall of left acetabulum, initial encounter for closed fracture: Secondary | ICD-10-CM

## 2019-12-04 DIAGNOSIS — T07XXXA Unspecified multiple injuries, initial encounter: Secondary | ICD-10-CM | POA: Diagnosis present

## 2019-12-04 DIAGNOSIS — E669 Obesity, unspecified: Secondary | ICD-10-CM | POA: Diagnosis present

## 2019-12-04 DIAGNOSIS — G8918 Other acute postprocedural pain: Secondary | ICD-10-CM

## 2019-12-04 DIAGNOSIS — S060X9A Concussion with loss of consciousness of unspecified duration, initial encounter: Secondary | ICD-10-CM

## 2019-12-04 LAB — CBC
HCT: 33.4 % — ABNORMAL LOW (ref 36.0–46.0)
Hemoglobin: 10.5 g/dL — ABNORMAL LOW (ref 12.0–15.0)
MCH: 30.5 pg (ref 26.0–34.0)
MCHC: 31.4 g/dL (ref 30.0–36.0)
MCV: 97.1 fL (ref 80.0–100.0)
Platelets: 300 10*3/uL (ref 150–400)
RBC: 3.44 MIL/uL — ABNORMAL LOW (ref 3.87–5.11)
RDW: 14.6 % (ref 11.5–15.5)
WBC: 4.8 10*3/uL (ref 4.0–10.5)
nRBC: 0 % (ref 0.0–0.2)

## 2019-12-04 LAB — BASIC METABOLIC PANEL
Anion gap: 11 (ref 5–15)
BUN: 9 mg/dL (ref 6–20)
CO2: 23 mmol/L (ref 22–32)
Calcium: 8.3 mg/dL — ABNORMAL LOW (ref 8.9–10.3)
Chloride: 106 mmol/L (ref 98–111)
Creatinine, Ser: 0.52 mg/dL (ref 0.44–1.00)
GFR, Estimated: >60 mL/min (ref >60)
Glucose, Bld: 142 mg/dL — ABNORMAL HIGH (ref 70–99)
Potassium: 3.6 mmol/L (ref 3.5–5.1)
Sodium: 140 mmol/L (ref 135–145)

## 2019-12-04 LAB — GLUCOSE, CAPILLARY
Glucose-Capillary: 127 mg/dL — ABNORMAL HIGH (ref 70–99)
Glucose-Capillary: 170 mg/dL — ABNORMAL HIGH (ref 70–99)
Glucose-Capillary: 152 mg/dL — ABNORMAL HIGH (ref 70–99)
Glucose-Capillary: 133 mg/dL — ABNORMAL HIGH (ref 70–99)

## 2019-12-04 MED ORDER — INSULIN ASPART 100 UNIT/ML ~~LOC~~ SOLN
0.0000 [IU] | Freq: Three times a day (TID) | SUBCUTANEOUS | Status: DC
  Administered 2019-12-05: 2 [IU] via SUBCUTANEOUS
  Administered 2019-12-05 – 2019-12-06 (×2): 1 [IU] via SUBCUTANEOUS
  Administered 2019-12-06 (×2): 2 [IU] via SUBCUTANEOUS
  Administered 2019-12-07: 1 [IU] via SUBCUTANEOUS
  Administered 2019-12-07: 2 [IU] via SUBCUTANEOUS
  Administered 2019-12-07 – 2019-12-08 (×2): 1 [IU] via SUBCUTANEOUS
  Administered 2019-12-08: 2 [IU] via SUBCUTANEOUS
  Administered 2019-12-09 – 2019-12-10 (×6): 1 [IU] via SUBCUTANEOUS
  Administered 2019-12-11 (×2): 2 [IU] via SUBCUTANEOUS
  Administered 2019-12-11: 1 [IU] via SUBCUTANEOUS
  Administered 2019-12-12 (×2): 2 [IU] via SUBCUTANEOUS
  Administered 2019-12-13 – 2019-12-15 (×6): 1 [IU] via SUBCUTANEOUS

## 2019-12-04 MED ORDER — BISACODYL 10 MG RE SUPP: 10.0000 mg | Freq: Every day | RECTAL | Status: DC | PRN

## 2019-12-04 MED ORDER — ADULT MULTIVITAMIN W/MINERALS CH
1.0000 | ORAL_TABLET | Freq: Every day | ORAL | Status: DC
  Administered 2019-12-05 – 2019-12-18 (×14): 1 via ORAL
  Filled 2019-12-04 (×14): qty 1

## 2019-12-04 MED ORDER — BENZONATATE 100 MG PO CAPS: 100.0000 mg | ORAL_CAPSULE | Freq: Three times a day (TID) | ORAL | Status: DC | PRN

## 2019-12-04 MED ORDER — GLUCERNA SHAKE PO LIQD
237.0000 mL | Freq: Two times a day (BID) | ORAL | Status: DC
  Administered 2019-12-05 – 2019-12-18 (×22): 237 mL via ORAL

## 2019-12-04 MED ORDER — FOLIC ACID 1 MG PO TABS
1.0000 mg | ORAL_TABLET | Freq: Every day | ORAL | Status: DC
  Administered 2019-12-05 – 2019-12-18 (×14): 1 mg via ORAL
  Filled 2019-12-04 (×14): qty 1

## 2019-12-04 MED ORDER — ACETAMINOPHEN 325 MG PO TABS
650.0000 mg | ORAL_TABLET | Freq: Four times a day (QID) | ORAL | Status: DC
  Administered 2019-12-04 – 2019-12-18 (×55): 650 mg via ORAL
  Filled 2019-12-04 (×56): qty 2

## 2019-12-04 MED ORDER — THIAMINE HCL 100 MG PO TABS
100.0000 mg | ORAL_TABLET | Freq: Every day | ORAL | Status: DC
  Administered 2019-12-05 – 2019-12-18 (×14): 100 mg via ORAL
  Filled 2019-12-04 (×14): qty 1

## 2019-12-04 MED ORDER — THIAMINE HCL 100 MG/ML IJ SOLN
100.0000 mg | Freq: Every day | INTRAMUSCULAR | Status: DC
  Filled 2019-12-04 (×6): qty 2

## 2019-12-04 MED ORDER — VITAMIN D 25 MCG (1000 UNIT) PO TABS
2000.0000 [IU] | ORAL_TABLET | Freq: Two times a day (BID) | ORAL | Status: DC
  Administered 2019-12-04 – 2019-12-18 (×28): 2000 [IU] via ORAL
  Filled 2019-12-04 (×27): qty 2

## 2019-12-04 MED ORDER — POLYETHYLENE GLYCOL 3350 17 G PO PACK
17.0000 g | PACK | Freq: Two times a day (BID) | ORAL | Status: DC
  Administered 2019-12-04 – 2019-12-17 (×20): 17 g via ORAL
  Filled 2019-12-04 (×28): qty 1

## 2019-12-04 MED ORDER — FLUTICASONE PROPIONATE 50 MCG/ACT NA SUSP
1.0000 | Freq: Every day | NASAL | Status: DC
  Administered 2019-12-05 – 2019-12-17 (×13): 1 via NASAL
  Filled 2019-12-04: qty 16

## 2019-12-04 MED ORDER — ALBUTEROL SULFATE HFA 108 (90 BASE) MCG/ACT IN AERS
2.0000 | INHALATION_SPRAY | Freq: Once | RESPIRATORY_TRACT | Status: DC | PRN
  Filled 2019-12-04: qty 6.7

## 2019-12-04 MED ORDER — GABAPENTIN 300 MG PO CAPS
300.0000 mg | ORAL_CAPSULE | Freq: Three times a day (TID) | ORAL | Status: DC
  Administered 2019-12-04 – 2019-12-11 (×20): 300 mg via ORAL
  Filled 2019-12-04 (×20): qty 1

## 2019-12-04 MED ORDER — ONDANSETRON 4 MG PO TBDP
4.0000 mg | ORAL_TABLET | Freq: Four times a day (QID) | ORAL | Status: DC | PRN
  Administered 2019-12-15: 4 mg via ORAL
  Filled 2019-12-04: qty 1

## 2019-12-04 MED ORDER — APIXABAN 2.5 MG PO TABS
2.5000 mg | ORAL_TABLET | Freq: Two times a day (BID) | ORAL | Status: DC
  Administered 2019-12-04 – 2019-12-18 (×28): 2.5 mg via ORAL
  Filled 2019-12-04 (×28): qty 1

## 2019-12-04 MED ORDER — ONDANSETRON HCL 4 MG/2ML IJ SOLN: 4.0000 mg | Freq: Four times a day (QID) | INTRAMUSCULAR | Status: DC | PRN

## 2019-12-04 MED ORDER — ASCORBIC ACID 500 MG PO TABS
1000.0000 mg | ORAL_TABLET | Freq: Every day | ORAL | Status: DC
  Administered 2019-12-05 – 2019-12-18 (×14): 1000 mg via ORAL
  Filled 2019-12-04 (×14): qty 2

## 2019-12-04 MED ORDER — OXYCODONE HCL 5 MG PO TABS
5.0000 mg | ORAL_TABLET | ORAL | Status: DC | PRN
  Administered 2019-12-05 – 2019-12-06 (×4): 10 mg via ORAL
  Administered 2019-12-06: 5 mg via ORAL
  Administered 2019-12-06: 10 mg via ORAL
  Administered 2019-12-07 (×3): 5 mg via ORAL
  Administered 2019-12-08 (×2): 10 mg via ORAL
  Administered 2019-12-08: 5 mg via ORAL
  Administered 2019-12-08 – 2019-12-09 (×3): 10 mg via ORAL
  Administered 2019-12-11 – 2019-12-12 (×2): 5 mg via ORAL
  Administered 2019-12-14 – 2019-12-15 (×2): 10 mg via ORAL
  Administered 2019-12-16 – 2019-12-17 (×4): 5 mg via ORAL
  Administered 2019-12-18: 10 mg via ORAL
  Filled 2019-12-04: qty 2
  Filled 2019-12-04 (×4): qty 1
  Filled 2019-12-04: qty 2
  Filled 2019-12-04 (×4): qty 1
  Filled 2019-12-04 (×6): qty 2
  Filled 2019-12-04: qty 1
  Filled 2019-12-04 (×2): qty 2
  Filled 2019-12-04: qty 1
  Filled 2019-12-04 (×3): qty 2
  Filled 2019-12-04: qty 1
  Filled 2019-12-04: qty 2

## 2019-12-04 MED ORDER — DOCUSATE SODIUM 100 MG PO CAPS
100.0000 mg | ORAL_CAPSULE | Freq: Two times a day (BID) | ORAL | Status: DC
  Administered 2019-12-04 – 2019-12-18 (×28): 100 mg via ORAL
  Filled 2019-12-04 (×28): qty 1

## 2019-12-04 MED ORDER — METHOCARBAMOL 750 MG PO TABS
750.0000 mg | ORAL_TABLET | Freq: Four times a day (QID) | ORAL | Status: DC
  Administered 2019-12-04 – 2019-12-18 (×55): 750 mg via ORAL
  Filled 2019-12-04 (×55): qty 1
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
## 2019-12-04 NOTE — Progress Notes (Signed)
Pt's bed ready on CIR, pt will transfer/admit to CIR room 4MW04.  Report given to [place], RN on .  Will transfer pt via her bed with her belongings to room 4MW04.
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
## 2019-12-04 NOTE — Progress Notes (Signed)
Subjective: 2 Days Post-Op Procedure(s) (LRB): OPEN REDUCTION RADIUS WITH PERCUTANEOU PINNING (Right) CLOSED REDUCTION FINGER WITH PERCUTANEOUS PINNING SMALL FINGER (Left) Patient reports pain in right arm with motion of digits.  Interpreter used for visit.  Objective: Vital signs in last 24 hours: Temp:  [98 F (36.7 C)-98.9 F (37.2 C)] 98.3 F (36.8 C) (10/14 0900) Pulse Rate:  [61-78] 68 (10/14 0300) Resp:  [18-19] 18 (10/14 0300) BP: (90-116)/(49-64) 103/60 (10/14 0900) SpO2:  [94 %-96 %] 96 % (10/14 0300)  Intake/Output from previous day: 10/13 0701 - 10/14 0700 In: 2979.8 [P.O.:1320; I.V.:1609.8; IV Piggyback:50] Out: 1750 [Urine:1750] Intake/Output this shift: Total I/O In: 360 [P.O.:360] Out: -   Recent Labs    12/03/19 0907 12/04/19 0125  HGB 10.5* 10.5*   Recent Labs    12/03/19 0907 12/04/19 0125  WBC 6.5 4.8  RBC 3.42* 3.44*  HCT 32.9* 33.4*  PLT 367 300   Recent Labs    12/03/19 0907 12/04/19 0125  NA 135 140  K 4.0 3.6  CL 100 106  CO2 25 23  BUN 13 9  CREATININE 0.56 0.52  GLUCOSE 115* 142*  CALCIUM 8.8* 8.3*   No results for input(s): LABPT, INR in the last 72 hours.  Brisk capillary refill in digits.  Dressing right arm changed.  Compartments soft.  No erythema.   Assessment/Plan: 2 Days Post-Op Procedure(s) (LRB): OPEN REDUCTION RADIUS WITH PERCUTANEOU PINNING (Right) CLOSED REDUCTION FINGER WITH PERCUTANEOUS PINNING SMALL FINGER (Left) Post op pain.  Okay for digital ROM.  NWB right hand/wrist.  May use platform extension for walker.  Left hand no gripping with small finger but may use left hand with walker.  Pin care for ex fix.   Betha Loa 12/04/2019, 12:37 PM
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
## 2019-12-05 ENCOUNTER — Inpatient Hospital Stay (HOSPITAL_COMMUNITY): Payer: Self-pay

## 2019-12-05 ENCOUNTER — Inpatient Hospital Stay (HOSPITAL_COMMUNITY): Payer: Self-pay | Admitting: Occupational Therapy

## 2019-12-05 DIAGNOSIS — E119 Type 2 diabetes mellitus without complications: Secondary | ICD-10-CM

## 2019-12-05 DIAGNOSIS — T07XXXA Unspecified multiple injuries, initial encounter: Secondary | ICD-10-CM

## 2019-12-05 DIAGNOSIS — D62 Acute posthemorrhagic anemia: Secondary | ICD-10-CM

## 2019-12-05 DIAGNOSIS — K5903 Drug induced constipation: Secondary | ICD-10-CM

## 2019-12-05 DIAGNOSIS — G8918 Other acute postprocedural pain: Secondary | ICD-10-CM

## 2019-12-05 LAB — CBC WITH DIFFERENTIAL/PLATELET
Abs Immature Granulocytes: 0.02 10*3/uL (ref 0.00–0.07)
Basophils Absolute: 0 10*3/uL (ref 0.0–0.1)
Basophils Relative: 1 %
Eosinophils Absolute: 0.1 10*3/uL (ref 0.0–0.5)
Eosinophils Relative: 3 %
HCT: 35.8 % — ABNORMAL LOW (ref 36.0–46.0)
Hemoglobin: 11.6 g/dL — ABNORMAL LOW (ref 12.0–15.0)
Immature Granulocytes: 1 %
Lymphocytes Relative: 27 %
Lymphs Abs: 1.2 10*3/uL (ref 0.7–4.0)
MCH: 31.3 pg (ref 26.0–34.0)
MCHC: 32.4 g/dL (ref 30.0–36.0)
MCV: 96.5 fL (ref 80.0–100.0)
Monocytes Absolute: 0.4 10*3/uL (ref 0.1–1.0)
Monocytes Relative: 8 %
Neutro Abs: 2.6 10*3/uL (ref 1.7–7.7)
Neutrophils Relative %: 60 %
Platelets: 330 10*3/uL (ref 150–400)
RBC: 3.71 MIL/uL — ABNORMAL LOW (ref 3.87–5.11)
RDW: 14.6 % (ref 11.5–15.5)
WBC: 4.3 10*3/uL (ref 4.0–10.5)
nRBC: 0 % (ref 0.0–0.2)

## 2019-12-05 LAB — COMPREHENSIVE METABOLIC PANEL
ALT: 33 U/L (ref 0–44)
AST: 34 U/L (ref 15–41)
Albumin: 2.7 g/dL — ABNORMAL LOW (ref 3.5–5.0)
Alkaline Phosphatase: 108 U/L (ref 38–126)
Anion gap: 8 (ref 5–15)
BUN: <5 mg/dL — ABNORMAL LOW (ref 6–20)
CO2: 28 mmol/L (ref 22–32)
Calcium: 8.9 mg/dL (ref 8.9–10.3)
Chloride: 104 mmol/L (ref 98–111)
Creatinine, Ser: 0.53 mg/dL (ref 0.44–1.00)
GFR, Estimated: >60 mL/min (ref >60)
Glucose, Bld: 137 mg/dL — ABNORMAL HIGH (ref 70–99)
Potassium: 4 mmol/L (ref 3.5–5.1)
Sodium: 140 mmol/L (ref 135–145)
Total Bilirubin: 0.7 mg/dL (ref 0.3–1.2)
Total Protein: 7 g/dL (ref 6.5–8.1)

## 2019-12-05 LAB — GLUCOSE, CAPILLARY
Glucose-Capillary: 141 mg/dL — ABNORMAL HIGH (ref 70–99)
Glucose-Capillary: 116 mg/dL — ABNORMAL HIGH (ref 70–99)
Glucose-Capillary: 161 mg/dL — ABNORMAL HIGH (ref 70–99)
Glucose-Capillary: 175 mg/dL — ABNORMAL HIGH (ref 70–99)
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
## 2019-12-05 NOTE — Progress Notes (Signed)
Inpatient Rehabilitation  Patient information reviewed and entered into eRehab system by Cira Deyoe M. Shironda Kain, M.A., CCC/SLP, PPS Coordinator.  Information including medical coding, functional ability and quality indicators will be reviewed and updated through discharge.    
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
## 2019-12-05 NOTE — Progress Notes (Signed)
Inpatient Rehabilitation Center Individual Statement of Services  Patient Name:  Kristen Aguirre  Date:  12/05/2019  Welcome to the Inpatient Rehabilitation Center.  Our goal is to provide you with an individualized program based on your diagnosis and situation, designed to meet your specific needs.  With this comprehensive rehabilitation program, you will be expected to participate in at least 3 hours of rehabilitation therapies Monday-Friday, with modified therapy programming on the weekends.  Your rehabilitation program will include the following services:  Physical Therapy (PT), Occupational Therapy (OT), 24 hour per day rehabilitation nursing, Therapeutic Recreaction (TR), Neuropsychology, Care Coordinator, Rehabilitation Medicine, Nutrition Services and Pharmacy Services  Weekly team conferences will be held on Wednesday to discuss your progress.  Your Inpatient Rehabilitation Care Coordinator will talk with you frequently to get your input and to update you on team discussions.  Team conferences with you and your family in attendance may also be held.  Expected length of stay: 12-14 days  Overall anticipated outcome: min-mod-ADL's and CGA-mobility  Depending on your progress and recovery, your program may change. Your Inpatient Rehabilitation Care Coordinator will coordinate services and will keep you informed of any changes. Your Inpatient Rehabilitation Care Coordinator's name and contact numbers are listed  below.  The following services may also be recommended but are not provided by the Inpatient Rehabilitation Center:   Driving Evaluations  Home Health Rehabiltiation Services  Outpatient Rehabilitation Services   Arrangements will be made to provide these services after discharge if needed.  Arrangements include referral to agencies that provide these services.  Your insurance has been verified to be:  Uninsured-med-pay Your primary doctor is:  [facility]  Pertinent information will be shared with your doctor and your insurance company.  Inpatient Rehabilitation Care Coordinator:  Dossie Der, [place] [phone] or Luna Glasgow  Information discussed with and copy given to patient by: Lucy Chris, 12/05/2019, 11:37 AM

## 2019-12-06 ENCOUNTER — Inpatient Hospital Stay (HOSPITAL_COMMUNITY): Payer: Self-pay | Admitting: Physical Therapy

## 2019-12-06 ENCOUNTER — Inpatient Hospital Stay (HOSPITAL_COMMUNITY): Payer: Self-pay | Admitting: Occupational Therapy

## 2019-12-06 LAB — GLUCOSE, CAPILLARY
Glucose-Capillary: 135 mg/dL — ABNORMAL HIGH (ref 70–99)
Glucose-Capillary: 163 mg/dL — ABNORMAL HIGH (ref 70–99)
Glucose-Capillary: 173 mg/dL — ABNORMAL HIGH (ref 70–99)
Glucose-Capillary: 158 mg/dL — ABNORMAL HIGH (ref 70–99)

## 2019-12-06 NOTE — Progress Notes (Signed)
Occupational Therapy Session Note  Patient Details  Name: Kristen Aguirre MRN: [id number] Date of Birth: 11/22/1965  Today's Date: 12/06/2019 OT Individual Time: 1000-1100 OT Individual Time Calculation (min): 60 min    Short Term Goals: Week 1:  OT Short Term Goal 1 (Week 1): Pt will be able to touch thenar/hypthenar eminence with right index through small finger to encourage functional grasp. OT Short Term Goal 2 (Week 1): Pt will be able to bathe UB sitting sinkside with min assist using long handled sponge. OT Short Term Goal 3 (Week 1): Pt will be able to donn/doff loose fitting shirt with min assist. OT Short Term Goal 4 (Week 1): Pt will be able to complete toilet transfer with mod assist.  Skilled Therapeutic Interventions/Progress Updates:    Pt received in bed with interpretor present. Pt agreeable to working on a bath.  To review precautions, has pt list out what she could or could not do with each affected body part, she had good recall of her precautions.  Pt sat to EOB with mod A and tolerated sitting well. Covered L hand with plastic bag to allow her to use wet washcloths without getting her splint wet to engage in bathing.  Mod-max A overall.  Donned button up dress, then abdominal binder, then TLSO with total A.  Pt worked on scoot pivot transfers to her R to the recliner using her R leg and her L arm around this therapist while her torso was being supported. Scooted with mod A.  It took quite some time to adjust her in the recliner to support her back and arms.    Pt tolerated session well. Resting in recliner with all needs met. Call light in reach.   Therapy Documentation Precautions:  Precautions Precautions: Fall, Posterior Hip, Back Precaution Comments: abominal binder and TED Hose for BP mgt, ex fix R UE, full time right forearm splint and left hand/digit clam shell splint; no heavy gripping bilateral hands; TLSO when OOB Required Braces or Orthoses:  Spinal Brace, Splint/Cast Spinal Brace: Thoracolumbosacral orthotic, Applied in sitting position Spinal Brace Comments: when OOB Splint/Cast: modified [place] splint ; and left hand based IP gutter clam shell Restrictions Weight Bearing Restrictions: Yes RUE Weight Bearing: Non weight bearing LUE Weight Bearing: Non weight bearing RLE Weight Bearing: Weight bearing as tolerated LLE Weight Bearing: Touchdown weight bearing Other Position/Activity Restrictions: may use L hand when splint is on with no heavy gripping.   Pain: 5/10 diffuse pain in multiple areas - pt premedicated   ADL: ADL Eating: Set up Where Assessed-Eating: Bed level Grooming: Setup Where Assessed-Grooming: Chair Upper Body Bathing: Maximal assistance Where Assessed-Upper Body Bathing: Chair   Therapy/Group: Individual Therapy  SAGUIER,JULIA 12/06/2019, 1:22 PM  
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
## 2019-12-06 NOTE — Progress Notes (Signed)
Physical Therapy Session Note  Patient Details  Name: Kristen Aguirre MRN: [id number] Date of Birth: November 26, 1965  Today's Date: 12/06/2019 PT Individual Time: 0810-0904 and 4098-1191 PT Individual Time Calculation (min): 54 min  And 26 min and  Today's Date: 12/06/2019 PT Missed Time: 34 Minutes Missed Time Reason: Pain  Short Term Goals: Week 1:  PT Short Term Goal 1 (Week 1): Pt will perform bed mobility with minA PT Short Term Goal 2 (Week 1): Pt will perform bed<>chair transfers with minA and LRAD while maintaining WB restrictions PT Short Term Goal 3 (Week 1): Pt will ambulate at least 83ft with minA and LRAD PT Short Term Goal 4 (Week 1): Pt will propel herself at least 67ft wtih minA in manual w/c  Skilled Therapeutic Interventions/Progress Updates:    Session 1: Pt received supine in bed and agreeable to therapy session. In-person interpreter, [name], present throughout session. Pt reports 5/10 pain in L hip, R arm, and L finger - RN present for medication administration - therapist providing rest breaks, repositioning, and emotional support for pain management. RN and therapist discuss with pt that the RN will perform daily pin cleaning on her L finger surgical site. Pt able to recall all of her precautions, as described below, with only min cuing and therapist performing teach-back technique to ensure pt understanding - she will continue to require cuing for application of these precautions into her mobility. Pt reports incontinence of bladder in brief due to urgency that occurred prior to therapist arrival. Rolling R with mod assist and max cuing with visual demonstration on how to perform logroll technique - utilized pillow between knees for comfort and to assist with maintaining hip precautions. Therapist performed total assist LB clothing management and peri-care. Semi-sidelying to L with mod assist to complete LB clothing management on R side. Pt declines progression to OOB  mobility due to reports of feeling that she may need to have a BM soon.  Performed the following supine LE exercises:  - glute sets x10 reps 5 second hold - L LE heel slides x 10 reps - L LE hip abduction/adduction to neutral x10 reps - cuing to avoid hip internal rotation during this exercise - L LE ankle PF/DF x20 reps with focus on maintaining full ROM  Educated pt on performing these exercises daily outside of therapy session. Pt left supine in bed with needs in reach, pillows positioned for B UE support, and bed alarm on.  Session 2: Pt received sitting in the recliner tearful and reporting significant pain Paula Compton, RN present reporting medication administered ~34minutes prior in preparation for therapy. In-person interpreter, [name], present throughout session. Pt requesting for assistance back to bed and to rest due to increased pain. L lateral scoot transfer drop arm recliner>EOB with heavy mod assist for scooting hips using bed pad with pt able to maintain WBing precautions well despite possible increased use of L hand. Pt tearful during transfer and reports significant pain. Sit>supine with +2 total assist for trunk descent and B LE management into the bed to maintain precautions and due to significant pain. Once supine in bed pt reports most prominent pain is in bilateral hips. +2 total assist scoot towards HOB using bed pads. Therapeutically positioned pt with pillows to support UEs and while resting in supine pt reports pain is decreasing. Pt left supine in bed with needs in reach and bed alarm on. Missed 34 minutes of skilled physical therapy due to pain.  Therapy Documentation Precautions:  Precautions Precautions: Fall, Posterior Hip, Back Precaution Comments: abominal binder and TED Hose for BP mgt, ex fix R UE, full time right forearm splint and left hand/digit clam shell splint; no heavy gripping bilateral hands; TLSO when OOB Required Braces or Orthoses: Spinal Brace,  Splint/Cast Spinal Brace: Thoracolumbosacral orthotic, Applied in sitting position Spinal Brace Comments: when OOB Splint/Cast: modified [place]un[place] splint ; and left hand based IP gutter clam shell Restrictions Weight Bearing Restrictions: Yes RUE Weight Bearing: Non weight bearing LUE Weight Bearing: Non weight bearing RLE Weight Bearing: Weight bearing as tolerated LLE Weight Bearing: Touchdown weight bearing Other Position/Activity Restrictions: may use L hand when splint is on with no heavy gripping.  Therapy/Group: Individual Therapy  Ginny Forth , PT, DPT, CSRS  12/06/2019, 7:55 AM

## 2019-12-07 LAB — GLUCOSE, CAPILLARY
Glucose-Capillary: 171 mg/dL — ABNORMAL HIGH (ref 70–99)
Glucose-Capillary: 133 mg/dL — ABNORMAL HIGH (ref 70–99)
Glucose-Capillary: 142 mg/dL — ABNORMAL HIGH (ref 70–99)
Glucose-Capillary: 201 mg/dL — ABNORMAL HIGH (ref 70–99)

## 2019-12-07 NOTE — Progress Notes (Signed)
Pt noted with fairly good day today. Prn oxycodone given x2 for reports of pain to right arm with positive effects noted. Pain also managed on scheduled pain medications. Pin care performed to left 5th digit, no drainage noted.
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
## 2019-12-07 NOTE — Plan of Care (Signed)
  Problem: Consults Goal: RH GENERAL PATIENT EDUCATION Description: See Patient Education module for education specifics. Outcome: Progressing   Problem: RH SKIN INTEGRITY Goal: RH STG SKIN FREE OF INFECTION/BREAKDOWN Description: Patient will remain free of skin breakdown during length of stay Outcome: Progressing   Problem: RH SAFETY Goal: RH STG ADHERE TO SAFETY PRECAUTIONS W/ASSISTANCE/DEVICE Description: STG Adhere to Safety Precautions With Assistance/Device during length of stay. Outcome: Progressing

## 2019-12-08 ENCOUNTER — Inpatient Hospital Stay (HOSPITAL_COMMUNITY): Payer: Self-pay | Admitting: Occupational Therapy

## 2019-12-08 ENCOUNTER — Inpatient Hospital Stay (HOSPITAL_COMMUNITY): Payer: Self-pay

## 2019-12-08 DIAGNOSIS — T796XXD Traumatic ischemia of muscle, subsequent encounter: Secondary | ICD-10-CM

## 2019-12-08 LAB — GLUCOSE, CAPILLARY
Glucose-Capillary: 139 mg/dL — ABNORMAL HIGH (ref 70–99)
Glucose-Capillary: 113 mg/dL — ABNORMAL HIGH (ref 70–99)
Glucose-Capillary: 197 mg/dL — ABNORMAL HIGH (ref 70–99)
Glucose-Capillary: 142 mg/dL — ABNORMAL HIGH (ref 70–99)

## 2019-12-08 NOTE — Progress Notes (Signed)
Physical Therapy Session Note  Patient Details  Name: Jonika Critz MRN: [id number] Date of Birth: 1965-06-24  Today's Date: 12/08/2019 PT Individual Time: 0800-0900 + 1000-1100 PT Individual Time Calculation (min): 60 min  + 60 min  Short Term Goals: Week 1:  PT Short Term Goal 1 (Week 1): Pt will perform bed mobility with minA PT Short Term Goal 2 (Week 1): Pt will perform bed<>chair transfers with minA and LRAD while maintaining WB restrictions PT Short Term Goal 3 (Week 1): Pt will ambulate at least 75ft with minA and LRAD PT Short Term Goal 4 (Week 1): Pt will propel herself at least 78ft wtih minA in manual w/c  Skilled Therapeutic Interventions/Progress Updates:     1st session: Pt received supine in bed, agreeable to PT session, c/o 6/10 L hip pain. In-person interpreter, [name], present during session. RN notified and pain medication delivered after session. Provided rest breaks, emotional support, and repositioning throughout session for pain management. Pt with soiled brief on arrival. Performed rolling towards R with minA using bed rail and required totalA for removing soiled brief. Provided clean soaped washcloth where she used LUE for pericare with setupA. New brief donned with totalA for time management. She performed multiple bouts of rolling L<>R with minA each trial while using the bed rail for donning abdominal binder and brief. Supine<>sit with modA via log rolling technique, towards R EOB, requiring increased assist for trunk control. Able to sit EOB with supervision and donned quick-draw TLSO brace with totalA for time management. Performed 1x5 sit<>stands from lowered EOB height with no AD with min/modA, placing her L foot on therapists foot to monitor WB compliance. For reach stand, she was able to maintain for ~35 seconds prior to fatigue. Stand<>pivot with min/modA, towards her R side and no AD, from EOB to recliner. She required maxA for posterior seated  scooting and repositioning in the recliner, used chuck pad to assist with hip facilitation. Pillows placed for support and comfort. She remained reclined in chair with needs in reach at end of session.  2nd session: Pt received seated in recliner, agreeable to PT session. In-person interrupter, Sonia, assisted with interpretation during session. Therapist provided w/c cushion and LLE elevating leg rest. Pt shows therapist a photo of w/c that her family had purchased and it appears to be a transport chair. She also reports that this can fit through all doorways except for the bathroom doorway at her brother's house. Stand<>pivot from recliner to w/c, towards her R side, with min/modA with cues for WB compliance, weight shift, and technique. She required maxA for repositioning in w/c in order to shift her hips posteriorly, difficulty 2/2 height (pt is 75ft tall). WC transport with totalA from her room to ortho gym for time management. Stand<>pivot with min/modA from w/c to mat table with similar technique described above. Able to sit at edge of mat with supervision and used 4inch platform to prop BLE's due to her height. While seated, she performed the following there-ex: -1x10 LAQ with 2# ankle weight BLE -1x10 LAQ with 5 second isometric hold in knee extension with 2# ankle weight BLE -1x10 hip marches (to tolerance and with compliance of posterior hip precautions) with 2# ankle weight  She then performed lateral scooting, towards her R side, with minA along edge of mat. Instructions provided for head/hip ratio, sequencing, and using R foot to provide force to facilitate slide. Stand<>pivot with min/modA back to her w/c and WC transport return to her room  with totalA for time management. Stand<>pivot with min/modA from w/c to EOB, and doffed TLSO and abdominal binder with totalA while seated EOB. She then required modA for sit>supine for trunk control and BLE management via reverse log roll. Pt made  comfortable with pillow supporting BUE/BLE, HOB semi-elevated. She ended session with 3/4 bed rails up and bed alarm on.   Therapy Documentation Precautions:  Precautions Precautions: Fall, Posterior Hip, Back Precaution Comments: abominal binder and TED Hose for BP mgt, ex fix R UE, full time right forearm splint and left hand/digit clam shell splint; no heavy gripping bilateral hands; TLSO when OOB Required Braces or Orthoses: Spinal Brace, Splint/Cast Spinal Brace: Thoracolumbosacral orthotic, Applied in sitting position Spinal Brace Comments: when OOB Splint/Cast: modified Meunster splint ; and left hand based IP gutter clam shell Restrictions Weight Bearing Restrictions: Yes RUE Weight Bearing: Non weight bearing LUE Weight Bearing: Non weight bearing RLE Weight Bearing: Weight bearing as tolerated LLE Weight Bearing: Touchdown weight bearing Other Position/Activity Restrictions: may use L hand when splint is on with no heavy gripping. General:   Vital Signs: Therapy Vitals Temp: 97.9 F (36.6 C) Temp Source: Oral Pulse Rate: 67 Resp: 18 BP: (!) 110/57 Patient Position (if appropriate): Lying Oxygen Therapy SpO2: 98 % Pain: Pain Assessment Pain Score: 6  Mobility:   Locomotion :    Trunk/Postural Assessment :    Balance:   Exercises:   Other Treatments:      Therapy/Group: Individual Therapy  Orrin Brigham 12/08/2019, 7:56 AM

## 2019-12-08 NOTE — Progress Notes (Signed)
Occupational Therapy Session Note  Patient Details  Name: Rhapsody Wolven MRN: [id number] Date of Birth: 11-27-1965  Today's Date: 12/08/2019 OT Individual Time: 1300-1414 OT Individual Time Calculation (min): 74 min   Short Term Goals: Week 1:  OT Short Term Goal 1 (Week 1): Pt will be able to touch thenar/hypthenar eminence with right index through small finger to encourage functional grasp. OT Short Term Goal 2 (Week 1): Pt will be able to bathe UB sitting sinkside with min assist using long handled sponge. OT Short Term Goal 3 (Week 1): Pt will be able to donn/doff loose fitting shirt with min assist. OT Short Term Goal 4 (Week 1): Pt will be able to complete toilet transfer with mod assist.  Skilled Therapeutic Interventions/Progress Updates:    Pt greeted in bed after eating lunch. Spanish interpretor present. Reviewed pts multiple precautions prior to mobility with good recall from pt. Supine<sit via logroll technique completed with Max A towards Rt side of bed. Pt requested to engage in bathing/dressing tasks, so OT covered her Lt hand splint with a plastic bag to prevent splint from getting wet. She required Mod A to wash UB, Total for LB with lateral leans utilized for hygiene and brief change. Pt required cues to WB through Lt elbow only or Rt LE only when leaning Rt>Lt as needed. Total A for Teds and gripper socks. Once she donned a hospital gown, pt completed oral care with setup assist. We washed her hair using the shampoo cap and then brushed her hair. Pt left with daughter to style hair, also with NT to assist with returning to bed when finished.   Therapy Documentation Precautions:  Precautions Precautions: Fall, Posterior Hip, Back Precaution Comments: abominal binder and TED Hose for BP mgt, ex fix R UE, full time right forearm splint and left hand/digit clam shell splint; no heavy gripping bilateral hands; TLSO when OOB Required Braces or Orthoses: Spinal Brace,  Splint/Cast Spinal Brace: Thoracolumbosacral orthotic, Applied in sitting position Spinal Brace Comments: when OOB Splint/Cast: modified Meunster splint ; and left hand based IP gutter clam shell Restrictions Weight Bearing Restrictions: Yes RUE Weight Bearing: Non weight bearing LUE Weight Bearing: Non weight bearing RLE Weight Bearing: Weight bearing as tolerated LLE Weight Bearing: Touchdown weight bearing Other Position/Activity Restrictions: may use L hand when splint is on with no heavy gripping. Vital Signs: Therapy Vitals Temp: 97.8 F (36.6 C) Pulse Rate: 64 Resp: 17 BP: (!) 102/53 Patient Position (if appropriate): Sitting Oxygen Therapy SpO2: 97 % O2 Device: Room Air Pain: pt reported being premedicated for pain   ADL: ADL Eating: Set up Where Assessed-Eating: Bed level Grooming: Setup Where Assessed-Grooming: Chair Upper Body Bathing: Maximal assistance Where Assessed-Upper Body Bathing: Chair      Therapy/Group: Individual Therapy  Dajae Kizer A Johnney Scarlata 12/08/2019, 3:55 PM
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
## 2019-12-09 ENCOUNTER — Inpatient Hospital Stay (HOSPITAL_COMMUNITY): Payer: Self-pay | Admitting: Occupational Therapy

## 2019-12-09 ENCOUNTER — Inpatient Hospital Stay (HOSPITAL_COMMUNITY): Payer: Self-pay

## 2019-12-09 DIAGNOSIS — I952 Hypotension due to drugs: Secondary | ICD-10-CM

## 2019-12-09 LAB — GLUCOSE, CAPILLARY
Glucose-Capillary: 135 mg/dL — ABNORMAL HIGH (ref 70–99)
Glucose-Capillary: 149 mg/dL — ABNORMAL HIGH (ref 70–99)
Glucose-Capillary: 148 mg/dL — ABNORMAL HIGH (ref 70–99)
Glucose-Capillary: 181 mg/dL — ABNORMAL HIGH (ref 70–99)

## 2019-12-09 NOTE — Progress Notes (Signed)
Physical Therapy Session Note  Patient Details  Name: Kristen Aguirre MRN: [id number] Date of Birth: 1965/06/17  Today's Date: 12/09/2019 PT Individual Time: 0800-0900 + 1300-1416 PT Individual Time Calculation (min): 60 min  + 76 min  Short Term Goals: Week 1:  PT Short Term Goal 1 (Week 1): Pt will perform bed mobility with minA PT Short Term Goal 2 (Week 1): Pt will perform bed<>chair transfers with minA and LRAD while maintaining WB restrictions PT Short Term Goal 3 (Week 1): Pt will ambulate at least 67ft with minA and LRAD PT Short Term Goal 4 (Week 1): Pt will propel herself at least 20ft wtih minA in manual w/c  Skilled Therapeutic Interventions/Progress Updates:     1st session: Pt received supine in bed, agreeable to PT session, no resting pain but reports fatigue from recent medications. Rest breaks and mobility provided during session as needed. In-person interpreter, [name], present during session as pt is spanish speaking. Donned TED's with totalA for time management. Rolling L<>R with minA using bed rail to don abdominal binder. Supine<>sit with modA via log rolling technique, towards R EOB, assist for trunk management > LE's. Able to sit EOB with supervision and donned quick-draw TLSO while seated EOB with totalA. Stand<>pivot with min/modA from EOB to w/c, towards R side with cues for WB compliance and maintaining precautions. WC transport for time management from her room to main therapy gym. Stand<>pivot with min/modA from w/c to mat table and able to maintain sitting edge of mat table with supervision with 4inch platform under BLE's due to short stature. Performed seated there-ex for purposes of improving overall activity tolerance and to normalize movement in pain free ROM. This included: -1x10 unilateral shldr flex to 160 deg -1x10 elbow flex/ext in antigravity position -1x10 thumb extension (RUE) -1x10 digit flex/ext (RUE)  Then performed repeated sit<>stands  from lowered mat table while placing her L foot on therapist's R foot to monitor WB compliance. She performed 1x10 sit<>stands with minA from mat table with no AD, via face-to-face technique. Then performed 1x5 sit<>stands with similar technique and minA but holding standing for 75second intervals. Note slight posterior lean in standing but pt compliance with WB restrictions throughout. Stand<>pivot with min/modA from mat table to w/c (towards L side) and required modA for repositioning in chair to find comfortable positioning. Note that due to her short stature, she benefits from a platform to push RLE through to assist with repositioning in chair. WC transport back to her room, she remained seated in w/c with plenty of pillows supporting BUE and lumbar back. Needs in reach at end of session.  2nd session: Pt received supine in bed, agreeable to PT session. RN present who reports pt was incontinent of bladder. RN assisted with pericare and brief management while therapist facilitated bed mobility. Pt able to roll R with minA while using bed rail and able to maintain R sidelying with CGA. RN provided dep assist for pericare. Abdominal binder applied while supine via log rolling technique. Supine<>sit with modA with HOB flat, using bed rail. Able to sit EOB with close supervision and donned quick-draw TLSO with totalA for time management while seated EOB. Stand<>pivot transfer with modA towards her L side from EOB to w/c. Therapist provided and attached B platforms to her RW and instructed her on purpose for transfer and gait progressions. WC transport with totalA for time management from her room to main therapy gym. Performed sit<>stand with minA from w/c to PFRW with cues  for proper hand placement. Performed standing trial to fatigue to monitor activity tolerance, she was able to maintain standing with WB compliance and CGA for ~2 minutes prior to fatigue. After seated rest, performed additional sit<>stand with  minA to PFRW, this time performing static standing hopping 4x5 reps on RLE with PFRW support and minA for stability. After additional seated rest, initiated forward hopping on RLE with PFRW and she was able to hop ~59ft with minA with w/c follow for safety. Gait deficits include decreased R foot clearance, decreased R hopping distance, and forward trunk lean. Pt reporting moderate fatigue after 67ft of hopping, requiring prolonged seated rest break for recovery. She then ambulated ~59ft with minA and PFRW with similar gait deficits, effortful. WC transport to return to her room, stand step pivot with minA and PFRW from w/c to EOB, removed abdominal binder and quick-draw TLSO with totalA while saeted EOB. Required modA for sit>supine for BLE and trunk management. Pt positioned comfortably in bed, needs in reach, bed alarm on at end of session.  Therapy Documentation Precautions:  Precautions Precautions: Fall, Posterior Hip, Back Precaution Comments: abominal binder and TED Hose for BP mgt, ex fix R UE, full time right forearm splint and left hand/digit clam shell splint; no heavy gripping bilateral hands; TLSO when OOB Required Braces or Orthoses: Spinal Brace, Splint/Cast Spinal Brace: Thoracolumbosacral orthotic, Applied in sitting position Spinal Brace Comments: when OOB Splint/Cast: modified Meunster splint ; and left hand based IP gutter clam shell Restrictions Weight Bearing Restrictions: Yes RUE Weight Bearing: Non weight bearing LUE Weight Bearing: Non weight bearing RLE Weight Bearing: Weight bearing as tolerated LLE Weight Bearing: Touchdown weight bearing Other Position/Activity Restrictions: may use L hand when splint is on with no heavy gripping.  Therapy/Group: Individual Therapy  Angelo Prindle P Annya Lizana PT 12/09/2019, 7:56 AM
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
## 2019-12-09 NOTE — Progress Notes (Signed)
Occupational Therapy Session Note  Patient Details  Name: Kristen Aguirre MRN: [id number] Date of Birth: August 04, 1965  Today's Date: 12/09/2019 OT Individual Time: 1000-1100 OT Individual Time Calculation (min): 60 min    Short Term Goals: Week 1:  OT Short Term Goal 1 (Week 1): Pt will be able to touch thenar/hypthenar eminence with right index through small finger to encourage functional grasp. OT Short Term Goal 2 (Week 1): Pt will be able to bathe UB sitting sinkside with min assist using long handled sponge. OT Short Term Goal 3 (Week 1): Pt will be able to donn/doff loose fitting shirt with min assist. OT Short Term Goal 4 (Week 1): Pt will be able to complete toilet transfer with mod assist.  Skilled Therapeutic Interventions/Progress Updates:    Patient seated in w/c, states that pain is under control but she is tired.  Interpreter present for session.   UB bathing and change of hospital gown completed w.c level at sink - she requires min A for UB bathing (left UE) and set up, mod a to doff and donn clean gown.  Max A to manage TLSO, dependent for abdominal binder.  Grooming tasks:  Oral care set up, washing face/hands set up, deodorant/under arm care dependent.  Changed left hand stockinette with max A.  SPT w/c to bed with mod A - she demonstrates good understanding of precautions.  Able to tolerate unsupported sitting with CS for UB AROM activities.  Sit to side lying to supine max A.  Completed LB AROM activities in supine with min A.  She remained in bed at close of session with bed alarm set and call bell in hand.    Therapy Documentation Precautions:  Precautions Precautions: Fall, Posterior Hip, Back Precaution Comments: abominal binder and TED Hose for BP mgt, ex fix R UE, full time right forearm splint and left hand/digit clam shell splint; no heavy gripping bilateral hands; TLSO when OOB Required Braces or Orthoses: Spinal Brace, Splint/Cast Spinal Brace:  Thoracolumbosacral orthotic, Applied in sitting position Spinal Brace Comments: when OOB Splint/Cast: modified Meunster splint ; and left hand based IP gutter clam shell Restrictions Weight Bearing Restrictions: Yes RUE Weight Bearing: Non weight bearing LUE Weight Bearing: Non weight bearing RLE Weight Bearing: Weight bearing as tolerated LLE Weight Bearing: Touchdown weight bearing Other Position/Activity Restrictions: may use L hand when splint is on with no heavy gripping.   Therapy/Group: Individual Therapy  Kristen Aguirre 12/09/2019, 7:40 AM

## 2019-12-10 ENCOUNTER — Inpatient Hospital Stay (HOSPITAL_COMMUNITY): Payer: Self-pay

## 2019-12-10 ENCOUNTER — Inpatient Hospital Stay (HOSPITAL_COMMUNITY): Payer: Self-pay | Admitting: Occupational Therapy

## 2019-12-10 ENCOUNTER — Encounter (HOSPITAL_COMMUNITY): Payer: Self-pay | Admitting: Psychology

## 2019-12-10 DIAGNOSIS — S22000A Wedge compression fracture of unspecified thoracic vertebra, initial encounter for closed fracture: Secondary | ICD-10-CM

## 2019-12-10 DIAGNOSIS — S060X9D Concussion with loss of consciousness of unspecified duration, subsequent encounter: Secondary | ICD-10-CM

## 2019-12-10 DIAGNOSIS — S060X9A Concussion with loss of consciousness of unspecified duration, initial encounter: Secondary | ICD-10-CM

## 2019-12-10 LAB — GLUCOSE, CAPILLARY
Glucose-Capillary: 147 mg/dL — ABNORMAL HIGH (ref 70–99)
Glucose-Capillary: 121 mg/dL — ABNORMAL HIGH (ref 70–99)
Glucose-Capillary: 147 mg/dL — ABNORMAL HIGH (ref 70–99)
Glucose-Capillary: 135 mg/dL — ABNORMAL HIGH (ref 70–99)

## 2019-12-10 MED ORDER — METFORMIN HCL 500 MG PO TABS
250.0000 mg | ORAL_TABLET | Freq: Every day | ORAL | Status: DC
  Administered 2019-12-11 – 2019-12-18 (×8): 250 mg via ORAL
  Filled 2019-12-10 (×8): qty 1

## 2019-12-10 NOTE — Progress Notes (Signed)
Patient ID: Kristen Aguirre, female   DOB: 1965/07/27, 54 y.o.   MRN: [id number]  Met with pt along with PT and interpreter to inform of team conference goals min assist level and target discharge date of 10/28. Discussed setting up family education pt reports her daughter can come Tuesday 10/26 @ 10:00 am. She will verify tonight when she talks with her. Will work on equipment needs. Continue to work on discharge needs.
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
## 2019-12-10 NOTE — Progress Notes (Signed)
Physical Therapy Session Note  Patient Details  Name: Kristen Aguirre MRN: [id number] Date of Birth: [birth year]  Today's Date: 12/10/2019 PT Individual Time: 1300-1415 PT Individual Time Calculation (min): 75 min   Short Term Goals: Week 1:  PT Short Term Goal 1 (Week 1): Pt will perform bed mobility with minA PT Short Term Goal 2 (Week 1): Pt will perform bed<>chair transfers with minA and LRAD while maintaining WB restrictions PT Short Term Goal 3 (Week 1): Pt will ambulate at least 41ft with minA and LRAD PT Short Term Goal 4 (Week 1): Pt will propel herself at least 52ft wtih minA in manual w/c  Skilled Therapeutic Interventions/Progress Updates:    Pt received supine in bed, in-person interpreter [name] present throughout session. Pt denies resting pain, evolves to 6/10 LLE pain with mobility. Rest breaks and emotional support provided for pain management. Pt informed of DC date - 10/28, and CSW present to assist with scheduling family ed for Tuesday, 10/26 with her daughter. Abdominal binder applied supine in bed via log rolling technique. Supine<>sit with min/modA via log rolling and quick-draw TLSO applied with totalA for time management while she sat EOB. Pt reporting need to void, therefore, stand<>pivot transfer with minA towards L side from EOB to [facility]. She maintained standing with minA but required totalA for removing brief. Stand>sit with minA to [facility] where she was continent of bladder. Sit<>stand with minA (compliant with WB restrictions) and she was able to use LUE for pericare. Donned brief and shorts with maxA for time management. Stand<>pivot with minA from [facility] to manual w/c. WC transport to main hallway where focus of remainder of session was interval gait training. She ambulated 47ft + 44ft + 27ft with minA and PFRW! Seated rest breaks provided b/w trials. Pt pleased with progress. Gait deficits include hop-to gait on RLE with decreased R foot clearance. WC transport back  to her room with totalA, stand<>pivot with minA from w/c to EOB. Required modA for sit>supine for BLE and trunk control. She remained semi-reclined in bed with needs in reach, bed alarm on, made comfortable.    Therapy Documentation Precautions:  Precautions Precautions: Fall, Posterior Hip, Back Precaution Comments: abominal binder and TED Hose for BP mgt, ex fix R UE, full time right forearm splint and left hand/digit clam shell splint; no heavy gripping bilateral hands; TLSO when OOB Required Braces or Orthoses: Spinal Brace, Splint/Cast Spinal Brace: Thoracolumbosacral orthotic, Applied in sitting position Spinal Brace Comments: when OOB Splint/Cast: modified Meunster splint ; and left hand based IP gutter clam shell Restrictions Weight Bearing Restrictions: Yes RUE Weight Bearing: Non weight bearing LUE Weight Bearing: Non weight bearing RLE Weight Bearing: Weight bearing as tolerated LLE Weight Bearing: Touchdown weight bearing Other Position/Activity Restrictions: may use L hand when splint is on with no heavy gripping.  Therapy/Group: Individual Therapy  Floyd Lusignan P [name] PT 12/10/2019, 3:46 PM
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
## 2019-12-10 NOTE — Progress Notes (Signed)
Occupational Therapy Session Note  Patient Details  Name: Kristen Aguirre MRN: [id number] Date of Birth: 08/20/1965  Today's Date: 12/10/2019 OT Individual Time: 1000-1100 OT Individual Time Calculation (min): 60 min    Short Term Goals: Week 1:  OT Short Term Goal 1 (Week 1): Pt will be able to touch thenar/hypthenar eminence with right index through small finger to encourage functional grasp. OT Short Term Goal 2 (Week 1): Pt will be able to bathe UB sitting sinkside with min assist using long handled sponge. OT Short Term Goal 3 (Week 1): Pt will be able to donn/doff loose fitting shirt with min assist. OT Short Term Goal 4 (Week 1): Pt will be able to complete toilet transfer with mod assist.  Skilled Therapeutic Interventions/Progress Updates:    Pt received in wc requesting to perform self care. Pt positioned at the sink so she could engage in grooming with set up A.  She then completed stand pivot to bed with min A using precautions well. Once sitting EOB, total A to doff TLSO and abdominal binder. With L hand covered with plastic bag she bathed UB with min A and her thighs.  Donned a dress in an overhead manner only needing min a to adjust around her body.  From EOB, engaged in AROM exercises: -B shoulder abduction with external rotation -finger prehension to reach thumb towards each finger  -R overhead tricep extension -B knee extension - B dorsi/plantar flexion   Pt then moved to sidelying with min A and then to supine maintaining back precautions.  Pt set up in bed with all needs met, alarm set.   Therapy Documentation Precautions:  Precautions Precautions: Fall, Posterior Hip, Back Precaution Comments: abominal binder and TED Hose for BP mgt, ex fix R UE, full time right forearm splint and left hand/digit clam shell splint; no heavy gripping bilateral hands; TLSO when OOB Required Braces or Orthoses: Spinal Brace, Splint/Cast Spinal Brace: Thoracolumbosacral  orthotic, Applied in sitting position Spinal Brace Comments: when OOB Splint/Cast: modified Meunster splint ; and left hand based IP gutter clam shell Restrictions Weight Bearing Restrictions: Yes RUE Weight Bearing: Non weight bearing LUE Weight Bearing: Non weight bearing RLE Weight Bearing: Weight bearing as tolerated LLE Weight Bearing: Touchdown weight bearing Other Position/Activity Restrictions: may use L hand when splint is on with no heavy gripping.    Vital Signs: Therapy Vitals Temp: 97.8 F (36.6 C) Pulse Rate: 67 Resp: 18 BP: (!) 101/56 Patient Position (if appropriate): Lying Oxygen Therapy SpO2: 97 % O2 Device: Room Air Pain: Pain Assessment Pain Scale: 0-10 Pain Score: 7  Faces Pain Scale: Hurts little more Pain Type: Acute pain Pain Location: Hand Pain Orientation: Right Pain Descriptors / Indicators: Sharp Pain Frequency: Intermittent Pain Onset: On-going Patients Stated Pain Goal: 3 Pain Intervention(s): Medication (See eMAR) Multiple Pain Sites: Yes 2nd Pain Site Pain Score: 7 Wong-Baker Pain Rating: Hurts even more Pain Type: Surgical pain Pain Location: Hip Pain Orientation: Left Pain Descriptors / Indicators: Aching Pain Frequency: Intermittent Pain Onset: Progressive Pain Intervention(s): Medication (See eMAR) 3rd Pain Site Pain Score: 7 Pain Type: Surgical pain Pain Location: Hand Pain Orientation: Left Pain Descriptors / Indicators: Aching Pain Onset: Gradual Pain Frequency: Constant Pain Intervention(s): Medication (See eMAR) ADL: ADL Eating: Set up Where Assessed-Eating: Bed level Grooming: Setup Where Assessed-Grooming: Chair Upper Body Bathing: Maximal assistance Where Assessed-Upper Body Bathing: Chair   Therapy/Group: Individual Therapy  [place] 12/10/2019, 9:37 AM

## 2019-12-10 NOTE — Patient Care Conference (Signed)
Inpatient RehabilitationTeam Conference and Plan of Care Update Date: 12/10/2019   Time: 11:08 AM    Patient Name: Kristen Aguirre      Medical Record Number: [id number]  Date of Birth: 1965/05/27 Sex: Female         Room/Bed: 4M04C/4M04C-01 Payor Info: Payor: MEDICAID POTENTIAL / Plan: MEDICAID POTENTIAL / Product Type: *No Product type* /    Admit Date/Time:  12/04/2019  5:11 PM  Primary Diagnosis:  Multiple injuries due to trauma  Hospital Problems: Principal Problem:   Multiple injuries due to trauma Active Problems:   Compression fracture of body of thoracic vertebra (HCC)   Drug induced constipation   Diabetes mellitus, new onset (HCC)   Acute blood loss anemia   Post-operative pain   Drug-induced hypotension    Expected Discharge Date: Expected Discharge Date: 12/18/19  Team Members Present: Physician leading conference: Dr. Maryla Morrow Care Coodinator Present: Chana Bode, RN, BSN, CRRN;Becky Dupree, LCSW Nurse Present: Other (comment) Lupita Dawn, RN) PT Present: Wynelle Link, PT OT Present: Perrin Maltese, OT PPS Coordinator present : Fae Pippin, [place], PT     Current Status/Progress Goal Weekly Team Focus  Bowel/Bladder   continent with incontience urgency; LBM: 10/19  time toilet while awake  assist with toileting need prn   Swallow/Nutrition/ Hydration             ADL's   Min to mod assist for UB selfcare with total assist for LB selfcare,  Mod assist for transfers with the platform RW  min to mod assist overall  selfcare retraining, transfer training, AE education, DME education, pt/family education, balance retraining   Mobility   modA bed mobility, min/modA stand<>pivot transfers, gait 5-10 ft minA PFRW  CGA/minA goals  Precaution education, pain management, functional transfer training, gait training, standing tolerance   Communication             Safety/Cognition/ Behavioral Observations            Pain   c/o  pain to RUE, LLE; scheduled robaxin, gabapentin, tylenol  pain level <5/10  assess pain QS and prn   Skin   RUE external fixator, L hand compression wrap, L hip incision with suture, LLE incision with sutures OTA  remain free of new skin breakdown/infection  assess skin QS and prn     Discharge Planning:  HOme with family members providing 24 hr care. Pt is struggling with pain issues. Pt is a uninsured and will not get follow up at discharege and unsure what DME can get for her.   Team Discussion: MD follow up on rhabdo; checking labs. Added metformin for DM management. Hypotensive but asymptomatic; monitor. Making great progress despite pain and multi injuries. Maintaining hip precautions but requires max assist to don/doff TLSO brace and binder.  Patient on target to meet rehab goals: yes, able to ambulated 5-10' with platform walker with min assist. Min-mod assist for transfers. Min assist for UB but Mod assist for LB care with min assist goals and mod assist for LB.  *See Care Plan and progress notes for long and short-term goals.   Revisions to Treatment Plan:   Teaching Needs: Transfers, toileting, medications, etc.  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  Barriers to  Discharge: Medical stability;Pending Surgery;Weight bearing restrictions;Wound care;New diabetic;Incontinence;Other (comments)  Barriers to Discharge Comments: No insurance for DME Possible Resolutions to Barriers/Weekly Focus: Therapies, optimize pain meds, follow labs - hb, CK, monitor BP, diabetic edu   Continued Need for Acute Rehabilitation Level of Care: The patient requires daily medical management by a physician with specialized  training in physical medicine and rehabilitation for the following reasons: Direction of a multidisciplinary physical rehabilitation program to maximize functional independence : Yes Medical management of patient stability for increased activity during participation in an intensive rehabilitation regime.: Yes Analysis of laboratory values and/or radiology reports with any subsequent need for medication adjustment and/or medical intervention. : Yes   I attest that I was present, lead the team conference, and concur with the assessment and plan of the team.   Chana Bode B 12/10/2019, 1:35 PM

## 2019-12-10 NOTE — Progress Notes (Signed)
Physical Therapy Session Note  Patient Details  Name: Kristen Aguirre MRN: [id number] Date of Birth: 02-28-1965  Today's Date: 12/10/2019 PT Individual Time: 0800-0900 PT Individual Time Calculation (min): 60 min   Short Term Goals: Week 1:  PT Short Term Goal 1 (Week 1): Pt will perform bed mobility with minA PT Short Term Goal 2 (Week 1): Pt will perform bed<>chair transfers with minA and LRAD while maintaining WB restrictions PT Short Term Goal 3 (Week 1): Pt will ambulate at least 61ft with minA and LRAD PT Short Term Goal 4 (Week 1): Pt will propel herself at least 56ft wtih minA in manual w/c  Skilled Therapeutic Interventions/Progress Updates:     Patient in bed upon PT arrival. Patient alert and agreeable to PT session. Patient reported 6-7/10 pain during session, stated location was "everywhere", RN aware and reported pain medicine was provided earlier this morning. PT provided repositioning, rest breaks, and distraction as pain interventions throughout session. Patient requesting to use the bed pan at beginning of session, offerred BSC, however, patient insisted on using the bed pan for pain management, despite education on benefits of getting up for toileting and positioning to improve pain on BSC.  In-person interpreter present throughout session.   Therapeutic Activity: Bed Mobility: Patient performed rolling R/L with min A-CGA with use of bed rails for bed pan placement, doffing/donning incontinence brief, peri-care, and donning abdominal binder with total A bed level. Donned B TED hose with total A with patient in supine. Utilized bag technique for improved pain tolerance, bags left in the room and labelled. She performed supine to/from sit with mod A for trunk managment. Provided verbal cues for log roll technique to maintain spinal precautions, as patient attempted to perform sitting up forward once LEs off the bed. Educated patient on how to don TLSO while donning  TLSO with total A due to decreased functional use of UEs. Transfers: Patient performed stand pivot bed>w/c with mod A and sit to/from stand x1 with mod A without AD and L foot placed on therapist's foot to monitor adherence to TDWB. Provided verbal cues for forward weight shift, patient able to recall TDWB precautions, but required cues to maintain these precautions with transfers.  MD rounded during session. Recommended dry dressing applied to L hip incision for patient's comfort due to increased pain/itching with abdominal binder over incision. PT aplieed 2 4x4s with tape over incision. Patient reported improved comfort.   Patient in w/c at end of session with breaks locked and all needs within reach.    Therapy Documentation Precautions:  Precautions Precautions: Fall, Posterior Hip, Back Precaution Comments: abominal binder and TED Hose for BP mgt, ex fix R UE, full time right forearm splint and left hand/digit clam shell splint; no heavy gripping bilateral hands; TLSO when OOB Required Braces or Orthoses: Spinal Brace, Splint/Cast Spinal Brace: Thoracolumbosacral orthotic, Applied in sitting position Spinal Brace Comments: when OOB Splint/Cast: modified Meunster splint ; and left hand based IP gutter clam shell Restrictions Weight Bearing Restrictions: Yes RUE Weight Bearing: Non weight bearing LUE Weight Bearing: Non weight bearing RLE Weight Bearing: Weight bearing as tolerated LLE Weight Bearing: Touchdown weight bearing Other Position/Activity Restrictions: may use L hand when splint is on with no heavy gripping.   Therapy/Group: Individual Therapy  Kristen Aguirre L Aleighna Wojtas PT, DPT  12/10/2019, 12:26 PM

## 2019-12-11 ENCOUNTER — Inpatient Hospital Stay (HOSPITAL_COMMUNITY): Payer: Self-pay | Admitting: Occupational Therapy

## 2019-12-11 ENCOUNTER — Inpatient Hospital Stay (HOSPITAL_COMMUNITY): Payer: Self-pay | Admitting: Physical Therapy

## 2019-12-11 ENCOUNTER — Inpatient Hospital Stay (HOSPITAL_COMMUNITY): Payer: Self-pay

## 2019-12-11 LAB — GLUCOSE, CAPILLARY
Glucose-Capillary: 130 mg/dL — ABNORMAL HIGH (ref 70–99)
Glucose-Capillary: 152 mg/dL — ABNORMAL HIGH (ref 70–99)
Glucose-Capillary: 156 mg/dL — ABNORMAL HIGH (ref 70–99)
Glucose-Capillary: 183 mg/dL — ABNORMAL HIGH (ref 70–99)

## 2019-12-11 MED ORDER — GABAPENTIN 300 MG PO CAPS
600.0000 mg | ORAL_CAPSULE | Freq: Three times a day (TID) | ORAL | Status: DC
  Administered 2019-12-11 – 2019-12-18 (×21): 600 mg via ORAL
  Filled 2019-12-11 (×21): qty 2

## 2019-12-11 MED ORDER — GABAPENTIN 300 MG PO CAPS
300.0000 mg | ORAL_CAPSULE | Freq: Once | ORAL | Status: AC
  Administered 2019-12-11: 300 mg via ORAL
  Filled 2019-12-11: qty 1

## 2019-12-11 NOTE — Progress Notes (Signed)
Physical Therapy Session Note  Patient Details  Name: Kristen Aguirre MRN: [id number] Date of Birth: 02/02/1966  Today's Date: 12/11/2019 PT Individual Time: 0800-0900 PT Individual Time Calculation (min): 60 min   Short Term Goals: Week 1:  PT Short Term Goal 1 (Week 1): Pt will perform bed mobility with minA PT Short Term Goal 2 (Week 1): Pt will perform bed<>chair transfers with minA and LRAD while maintaining WB restrictions PT Short Term Goal 3 (Week 1): Pt will ambulate at least 60ft with minA and LRAD PT Short Term Goal 4 (Week 1): Pt will propel herself at least 16ft wtih minA in manual w/c  Skilled Therapeutic Interventions/Progress Updates:    Pt received supine in bed, agreeable to PT session without c/o resting pain however this evolves to 5/10 L hip pain with mobility. Pt was pre-medicated by nursing and rest breaks provided PRN for pain management. In-person interpreter, [name], present throughout session. Applied TED's with totalA while pt supine for time management. Required modA for donning shorts while supine via threading technique. Rolling with CGA towards R and L side with use of bed rail for donning abdominal binder. Supine<>sit with min/modA using log rolling technique and bed rail with HOB flat, assist for trunk management. While seated EOB, had pt direct care for donning quick-draw TLSO which she was able to do well. She was also able to recall 3/3 hip precautions as well as WBing restrictions with min cueing. Donned R shoe with totalA for time management for purposes of protecting R foot during hoppnig and to assist with stability in standing. Sit<>stand with min/modA from low bed height to PFRW with cues for BUE placement and WB precautions. Pt attempting to hop with R shoe on but unable to successfully clear foot from ground; multiple efforts. Therefore, stand>sit with minA for controlled lowering to EOB and removed R shoe. Sit<>stand with in/modA with similar  cueing as above to PFRW and she hopped ~63ft with minA and PFRW to w/c; demo's significantly reduced R foot clearance during hopping and required 2-3 small hops to produce 1 medium step. WC transport remaining distance to main therapy gym for time management. Performed standing balance task while doing PVC puzzle. Sit<>stand with minA from w/c height with pt's L foot under therapists foot to monitor WB compliance. She completed level 1 puzzle with CGA for standing balance, primarily using LUE for manipulating small objects. She then completed level 3 puzzle, requiring minA for standing balance 2/2 fatigue. She was able to maintain standing for a total of 8 minutes while completing the puzzle prior to fatigue. Stand<>sit with minA and required foot stool for her to reposition in chair. WC transport back to her chair with totalA for time management, she remained seated in w/c with pillow support, needs in reach, made comfortable.  Therapy Documentation Precautions:  Precautions Precautions: Fall, Posterior Hip, Back Precaution Comments: abominal binder and TED Hose for BP mgt, ex fix R UE, full time right forearm splint and left hand/digit clam shell splint; no heavy gripping bilateral hands; TLSO when OOB Required Braces or Orthoses: Spinal Brace, Splint/Cast Spinal Brace: Thoracolumbosacral orthotic, Applied in sitting position Spinal Brace Comments: when OOB Splint/Cast: modified Meunster splint ; and left hand based IP gutter clam shell Restrictions Weight Bearing Restrictions: Yes RUE Weight Bearing: Non weight bearing LUE Weight Bearing: Non weight bearing RLE Weight Bearing: Weight bearing as tolerated LLE Weight Bearing: Touchdown weight bearing Other Position/Activity Restrictions: may use L hand when splint is on  with no heavy gripping.  Therapy/Group: Individual Therapy  Deette Revak P Leonie Amacher PT 12/11/2019, 9:00 AM

## 2019-12-11 NOTE — Progress Notes (Addendum)
Subjective:    Patient reports pain in both arms.  Pulling and pinching sensation in right forearm.  Objective: Vital signs in last 24 hours: Temp:  [98.1 F (36.7 C)-98.7 F (37.1 C)] 98.1 F (36.7 C) (10/21 1928) Pulse Rate:  [70-78] 78 (10/21 1928) Resp:  [18-20] 20 (10/21 1928) BP: (103-106)/(58-64) 106/64 (10/21 1928) SpO2:  [94 %] 94 % (10/21 1928)  Intake/Output from previous day: 10/20 0701 - 10/21 0700 In: 540 [P.O.:540] Out: 1300 [Urine:1300] Intake/Output this shift: No intake/output data recorded.  No results for input(s): HGB in the last 72 hours. No results for input(s): WBC, RBC, HCT, PLT in the last 72 hours. No results for input(s): NA, K, CL, CO2, BUN, CREATININE, GLUCOSE, CALCIUM in the last 72 hours. No results for input(s): LABPT, INR in the last 72 hours.  Intact sensation and capillary refill all digits.  Moving all fingers.  Unable to flex right digits into palm.  Compartments soft.  Wounds clean, dry, intact.  No erythema.  No erythema/drainage at pin sites.   Assessment/Plan:    S/p pinning left small finger and ORIF right distal radius.  Continue ROM exercises in digits.  Will check XR.  Can adjust splint on right to free elbow.     Betha Loa 12/11/2019, 8:04 PM
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
## 2019-12-11 NOTE — Progress Notes (Signed)
Occupational Therapy Session Note  Patient Details  Name: Kristen Aguirre MRN: [id number] Date of Birth: [birth year]  Today's Date: 12/11/2019 OT Individual Time: 1000-1101 OT Individual Time Calculation (min): 61 min    Short Term Goals: Week 1:  OT Short Term Goal 1 (Week 1): Pt will be able to touch thenar/hypthenar eminence with right index through small finger to encourage functional grasp. OT Short Term Goal 2 (Week 1): Pt will be able to bathe UB sitting sinkside with min assist using long handled sponge. OT Short Term Goal 3 (Week 1): Pt will be able to donn/doff loose fitting shirt with min assist. OT Short Term Goal 4 (Week 1): Pt will be able to complete toilet transfer with mod assist.  Skilled Therapeutic Interventions/Progress Updates:    Pt sitting up in w/c, reporting no pain at rest, however shooting pains in right FA with gentle AROM of right digits.  Pt agreeable to sinkside self care UB.  Pt brushed teeth and washed face with setup including donning of plastic bag to left hand.  Pt reports she does not want to change clothes or wash LB right now because she feels clean but would like to wash bilateral underarm.  Pt required min assist for left side.  Pt asking to return to bed due to fatigue.  Pt completed SPT using platform walker (BUE) with CGA and good follow through of precautions. Mod assist needed to complete sit to supine using log roll.  Pt then participated in right digital AROM therex including PIP and DIP jt blocking of Index through small x 10 reps and composite flexion/extension x 10 reps and hook fist/extension x 10 reps.  Pt exhibited improved ROM of digits after therex, middle digit the tightest however.  Encouraged pt to continue AROM full fist with good quality of movement x 20 reps each waking hour to prevent joint contractures.  Call bell in reach, bed alarm on.  Therapy Documentation Precautions:  Precautions Precautions: Fall, Posterior Hip,  Back Precaution Comments: abominal binder and TED Hose for BP mgt, ex fix R UE, full time right forearm splint and left hand/digit clam shell splint; no heavy gripping bilateral hands; TLSO when OOB Required Braces or Orthoses: Spinal Brace, Splint/Cast Spinal Brace: Thoracolumbosacral orthotic, Applied in sitting position Spinal Brace Comments: when OOB Splint/Cast: modified Meunster splint ; and left hand based IP gutter clam shell Restrictions Weight Bearing Restrictions: Yes RUE Weight Bearing: Non weight bearing LUE Weight Bearing: Non weight bearing RLE Weight Bearing: Weight bearing as tolerated LLE Weight Bearing: Touchdown weight bearing Other Position/Activity Restrictions: may use L hand when splint is on with no heavy gripping.   Therapy/Group: Individual Therapy  Amie Critchley 12/11/2019, 11:28 AM

## 2019-12-11 NOTE — Progress Notes (Signed)
Physical Therapy Session Note  Patient Details  Name: Kristen Aguirre MRN: [id number] Date of Birth: 11-Sep-1965  Today's Date: 12/11/2019 PT Individual Time: 1320-1431 PT Individual Time Calculation (min): 71 min   Short Term Goals: Week 1:  PT Short Term Goal 1 (Week 1): Pt will perform bed mobility with minA PT Short Term Goal 2 (Week 1): Pt will perform bed<>chair transfers with minA and LRAD while maintaining WB restrictions PT Short Term Goal 3 (Week 1): Pt will ambulate at least 68ft with minA and LRAD PT Short Term Goal 4 (Week 1): Pt will propel herself at least 73ft wtih minA in manual w/c  Skilled Therapeutic Interventions/Progress Updates:   Pt received supine in bed and agreeable to therapy session. Reports need to use bathroom. Rolling R and semi-sidelying to L with min assist while therapist donned abdominal binder total assist (pt already wearing B LE TED hose). Supine>sitting, R EOB, via logroll technique with mod assist primarily for trunk upright. Sitting EOB donned TLSO total assist due to B UE restrictions. L stand pivot EOB>BSC mod assist for lifting and balance while pivoting with pt placing L LE on therapist's foot to monitor compliance with TDWB - pt noted to have difficulty maintaining this while pivoting. Standing with min/mod assist for balance while +2 assist performed total assist LB clothing management and peri-care (continent of bladder). R stand pivot back to EOB as described above - no significant difference in difficulty transfering R compared to L. Therapist provided pt with shorter w/c and lower profile cushion to improve floor-to-seat height to allow pt to participate in w/c propulsion. L stand pivot to w/c as performed to [facility] with pt using light L UE support on w/c armrest for balance as needed. Donned R tennis shoe for improved grip on floor - unable to don L shoe due to increased dorsal foot pain upon attempts. Pt performed R LE and L UE w/c propulsion  ~134ft x2 with seated break between including navigating in/out of ADL apartment for simulated home environment - supervision provided throughout with cuing for improved propulsion technique - pt moves slowly but safely. R stand pivot w/c>EOB as described above from [facility]. Therapist doffed TLSO total assist. Sit>supine via reverse logroll technique with light mod assist for B LE management into the bed. In supine, reviewed the following L LE exercises and educated pt on importance of continuing to perform these daily outside of therapy sessions: - heel slides x10 reps - ankle PF/DF with emphasis on DF movement x20 reps - glute sets x5 reps  Pt left supine in bed with needs in reach, bed alarm on, and pillows therapeutically positioned for support of extremities.  Therapy Documentation Precautions:  Precautions Precautions: Fall, Posterior Hip, Back Precaution Comments: abominal binder and TED Hose for BP mgt, ex fix R UE, full time right forearm splint and left hand/digit clam shell splint; no heavy gripping bilateral hands; TLSO when OOB Required Braces or Orthoses: Spinal Brace, Splint/Cast Spinal Brace: Thoracolumbosacral orthotic, Applied in sitting position Spinal Brace Comments: when OOB Splint/Cast: modified [place] splint ; and left hand based IP gutter clam shell Restrictions Weight Bearing Restrictions: Yes RUE Weight Bearing: Non weight bearing LUE Weight Bearing: Non weight bearing RLE Weight Bearing: Weight bearing as tolerated LLE Weight Bearing: Touchdown weight bearing Other Position/Activity Restrictions: may use L hand when splint is on with no heavy gripping.  Pain:   Premedicated - pt has pain during movement but reports it decreases with rest - provided  frequent rest breaks, repositioning, distraction, and emotional support for pain management.   Therapy/Group: Individual Therapy  Ginny Forth , PT, DPT, CSRS  12/11/2019, 1:11 PM

## 2019-12-12 ENCOUNTER — Inpatient Hospital Stay (HOSPITAL_COMMUNITY): Payer: Self-pay | Admitting: Occupational Therapy

## 2019-12-12 ENCOUNTER — Inpatient Hospital Stay (HOSPITAL_COMMUNITY): Payer: Self-pay

## 2019-12-12 DIAGNOSIS — S52501A Unspecified fracture of the lower end of right radius, initial encounter for closed fracture: Secondary | ICD-10-CM

## 2019-12-12 DIAGNOSIS — S52501D Unspecified fracture of the lower end of right radius, subsequent encounter for closed fracture with routine healing: Secondary | ICD-10-CM

## 2019-12-12 LAB — GLUCOSE, CAPILLARY
Glucose-Capillary: 185 mg/dL — ABNORMAL HIGH (ref 70–99)
Glucose-Capillary: 153 mg/dL — ABNORMAL HIGH (ref 70–99)
Glucose-Capillary: 113 mg/dL — ABNORMAL HIGH (ref 70–99)
Glucose-Capillary: 197 mg/dL — ABNORMAL HIGH (ref 70–99)

## 2019-12-12 LAB — CK: Total CK: 29 U/L — ABNORMAL LOW (ref 38–234)

## 2019-12-12 NOTE — Progress Notes (Signed)
Physical Therapy Weekly Progress Note  Patient Details  Name: Kristen Aguirre MRN: [id number] Date of Birth: 1965/10/07  Beginning of progress report period: December 05, 2019 End of progress report period: December 12, 2019  Today's Date: 12/12/2019 PT Individual Time: 0800-0857 + 1300-1400 PT Individual Time Calculation (min): 57 min  + 60 min  Patient has met 3 of 4 short term goals.  Pt making steady progress towards goals during her rehabilitation. Progress contributed to improved pain management, improved WB/precaution compliance, and improved sitting/standing balance. She is now able to complete rolling L<>R with CGA with use of bed rail, still requires modA for supine>sit via log roll, sit<>stands with minA and PFRW, has ambulated ~10-28f with minA and PFRW, and has propelled manual w/c 1531fwith supervision.   Patient continues to demonstrate the following deficits muscle weakness and decreased standing balance and difficulty maintaining precautions and therefore will continue to benefit from skilled PT intervention to increase functional independence with mobility.  Patient progressing toward long term goals..  Continue plan of care.  PT Short Term Goals Week 1:  PT Short Term Goal 1 (Week 1): Pt will perform bed mobility with minA PT Short Term Goal 2 (Week 1): Pt will perform bed<>chair transfers with minA and LRAD while maintaining WB restrictions PT Short Term Goal 3 (Week 1): Pt will ambulate at least 1065fith minA and LRAD PT Short Term Goal 4 (Week 1): Pt will propel herself at least 77f13fih minA in manual w/c Week 2:  PT Short Term Goal 1 (Week 2): STG = LTG due to ELOS  Skilled Therapeutic Interventions/Progress Updates:     1st session: Pt received supine in bed, agreeable to PT session, no c/o resting pain. In-person interpeter, SoniAlleen Borneesent throughout session. Donned TED"s with totalA for time management while she lay supine. MaxA for donning shorts via  threading. She was able to roll L<>R with supervision with HOB flat while using bed rail, without bed rail she requires minA. Donned abdominal binder via log rolling technique. Supine<>sit with min/modA for primarily trunk management. Once sitting EOB, donned quick-draw TLSO with totalA for time management. Lengthy discussion held with patient regarding DC planning for DME rec's and scheduling family ed. Spoke with pt's daughter via her phone and scheduled family ed Monday at [id number] notified of change of schedule. Pt has transport chair at home and husband is looking for RW but she will require B platforms due to WB restrictions. Will speak with CSW to assess options. Stand<>pivot transfer with minA from EOB to w/c using PFRW, pt compliant with WB restrictions throughout. Donned R shoe with maxA for time management and she propelled herself, initially using R foot and L arm with supervision (>150ft63f hallways, progressing to only using R foot since she wont be able to use UE propulsion as she has a transport chair at home. Remainder of session focused on standing balance while being compliant with WB restrictions. Sit<>stand with minA to PFRW [place] w/c. While standing with CGA and PFRW support, she was able to place clothes pins on and off basketball net, focusing on upward reaching, dynamic standing balance, and standing tolerance. No LOB throughout. WC transport back to her room with totalA for time management, she remained sitting in w/c with needs in reach, pillow supporting RUE.  2nd session: Pt received supine in bed, agreeable to PT session. In-person interpreter, SoniaAlleen Bornesent during session. Rolling L<>R with supervision with bed rail for donning abdominal binder. Supine<>sit  via log rolling towards L EOB with min/modA with HOB flat and no bed rail. Donned TLSO with totalA for time management. Stand<>pivot transfer with minA and PFRW from EOB to w/c. Family providing a there own RW,  therefore, removed platforms from hospital RW and attached to her own RW that was adjusted to fit. She then ambulated ~8f with CGA and PFRW while maintaining appropriate WB restrictions, demo's hop-to gait pattern on RLE with decreased hop length. No LOB noted during straight path gait. WC transport remaining distance to ortho gym for time management. While seated, performed there-ex for RUE including digit rolling and unrolling a washcloth and crunching paper towel balls and placing into a cup; focusing on R digit flex/ext with functional tasks. WC transport back to her room, stand<>pivot with minA and PFRW from w/c to EOB. Removed TLSO and abdominal binder and supine<>sit with minA for LLE management.  She ended session supine in bed with needs in reach, pillows supporting BUE's, bed alarm on.  Therapy Documentation Precautions:  Precautions Precautions: Fall, Posterior Hip, Back Precaution Comments: abominal binder and TED Hose for BP mgt, ex fix R UE, full time right forearm splint and left hand/digit clam shell splint; no heavy gripping bilateral hands; TLSO when OOB Required Braces or Orthoses: Spinal Brace, Splint/Cast Spinal Brace: Thoracolumbosacral orthotic, Applied in sitting position Spinal Brace Comments: when OOB Splint/Cast: modified [place] splint ; and left hand based IP gutter clam shell Restrictions Weight Bearing Restrictions: Yes RUE Weight Bearing: Non weight bearing LUE Weight Bearing: Non weight bearing RLE Weight Bearing: Weight bearing as tolerated LLE Weight Bearing: Touchdown weight bearing Other Position/Activity Restrictions: may use L hand when splint is on with no heavy gripping.  Therapy/Group: Individual Therapy  Ellise Kovack P Halla Chopp PT 12/12/2019, 7:33 AM
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
## 2019-12-12 NOTE — Progress Notes (Signed)
Occupational Therapy Weekly Progress Note  Patient Details  Name: Kristen Aguirre MRN: [id number] Date of Birth: January 31, 1966  Beginning of progress report period: December 05, 2019 End of progress report period: December 12, 2019  Today's Date: 12/12/2019 OT Individual Time: 1002-1105 OT Individual Time Calculation (min): 63 min    Patient has met 2 of 4 short term goals.  Pt progressing steadily, with significant gains made during functional mobility from maxA during squat pivots to minA/CGA during SPT using platform walker.  Pt also exhibits excellent follow through of safety precautions with little cueing needed.  Pt slowly progressing right hand digital ROM however still very guarded despite repetitive education provided on importance of moving digits to prevent joint contractures.  Pt has good composite extension, but very limited flexion and still unable to touch thenar/hypothenar eminence Index-small.  Patient continues to demonstrate the following deficits: muscle weakness and decreased sitting balance, decreased standing balance and decreased balance strategies and therefore will continue to benefit from skilled OT intervention to enhance overall performance with BADL.  Patient progressing toward long term goals..  Continue plan of care.  OT Short Term Goals Week 1:  OT Short Term Goal 1 (Week 1): Pt will be able to touch thenar/hypthenar eminence with right index through small finger to encourage functional grasp. OT Short Term Goal 1 - Progress (Week 1): Progressing toward goal OT Short Term Goal 2 (Week 1): Pt will be able to bathe UB sitting sinkside with min assist using long handled sponge. OT Short Term Goal 2 - Progress (Week 1): Progressing toward goal OT Short Term Goal 3 (Week 1): Pt will be able to donn/doff loose fitting shirt with min assist. OT Short Term Goal 3 - Progress (Week 1): Met OT Short Term Goal 4 (Week 1): Pt will be able to complete toilet transfer  with mod assist. OT Short Term Goal 4 - Progress (Week 1): Met Week 2:  OT Short Term Goal 1 (Week 2): STGs=LTGs dt ELOS  Skilled Therapeutic Interventions/Progress Updates:    Pt sitting up in w/c, no pain per pt, agreeable to OT session.  Applied plastic bag to left hand.  Pt completed oral hygiene and washed face with setup sitting sinkside.  Max assist needed to doff TLSO and abdominal binder.  Pt doffed dress overhead with min assist.  Pt completed UB bathing with min assist.  Pt stood at platform walker with CGA while OT pulled pants and brief down over hips.  Pt returned to sitting and required max assist to doff over feet including TED hose and socks.  Pt bathed anterior upper legs and needing assist to bathe knee to foot bilaterally and periarea.  Max assist to donn brief and pants with pt able to assist pulling over left hip in standing with OT providing CGA at platform walker.  Total dependence needed to donn TED hose and socks.  Pt then transported to ortho gym for splint modification per MD orders right [place] modified to wrist only splint.  Left small extension clamshell splint also modified to accomodate reduced swelling and improve support and protect pins.  Pt returned to room via w/c transport, left with nurse techs present to assist pt back to bed.    Therapy Documentation Precautions:  Precautions Precautions: Fall, Posterior Hip, Back Precaution Comments: abominal binder and TED Hose for BP mgt, ex fix R UE, full time right forearm splint and left hand/digit clam shell splint; no heavy gripping bilateral hands; TLSO when OOB  Required Braces or Orthoses: Spinal Brace, Splint/Cast Spinal Brace: Thoracolumbosacral orthotic, Applied in sitting position Spinal Brace Comments: when OOB Splint/Cast: modified [place] splint ; and left hand based IP gutter clam shell Restrictions Weight Bearing Restrictions: Yes RUE Weight Bearing: Non weight bearing LUE Weight Bearing: Non weight  bearing RLE Weight Bearing: Weight bearing as tolerated LLE Weight Bearing: Touchdown weight bearing Other Position/Activity Restrictions: may use L hand when splint is on with no heavy gripping.   Therapy/Group: Individual Therapy  Ezekiel Slocumb 12/12/2019, 12:43 PM

## 2019-12-13 LAB — GLUCOSE, CAPILLARY
Glucose-Capillary: 133 mg/dL — ABNORMAL HIGH (ref 70–99)
Glucose-Capillary: 122 mg/dL — ABNORMAL HIGH (ref 70–99)
Glucose-Capillary: 138 mg/dL — ABNORMAL HIGH (ref 70–99)
Glucose-Capillary: 161 mg/dL — ABNORMAL HIGH (ref 70–99)
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
## 2019-12-14 LAB — GLUCOSE, CAPILLARY
Glucose-Capillary: 140 mg/dL — ABNORMAL HIGH (ref 70–99)
Glucose-Capillary: 134 mg/dL — ABNORMAL HIGH (ref 70–99)
Glucose-Capillary: 117 mg/dL — ABNORMAL HIGH (ref 70–99)
Glucose-Capillary: 131 mg/dL — ABNORMAL HIGH (ref 70–99)
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
## 2019-12-15 ENCOUNTER — Inpatient Hospital Stay (HOSPITAL_COMMUNITY): Payer: Self-pay

## 2019-12-15 ENCOUNTER — Ambulatory Visit (HOSPITAL_COMMUNITY): Payer: Self-pay

## 2019-12-15 ENCOUNTER — Encounter (HOSPITAL_COMMUNITY): Payer: Self-pay | Admitting: Occupational Therapy

## 2019-12-15 LAB — GLUCOSE, CAPILLARY: Glucose-Capillary: 134 mg/dL — ABNORMAL HIGH (ref 70–99)

## 2019-12-15 LAB — CBC WITH DIFFERENTIAL/PLATELET
Abs Immature Granulocytes: 0.01 10*3/uL (ref 0.00–0.07)
Basophils Absolute: 0 10*3/uL (ref 0.0–0.1)
Basophils Relative: 1 %
Eosinophils Absolute: 0.2 10*3/uL (ref 0.0–0.5)
Eosinophils Relative: 2 %
HCT: 37.6 % (ref 36.0–46.0)
Hemoglobin: 12.1 g/dL (ref 12.0–15.0)
Immature Granulocytes: 0 %
Lymphocytes Relative: 25 %
Lymphs Abs: 1.5 10*3/uL (ref 0.7–4.0)
MCH: 31.3 pg (ref 26.0–34.0)
MCHC: 32.2 g/dL (ref 30.0–36.0)
MCV: 97.2 fL (ref 80.0–100.0)
Monocytes Absolute: 0.5 10*3/uL (ref 0.1–1.0)
Monocytes Relative: 8 %
Neutro Abs: 3.9 10*3/uL (ref 1.7–7.7)
Neutrophils Relative %: 64 %
Platelets: 189 10*3/uL (ref 150–400)
RBC: 3.87 MIL/uL (ref 3.87–5.11)
RDW: 14 % (ref 11.5–15.5)
WBC: 6.2 10*3/uL (ref 4.0–10.5)
nRBC: 0 % (ref 0.0–0.2)

## 2019-12-15 MED ORDER — LIDOCAINE 5 % EX PTCH
1.0000 | MEDICATED_PATCH | CUTANEOUS | Status: DC
  Administered 2019-12-15 – 2019-12-18 (×4): 1 via TRANSDERMAL
  Filled 2019-12-15 (×5): qty 1

## 2019-12-15 NOTE — Progress Notes (Signed)
Physical Therapy Session Note  Patient Details  Name: Kristen Aguirre MRN: [id number] Date of Birth: Sep 16, 1965  Today's Date: 12/15/2019 PT Individual Time: 8638-1771 + 1300-1415 PT Individual Time Calculation (min): 45 min  + 75 min  Short Term Goals: Week 2:  PT Short Term Goal 1 (Week 2): STG = LTG due to ELOS  Skilled Therapeutic Interventions/Progress Updates:     1st session: Pt received supine in bed, agreeable to PT session, reports unrated generalized pain, RN notified after session. In person interpreter, [name], present during session. Focus of session family ed with daughter. Donned TED's with totalA for time management while she lay supine. Daughter arrived, educated on all precautions and asked her to take photo of written precautions that were on pt's whiteboard (daughter speaks [place]). Daughter has background in nursing care, she was educated on abdominal binder and quick-draw TLSO brace when OOB. She assisted in donning brace on pt and she felt comfortable with this. Daughter reports that the ramp is almost complete at home, and should be complete by DC date. Supine<>sit via log rolling technique with modA for trunk to upright. Stand<>pivot transfer with CGA and PFRW from EOB to w/c. She ambulated ~31ft with CGA and PFRW with hop-to gait pattern, maintaining appropriate precautions. Pt c/o nausea, deferring further efforts. Stand<>sit with CGA to w/c and BP assessed, reading 112/91. RN notified after session regarding nausea. She remained seated in w/c with needs in reach, daughter at bedside, all questions and concerns addressed. Daughter reports family feels ready to take pt home as they have been preparing for this for 2 weeks.   2nd session: Pt received supine in bed, daughter at bedside, pt agreeable to PT session. In person interpreter, [name], present during session. BP concerns from prior OT session. Therefore, full orthostatics assessed with abdominal binder and  TED's on. See results below; pt asymptomatic. TED's donned with totalA while supine and abdominal binder applied via log rolling technique; daughter present for ed. Supine<>sit with modA via log rolling, primarily for trunk to upright. Quick-draw TLSO donned while seated EOB with totalA for time management. Sit<>stand with CGA and PFRW, pt compliant with WB restrictions. She was able to maintain standing with close supervision for >5 minutes while discussing with daughter DC planning, no LOB. After brief seated rest break, she ambulated ~21ft with CGA and PFRW from EOB to w/c. She then propelled herself with distant supervision in w/c using RLE on level surfaces from her room to main therapy gym. Performed sit<>stand with CGA to PFRW where she then performed PVC pipe puzzle, primarily using LUE for manipulating objects while therapist provided CGA. She was able to complete puzzle with min cues and able to maintain standing for ~8 minutes while completing prior to fatigue. Stand<>sit with CGA with cues for LUE placement. While seated in w/c, she performed RUE finger crawls on wash cloth and RUE pincer grasp to pick up wash cloth, repeated this several times to emphasize RUE digit ROM. She also was instructed to pick up crunched paper towel balls and hand to therapist with RUE, as well as completing self AROM supination/pronation with RUE. Supination limited to neutral position. WC transport back to her room for time management, stand<>pivot with CGA and PFRW from w/c to EOB. Doffed TLSO and abdominal binder with totalA, required modA for sit>supine for BLE management. She remained semi-reclined in bed at end of session with needs in reach, bed alarm on, made comfortable.   Supine: 107/62 Seated: 104/49 Standing:  106/88 *Asymptomatic  Therapy Documentation Precautions:  Precautions Precautions: Fall, Posterior Hip, Back Precaution Comments: abominal binder and TED Hose for BP mgt, ex fix R UE, full time  right forearm splint and left hand/digit clam shell splint; no heavy gripping bilateral hands; TLSO when OOB Required Braces or Orthoses: Spinal Brace, Splint/Cast Spinal Brace: Thoracolumbosacral orthotic, Applied in sitting position Spinal Brace Comments: when OOB Splint/Cast: modified [place] splint ; and left hand based IP gutter clam shell Restrictions Weight Bearing Restrictions: Yes RUE Weight Bearing: Non weight bearing LUE Weight Bearing: Non weight bearing RLE Weight Bearing: Weight bearing as tolerated LLE Weight Bearing: Touchdown weight bearing Other Position/Activity Restrictions: may use L hand when splint is on with no heavy gripping.  Therapy/Group: Individual Therapy  Jaritza Duignan P Andersyn Fragoso PT 12/15/2019, 7:57 AM

## 2019-12-15 NOTE — Discharge Instructions (Signed)
Inpatient Rehab Discharge Instructions  Elyanah Farino Discharge date and time: No discharge date for patient encounter.   Activities/Precautions/ Functional Status: Activity: TLSO back brace when out of bed.  Touchdown weightbearing left lower extremity with posterior hip precautions.  Nonweightbearing right upper extremity Diet: diabetic diet Wound Care: Routine skin checks Functional status:  ___ No restrictions     ___ Walk up steps independently ___ 24/7 supervision/assistance   ___ Walk up steps with assistance ___ Intermittent supervision/assistance  ___ Bathe/dress independently ___ Walk with walker     _x__ Bathe/dress with assistance ___ Walk Independently    ___ Shower independently ___ Walk with assistance    ___ Shower with assistance ___ No alcohol     ___ Return to work/school ________  Special Instructions: No driving smoking or alcohol    COMMUNITY REFERRALS UPON DISCHARGE:   HOME EXERCISE PROGRAM GIVEN TO PATIENT AND FAMILY  Medical Equipment/Items Ordered:B-PLATFORMS FOR HER ROLLING WALKER & 3 IN 1                                                 Agency/Supplier:[employer] [employer]  [phone]  MATCH GIVEN FOR PRESCRIPTION ASSISTANCE  WILL FOLLOW UP WITH [facility]-HER PCP IN [place]  My questions have been answered and I understand these instructions. I will adhere to these goals and the provided educational materials after my discharge from the hospital.  Patient/Caregiver Signature _______________________________ Date __________  Clinician Signature _______________________________________ Date __________  Please bring this form and your medication list with you to all your follow-up doctor's appointments.

## 2019-12-15 NOTE — Progress Notes (Signed)
Occupational Therapy Session Note  Patient Details  Name: Kristen Aguirre MRN: [id number] Date of Birth: 1966-01-20  Today's Date: 12/15/2019 OT Individual Time: 1000-1108 OT Individual Time Calculation (min): 68 min    Short Term Goals: Week 1:  OT Short Term Goal 1 (Week 1): Pt will be able to touch thenar/hypthenar eminence with right index through small finger to encourage functional grasp. OT Short Term Goal 1 - Progress (Week 1): Progressing toward goal OT Short Term Goal 2 (Week 1): Pt will be able to bathe UB sitting sinkside with min assist using long handled sponge. OT Short Term Goal 2 - Progress (Week 1): Progressing toward goal OT Short Term Goal 3 (Week 1): Pt will be able to donn/doff loose fitting shirt with min assist. OT Short Term Goal 3 - Progress (Week 1): Met OT Short Term Goal 4 (Week 1): Pt will be able to complete toilet transfer with mod assist. OT Short Term Goal 4 - Progress (Week 1): Met  Skilled Therapeutic Interventions/Progress Updates:    Pt sitting on BSC at OT arrival.  Pt reporting feeling sweaty and shivering with mild light headedness, no c/o pain.  OT provided max assist with toileting and pt needing CGA for toilet transfer and ambulation to EOB stand to sit.  OT assessed BP (abdominal binder and TED hose donned) in sitting: 96/72 pulse 70. Standing BP: 81/57.   Assisted pt back to supine with mod assist.  BP taken in supine: 91/69. Notified RN of pts symptoms and BP readings.  Further OT session completed at bed level.  Pts dtr present throughout session for caregiver education.  Educated pts dtr regarding donning/doffing technique abdominal binder and TLSO.  Educated pts dtr regarding pts current precautions status.  Pt assisted in UB/LB dressing and bathing at bed level and OT discussed with dtr on how to assist with these at sitting/standing level as well.  Pt required max-total assist for self care at bed level and pts dtr demonstrated good  follow through of techniques and safety awareness.  BP taken at end of session: 100/30mmHg.  Call bell in reach, bed alarm on.  Therapy Documentation Precautions:  Precautions Precautions: Fall, Posterior Hip, Back Precaution Comments: abominal binder and TED Hose for BP mgt, ex fix R UE, full time right forearm splint and left hand/digit clam shell splint; no heavy gripping bilateral hands; TLSO when OOB Required Braces or Orthoses: Spinal Brace, Splint/Cast Spinal Brace: Thoracolumbosacral orthotic, Applied in sitting position Spinal Brace Comments: when OOB Splint/Cast: modified Meunster splint ; and left hand based IP gutter clam shell Restrictions Weight Bearing Restrictions: Yes RUE Weight Bearing: Non weight bearing LUE Weight Bearing: Non weight bearing RLE Weight Bearing: Weight bearing as tolerated LLE Weight Bearing: Touchdown weight bearing Other Position/Activity Restrictions: may use L hand when splint is on with no heavy gripping.   Therapy/Group: Individual Therapy  Ezekiel Slocumb 12/15/2019, 9:59 AM
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
## 2019-12-16 ENCOUNTER — Inpatient Hospital Stay (HOSPITAL_COMMUNITY): Payer: Self-pay | Admitting: Physical Therapy

## 2019-12-16 ENCOUNTER — Inpatient Hospital Stay (HOSPITAL_COMMUNITY): Payer: Self-pay

## 2019-12-16 ENCOUNTER — Inpatient Hospital Stay (HOSPITAL_COMMUNITY): Payer: Self-pay | Admitting: Occupational Therapy

## 2019-12-16 NOTE — Progress Notes (Signed)
Physical Therapy Session Note  Patient Details  Name: Kristen Aguirre MRN: [id number] Date of Birth: February 20, 1966  Today's Date: 12/16/2019 PT Individual Time: 1001-1046 PT Individual Time Calculation (min): 45 min   Short Term Goals: Week 1:  PT Short Term Goal 1 (Week 1): Pt will perform bed mobility with minA PT Short Term Goal 2 (Week 1): Pt will perform bed<>chair transfers with minA and LRAD while maintaining WB restrictions PT Short Term Goal 3 (Week 1): Pt will ambulate at least 79ft with minA and LRAD PT Short Term Goal 4 (Week 1): Pt will propel herself at least 78ft wtih minA in manual w/c Week 2:  PT Short Term Goal 1 (Week 2): STG = LTG due to ELOS  Skilled Therapeutic Interventions/Progress Updates:    pt received in [facility] nd agreeable to therapy. Interpreter present. Pt reported when PT entered room she needed to use the restroom, pt taken to restroom entry in [facility] for time, directed in stand with grab bar use, pivot on full weight bearing leg to complete toilet transfer min A, dependent for lowering pants. Pt able to have large BM and dependent for hygiene. Pt then directed in stand pivot transfer to return to [facility] at min A  And extra time 2/2 pain. Pt requested to return to room and ask nursing for pain medication. Nursing medicated patient during session for pain. Pt taken to gym in [facility], dependent for time management and energy conservation. Pt directed in x2 Sit to stand to B platform walker from [facility] at min A and able to stand within weight bearing precautions. Pt then directed to attempt short distance gait training however pt reported she needed to have additional BM and needed to return to room quickly. Pt taken back to room in [facility] dependent for time, and directed in stand pivot transfer from [facility] min A with grab bar use, dependent for lowering pants, able to have additional BM, dependent for hygiene and max A for pulling pants and brief over hips in standing. Pt directed in Sit to  stand from toilet to B platform walker and gait training within WB status short distance to bedside from toilet 10' min A for stability, and stand>sit to bedside and mod A sit>supine for BLE management. Pt denied to complete PT session at this time 2/2 diarrhea and frequent need to use restroom. Pt left in bed, interpreter present, nursing made aware, All needs in reach and in good condition. Call light in hand.    Therapy Documentation Precautions:  Precautions Precautions: Fall, Posterior Hip, Back Precaution Comments: abominal binder and TED Hose for BP mgt, ex fix R UE, full time right forearm splint and left hand/digit clam shell splint; no heavy gripping bilateral hands; TLSO when OOB Required Braces or Orthoses: Spinal Brace, Splint/Cast Spinal Brace: Thoracolumbosacral orthotic, Applied in sitting position Spinal Brace Comments: when OOB Splint/Cast: modified Meunster splint ; and left hand based IP gutter clam shell Restrictions Weight Bearing Restrictions: Yes RUE Weight Bearing: Non weight bearing LUE Weight Bearing: Non weight bearing RLE Weight Bearing: Weight bearing as tolerated LLE Weight Bearing: Touchdown weight bearing Other Position/Activity Restrictions: may use L hand when splint is on with no heavy gripping. General: PT Amount of Missed Time (min): 15 Minutes PT Missed Treatment Reason: Pain;Patient ill (Comment) (multiple BMs and request to return to bed)    Therapy/Group: Individual Therapy  Barbaraann Faster 12/16/2019, 1:15 PM

## 2019-12-16 NOTE — Progress Notes (Signed)
Physical Therapy Session Note  Patient Details  Name: Kristen Aguirre MRN: [id number] Date of Birth: 02-Mar-1965  Today's Date: 12/16/2019 PT Individual Time: 0800-0857 PT Individual Time Calculation (min): 57 min   Short Term Goals: Week 2:  PT Short Term Goal 1 (Week 2): STG = LTG due to ELOS  Skilled Therapeutic Interventions/Progress Updates:    Pt received supine in bed, awake and agreeable to PT session, reports generalized and unrated pain, she reports receiving pain medication prior to PT arrival. In-person interpreter, [name], present throughout session. Rest breaks and repositioning provided for pain management. Pt with abdominal binder and TEDs donned on arrival. Supine<>sit via log rolling with HOB elevated ~35deg with close supervision, compliant with WB/hip precautions. Donned quick-draw TLSO while seated EOB with totalA. Pt reporting need to void, therefore, sit<>stand with CGA to PFRW from EOB height and ambulated ~36ft with CGA and PFRW from EOB to 3-1 BSC placed over toilet. Stand>sit with CGA for safety, compliant with WB restrictions throughout. Pt was continent of bladder, charted. She required totalA for posterior pericare while standing but she was able to use LUE for frontal pericare. MaxA for donning a new brief while standing; she demonstrated good standing balance while completing these with PFRW support. Ambulated back to her EOB (~73ft) with CGA and PFRW and then completed stand<>pivot with CGA and PFRW from EOB to w/c. WC transport for time management from her room to orthogym to practice car transfers. She completed car transfer, requiring minA for LLE management, otherwise demonstrated appropriate technique and good understanding. WC transport back to her room with totalA for time management, she remained seated in w/c with needs in reach, pillows provided for support and comfort.   Therapy Documentation Precautions:  Precautions Precautions: Fall, Posterior  Hip, Back Precaution Comments: abominal binder and TED Hose for BP mgt, ex fix R UE, full time right forearm splint and left hand/digit clam shell splint; no heavy gripping bilateral hands; TLSO when OOB Required Braces or Orthoses: Spinal Brace, Splint/Cast Spinal Brace: Thoracolumbosacral orthotic, Applied in sitting position Spinal Brace Comments: when OOB Splint/Cast: modified Meunster splint ; and left hand based IP gutter clam shell Restrictions Weight Bearing Restrictions: Yes RUE Weight Bearing: Non weight bearing LUE Weight Bearing: Non weight bearing RLE Weight Bearing: Weight bearing as tolerated LLE Weight Bearing: Touchdown weight bearing Other Position/Activity Restrictions: may use L hand when splint is on with no heavy gripping.   Therapy/Group: Individual Therapy  Makar Slatter P Brittinee Risk PT 12/16/2019, 8:56 AM

## 2019-12-16 NOTE — Progress Notes (Signed)
Occupational Therapy Session Note  Patient Details  Name: Kristen Aguirre MRN: [id number] Date of Birth: 02/16/1966  Today's Date: 12/16/2019 OT Individual Time: 0370-4888 OT Individual Time Calculation (min): 73 min    Short Term Goals: Week 1:  OT Short Term Goal 1 (Week 1): Pt will be able to touch thenar/hypthenar eminence with right index through small finger to encourage functional grasp. OT Short Term Goal 1 - Progress (Week 1): Progressing toward goal OT Short Term Goal 2 (Week 1): Pt will be able to bathe UB sitting sinkside with min assist using long handled sponge. OT Short Term Goal 2 - Progress (Week 1): Progressing toward goal OT Short Term Goal 3 (Week 1): Pt will be able to donn/doff loose fitting shirt with min assist. OT Short Term Goal 3 - Progress (Week 1): Met OT Short Term Goal 4 (Week 1): Pt will be able to complete toilet transfer with mod assist. OT Short Term Goal 4 - Progress (Week 1): Met  Skilled Therapeutic Interventions/Progress Updates:    Pt supine in bed, no c/o pain throughout session, agreeable to OT session.  Interpreter present throughout for communication assist.  Pt completed supine to sit with min assist.  Max assist needed to donn abdominal binder and TLSO.  Stand pivot transfer EOB to w/c using bilateral platform walker needing CGA.  Pt transported sinkside and waterproof bag applied to left hand with max assist.  Pt brushed teeth with setup in sitting.  Pt doffed TLSO with max assist and overhead shirt with min assist.  Pt bathed UB with min assist for LUE.  Pt deferred bathing LB today.  Pt donned overhead shirt with min assist to thread over external fixator RUE.  Pt needed max assist to donn TLSO.  Pt transported to gym at tabletop to complete right digital therex including: Self PROM x 10 second holds with max VCs to promote full stretch each digit into composite flexion, PIP and DIP jt AROM blocking x 20 reps each, and AROM composite  flexion/extension with small dowel placed at palm to facilitate functional grasp x 20 reps.  Pt transported back to room, requesting back to bed per pt.  Pt completed stand pivot transfer w/c to EOB with platform walker with CGA. TLSO doffed with max assist, and sit to supine with mod assist.  Call bell in reach, bed alarm on.  Improved composite flexion noted in Index-small, however still unable to touch thenar eminence. Pt tolerates self PROM much better than manual PROM by therapist.  Limited thumb AROM IP flexion present.  Therapy Documentation Precautions:  Precautions Precautions: Fall, Posterior Hip, Back Precaution Comments: abominal binder and TED Hose for BP mgt, ex fix R UE, full time right forearm splint and left hand/digit clam shell splint; no heavy gripping bilateral hands; TLSO when OOB Required Braces or Orthoses: Spinal Brace, Splint/Cast Spinal Brace: Thoracolumbosacral orthotic, Applied in sitting position Spinal Brace Comments: when OOB Splint/Cast: modified Meunster splint ; and left hand based IP gutter clam shell Restrictions Weight Bearing Restrictions: Yes RUE Weight Bearing: Non weight bearing LUE Weight Bearing: Non weight bearing RLE Weight Bearing: Weight bearing as tolerated LLE Weight Bearing: Touchdown weight bearing Other Position/Activity Restrictions: may use L hand when splint is on with no heavy gripping.   Therapy/Group: Individual Therapy  Ezekiel Slocumb 12/16/2019, 3:52 PM
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
## 2019-12-16 NOTE — Progress Notes (Signed)
Patient ID: Kristen Aguirre, female   DOB: October 23, 1965, 54 y.o.   MRN: [id number] Spoke with daughter via telephone with pt and interpreter she reports she will come in tomorrow at 10:00 since missed today. Will have OT scheduled since saw PT. Discussed of [employer] only having one platform and if wanted to go to retail store could pick up both. Pt declined the one platform [employer] tried to deliver to room today. Also informed daughter of the financial form needed to be completed for [employer]. She will get tomorrow when here.

## 2019-12-17 ENCOUNTER — Inpatient Hospital Stay (HOSPITAL_COMMUNITY): Payer: Self-pay

## 2019-12-17 ENCOUNTER — Encounter (HOSPITAL_COMMUNITY): Payer: Self-pay | Admitting: Occupational Therapy
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
## 2019-12-17 NOTE — Discharge Summary (Signed)
Physical Therapy Discharge Summary  Patient Details  Name: Kristen Aguirre MRN: [id number] Date of Birth: 1965/05/19  Today's Date: 12/17/2019 PT Individual Time: 9613-4827 + 1300-1415 PT Individual Time Calculation (min): 57 min  + 75 min   Patient has met 9 of 10 long term goals due to improved activity tolerance, improved balance, increased strength, increased range of motion, decreased pain and ability to compensate for deficits.  Patient to discharge at an ambulatory level Min Assist.  Assist required mostly due to several weight-bearing, hip, and back precautions. Family ed has been performed and family comfortable with necessary care. Patient's care partner is independent to provide the necessary physical assistance at discharge.  Reasons goals not met: CGA goal set for bed mobility, however she required min assist due to precautions and pain. Family training provided with pt's daughter who felt comfortable with assist level.  Recommendation:  Patient will benefit from ongoing skilled PT services in outpatient setting to continue to advance safe functional mobility, address ongoing impairments in strength, balance, and gait deficits in order to minimize fall risk.  Equipment: No equipment provided. Family owns a RW and they are purchasing B platforms via self pay.  Reasons for discharge: treatment goals met and discharge from hospital  Patient/family agrees with progress made and goals achieved: Yes  PT Discharge Precautions/Restrictions Precautions Precautions: Fall;Posterior Hip;Back Precaution Comments: abominal binder and TED Hose for BP mgt, ex fix R UE, full time right forearm splint and left hand/digit clam shell splint; no heavy gripping bilateral hands; TLSO when OOB Required Braces or Orthoses: Spinal Brace;Splint/Cast Spinal Brace: Thoracolumbosacral orthotic;Applied in sitting position Spinal Brace Comments: when OOB Splint/Cast: right wrist splint; left  hand based IP clam shell Other Brace: L hand with bandaging, possible small splinting s/p surgery 10/12 Restrictions Weight Bearing Restrictions: Yes RUE Weight Bearing: Non weight bearing (Able to WB through elbow) LUE Weight Bearing: Non weight bearing (can WB through elbow) RLE Weight Bearing: Weight bearing as tolerated LLE Weight Bearing: Touchdown weight bearing Other Position/Activity Restrictions: may use L hand when splint is on with no heavy gripping. Pain Pain Assessment Pain Scale: 0-10 Pain Score: 4  Vision/Perception  Perception Perception: Within Functional Limits Praxis Praxis: Intact  Cognition Overall Cognitive Status: Within Functional Limits for tasks assessed Arousal/Alertness: Awake/alert Orientation Level: Oriented X4 Attention: Sustained;Selective Sustained Attention: Appears intact Selective Attention: Appears intact Memory: Appears intact Awareness: Appears intact Problem Solving: Appears intact Safety/Judgment: Appears intact Sensation Sensation Light Touch: Appears Intact Hot/Cold: Appears Intact Proprioception: Appears Intact Stereognosis: Not tested Coordination Gross Motor Movements are Fluid and Coordinated: No Fine Motor Movements are Fluid and Coordinated: No Coordination and Movement Description: Pain inhibition prohibiting normal movement patterns. Motor  Motor Motor: Within Functional Limits Motor - Skilled Clinical Observations: pain inhibition  Mobility Bed Mobility Bed Mobility: Rolling Right;Rolling Left;Sit to Supine;Supine to Sit Rolling Right: Supervision/verbal cueing Rolling Left: Supervision/Verbal cueing Right Sidelying to Sit: Minimal Assistance - Patient > 75% Supine to Sit: Minimal Assistance - Patient > 75% Sit to Supine: Minimal Assistance - Patient > 75% Transfers Transfers: Stand Pivot Transfers;Stand to Sit;Sit to Stand Sit to Stand: Contact Guard/Touching assist (with PFRW) Stand to Sit: Contact  Guard/Touching assist (with PFRW) Stand Pivot Transfers: Contact Guard/Touching assist (with PFRW) Stand Pivot Transfer Details: Verbal cues for precautions/safety;Verbal cues for safe use of DME/AE;Verbal cues for technique;Verbal cues for sequencing Transfer (Assistive device): Bilateral platform walker Locomotion  Gait Ambulation: Yes Gait Assistance: Supervision/Verbal cueing Gait Distance (Feet): 54 Feet  Assistive device: Bilateral platform walker Gait Assistance Details: Verbal cues for safe use of DME/AE;Verbal cues for precautions/safety;Verbal cues for gait pattern;Verbal cues for technique;Verbal cues for sequencing Gait Gait: Yes Gait Pattern: Impaired Gait Pattern:  (Hop to gait pattern on RLE with decreased R foot clearance, maintaining weight bearing precautions throughout) Gait velocity: significantly decreased Stairs / Additional Locomotion Stairs: No Wheelchair Mobility Wheelchair Mobility: Yes Wheelchair Assistance: Chartered loss adjuster: Right lower extremity Wheelchair Parts Management: Supervision/cueing Distance: >184ft  Trunk/Postural Assessment  Cervical Assessment Cervical Assessment: Within Functional Limits Thoracic Assessment Thoracic Assessment: Within Functional Limits Lumbar Assessment Lumbar Assessment: Within Functional Limits Postural Control Postural Control: Within Functional Limits  Balance Balance Balance Assessed: Yes Static Sitting Balance Static Sitting - Balance Support: Feet supported Static Sitting - Level of Assistance: 7: Independent Dynamic Sitting Balance Dynamic Sitting - Balance Support: During functional activity;Feet unsupported Dynamic Sitting - Level of Assistance: 5: Stand by assistance Static Standing Balance Static Standing - Balance Support: No upper extremity supported Static Standing - Level of Assistance: 5: Stand by assistance Dynamic Standing Balance Dynamic Standing - Balance  Support: During functional activity Dynamic Standing - Level of Assistance: 4: Min assist Extremity Assessment  RUE Assessment RUE Assessment: Exceptions to [facility] Active Range of Motion (AROM) Comments: Right shoulder, elbow WNL; DNT FA or wrist dt precautions; digits stiff compositely in flexion and limited by pain but improved since IE; able to bring tips of fingers approx 2 inches from thenar/hypothenar eminence General Strength Comments: DNT due to precautions LUE Assessment LUE Assessment: Exceptions to [facility] Active Range of Motion (AROM) Comments: WNL shoulder, elbow, wrist, and digits I-IV; DNT digit V due to precautions General Strength Comments: DNT per precautions RLE Assessment RLE Assessment: Exceptions to [facility] General Strength Comments: Grosssly 4+/5, pain limiting LLE Assessment LLE Assessment: Exceptions to [facility] General Strength Comments: ankle 5/5, knee flex to 60deg due to pain, knee ext 4/5  Skilled Intervention: 1st session: Pt received supine in bed, agreeable to PT session. In-house interpreter present throughout session for communication assist. Donned TED's with totalA while supine. Able to roll L<>R with supervision for donning abdominal binder. Supine<>sit with minA for trunk control to upright via log rolling technique. Quick-draw TLSO donned totalA while seated EOB, pt able to direct care for management of brace. Sit<>stand with supervision and PFRW where she ambulated ~49ft in room with CGA and PFRW to w/c, hop-to gait pattern on RLE. WC transport for time management from her room to main hallway. She ambulated 57ft + 35ft with close supervision and PFRW, demo's hop-to gait pattern on RLE and compliant with WB/precautions throughout. No LOB or knee buckling however she reports moderate fatigue after effort, requiring extended seated rest break for recovery. She then performed dynamic standing balance task while tossing horseshoe's with LUE and PFRW support, performed with  CGA for stability. WC transport back to her room where she remained seated in w/c at end of session, needs in reach.  2nd session: Pt received supine in bed, agreeable to PT session. In-house interpreter present throughout session for communication assist. Abdominal binder applied with totalA via log rolling with supervision in bed. Supine<>sit via log rolling with HOB flat, requiring minA for trunk control. Able to sit EOB indep without BUE support. Stand<>pivot with minA from EOB to w/c with cues for compliance of weight bearing restrictions. Did not use PFRW as pt may not have at time of DC but family will be purchasing soon after. WC transport for time  management from her room to dayroom gym. Developed HEP in [place] and reviewed with her, printout provided. Performed blocked practice transfers, x6 bouts, from EOB to mat table with minA and no AD. Daughter arriving soon thereafter and reviewed car transfers with her. PT performed 1st car transfer for demonstration purposes, was completed with minA. Daughter performed 2nd car transfer with PT providing close supervision, daughter and pt felt comfortable with technique. WC transport back ot her room and spent time educating patient and family on discharge planning, home safety training, review of hip/weightbearing precautions. All questions and concerns addressed, family feels ready for safe DC. Daughter performed stand<>pivot transfer with patient with safe technique. Daughter also removed TLSO brace and abdominal binder. She also assisted patient with sit>supine via log rolling. Ended session with patient supine in bed, needs in reach, bed alarm on.   Caelyn Route P Evart Mcdonnell PT 12/17/2019, 12:49 PM
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
## 2019-12-17 NOTE — Progress Notes (Signed)
Occupational Therapy Discharge Summary  Patient Details  Name: Kristen Aguirre MRN: [id number] Date of Birth: 01/06/1966  Today's Date: 12/17/2019 OT Individual Time: 1000-1100 OT Individual Time Calculation (min): 60 min    Patient has met 5 of 8 long term goals due to improved activity tolerance, improved balance, postural control, ability to compensate for deficits and functional use of  LEFT upper extremity.  Patient to discharge at overall min to Mod Assist level.  Patient's care partner is independent to provide the necessary physical assistance at discharge.    Reasons goals not met: Goals not met due to multiple safety precautions and difficulty reaching LB with inability to use AE due to limited hand function secondary to splinting and precautions.  Pt also limited by pain in LLE and and right hand.  Pt also not cleared for showers currently therefore shower bench transfer not addressed.  Pts dtr will be available to assist pt as needed at home.  Recommendation:  Patient will benefit from HEP follow-through right hand P/AROM due to pt does not have health insurance for outpatient follow up.  Equipment: BSC   Reasons for discharge: treatment goals met and discharge from hospital  Patient/family agrees with progress made and goals achieved: Yes   Skilled Intervention: Pt sitting up in w/c with pts dtr present for family education.  Interpreter present for communication. Pts dtr reports she [place] platforms [place], but they were out of stock. Educated pt and dtr regarding functional transfer technique without AD.  Pt requesting to toilet.  OT demonstrated stand pivot transfer min assist with pt w/c to [facility].  Toileting completed with max assist provided by pts dtr. Pts dtr provided max assist for LB bathing and dressing including donning/doffing TED Hose with pt sitting at [facility] and min assist for UB bathing and dressing including doffing/donning  of abdominal binder and TLSO independently. Pts dtr provided min assist during stand pivot BSC to w/c with good technique demonstrated.  Pt transported via w/c to sinkside to complete oral hygiene with setup.  CSW arrived near end of OT session informing pt of location that has bilateral platforms, pts dtr reports she will purchase this afternoon.  Educated pt and dtr regarding HEP to right hand and provided visual handout. Pt and dtr report good understanding.  Call bell in reach, dtr present in room.      OT Discharge Precautions/Restrictions  Precautions Precautions: Fall;Posterior Hip;Back Precaution Comments: abominal binder and TED Hose for BP mgt, ex fix R UE, full time right forearm splint and left hand/digit clam shell splint; no heavy gripping bilateral hands; TLSO when OOB Required Braces or Orthoses: Spinal Brace;Splint/Cast Spinal Brace: Thoracolumbosacral orthotic;Applied in sitting position Spinal Brace Comments: when OOB Splint/Cast: right wrist splint; left hand based IP clam shell Restrictions Weight Bearing Restrictions: Yes RUE Weight Bearing: Non weight bearing (can weight bear through elbow) LUE Weight Bearing: Non weight bearing (can weight bear through elbow) RLE Weight Bearing: Weight bearing as tolerated LLE Weight Bearing: Touchdown weight bearing Other Position/Activity Restrictions: may use L hand when splint is on with no heavy gripping. Pain Pain Assessment Pain Scale: 0-10 Pain Score: 4  ADL ADL Eating: Set up Where Assessed-Eating: Wheelchair Grooming: Setup Where Assessed-Grooming: Sitting at sink Upper Body Bathing: Moderate assistance Where Assessed-Upper Body Bathing: Wheelchair Lower Body Bathing: Maximal assistance Where Assessed-Lower Body Bathing: Sitting at sink, Standing at sink Upper Body Dressing: Minimal assistance Where Assessed-Upper Body Dressing: Sitting at sink Lower  Body Dressing: Maximal assistance Where Assessed-Lower Body  Dressing: Sitting at sink, Standing at sink Toileting: Maximal assistance Where Assessed-Toileting: Bedside Commode Toilet Transfer: Minimal assistance Toilet Transfer Method: Stand pivot Toilet Transfer Equipment: Bedside commode Tub/Shower Transfer: Not assessed Social research officer, government: Not assessed Social research officer, government Method: Unable to assess Vision Baseline Vision/History: No visual deficits Patient Visual Report: No change from baseline Vision Assessment?: No apparent visual deficits Perception  Perception: Within Functional Limits Praxis Praxis: Intact Cognition Overall Cognitive Status: Within Functional Limits for tasks assessed Arousal/Alertness: Awake/alert Orientation Level: Oriented X4 Sustained Attention: Appears intact Selective Attention: Appears intact Memory: Appears intact Awareness: Appears intact Problem Solving: Appears intact Safety/Judgment: Appears intact Sensation Sensation Light Touch: Appears Intact Hot/Cold: Appears Intact Proprioception: Appears Intact Stereognosis: Not tested Coordination Gross Motor Movements are Fluid and Coordinated: No Fine Motor Movements are Fluid and Coordinated: No Coordination and Movement Description: Pain inhibition prohibiting normal movement patterns. Motor  Motor Motor: Within Functional Limits Mobility  Transfers Sit to Stand: Minimal Assistance - Patient > 75% Stand to Sit: Minimal Assistance - Patient > 75%  Trunk/Postural Assessment  Cervical Assessment Cervical Assessment: Within Functional Limits Thoracic Assessment Thoracic Assessment: Within Functional Limits Lumbar Assessment Lumbar Assessment: Within Functional Limits Postural Control Postural Control: Within Functional Limits  Balance Balance Balance Assessed: Yes Static Sitting Balance Static Sitting - Level of Assistance: 7: Independent Dynamic Sitting Balance Dynamic Sitting - Level of Assistance: 5: Stand by assistance Static  Standing Balance Static Standing - Level of Assistance: 5: Stand by assistance Dynamic Standing Balance Dynamic Standing - Level of Assistance: 4: Min assist Extremity/Trunk Assessment RUE Assessment RUE Assessment: Exceptions to [facility] Active Range of Motion (AROM) Comments: Right shoulder, elbow WNL; DNT FA or wrist dt precautions; digits stiff compositely in flexion and limited by pain but improved since IE; able to bring tips of fingers approx 2 inches from thenar/hypothenar eminence General Strength Comments: DNT due to precautions LUE Assessment LUE Assessment: Exceptions to [facility] Active Range of Motion (AROM) Comments: WNL shoulder, elbow, wrist, and digits I-IV; DNT digit V due to precautions General Strength Comments: DNT per precautions   Caryl Asp Banesa Tristan 12/17/2019, 12:19 PM
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
## 2019-12-17 NOTE — Progress Notes (Signed)
Patient ID: Kristen Aguirre, female   DOB: [birth year], 54 y.o.   MRN: [id number]  Met with daughter who is here for OT education. Informed her to contact [facility] for B-platforms. She will follow up with this. Therapist's to give daughter home exercise program. Daughter voiced will have 24/7 at home and comfortable with care. Will get [facility] for pt to assist with medications. Aware still planning for discharge tomorrow. Updated regarding team conference. See in am to make sure no further questions or concerns.

## 2019-12-18 ENCOUNTER — Other Ambulatory Visit (HOSPITAL_COMMUNITY): Payer: Self-pay | Admitting: Physician Assistant

## 2019-12-18 MED ORDER — ACETAMINOPHEN 325 MG PO TABS: 650.0000 mg | ORAL_TABLET | Freq: Four times a day (QID) | ORAL | Status: DC

## 2019-12-18 MED ORDER — LEVOTHYROXINE SODIUM 50 MCG PO TABS: 50.0000 ug | ORAL_TABLET | Freq: Every day | ORAL | 1 refills | Status: DC

## 2019-12-18 MED ORDER — METFORMIN HCL 500 MG PO TABS: 250.0000 mg | ORAL_TABLET | Freq: Every day | ORAL | 0 refills | Status: DC

## 2019-12-18 MED ORDER — POLYETHYLENE GLYCOL 3350 17 G PO PACK: 17.0000 g | PACK | Freq: Two times a day (BID) | ORAL | 0 refills | Status: DC

## 2019-12-18 MED ORDER — APIXABAN 2.5 MG PO TABS: 2.5000 mg | ORAL_TABLET | Freq: Two times a day (BID) | ORAL | 0 refills | Status: DC

## 2019-12-18 MED ORDER — OXYCODONE HCL 5 MG PO TABS
5.0000 mg | ORAL_TABLET | ORAL | 0 refills | Status: DC | PRN
Start: 2019-12-18 — End: 2020-01-13

## 2019-12-18 MED ORDER — DOCUSATE SODIUM 100 MG PO CAPS: 100.0000 mg | ORAL_CAPSULE | Freq: Two times a day (BID) | ORAL | 0 refills | Status: DC

## 2019-12-18 MED ORDER — FOLIC ACID 1 MG PO TABS: 1.0000 mg | ORAL_TABLET | Freq: Every day | ORAL | 0 refills | Status: DC

## 2019-12-18 MED ORDER — ALBUTEROL SULFATE HFA 108 (90 BASE) MCG/ACT IN AERS: 2.0000 | INHALATION_SPRAY | Freq: Once | RESPIRATORY_TRACT | 0 refills | Status: DC | PRN

## 2019-12-18 MED ORDER — METHOCARBAMOL 750 MG PO TABS: 750.0000 mg | ORAL_TABLET | Freq: Four times a day (QID) | ORAL | 0 refills | Status: DC

## 2019-12-18 MED ORDER — ADULT MULTIVITAMIN W/MINERALS CH: 1.0000 | ORAL_TABLET | Freq: Every day | ORAL | Status: DC

## 2019-12-18 MED ORDER — GABAPENTIN 300 MG PO CAPS: 600.0000 mg | ORAL_CAPSULE | Freq: Three times a day (TID) | ORAL | 0 refills | Status: DC

## 2019-12-18 MED ORDER — VITAMIN D3 25 MCG PO TABS: 2000.0000 [IU] | ORAL_TABLET | Freq: Two times a day (BID) | ORAL | 0 refills | Status: DC

## 2019-12-18 MED ORDER — ASCORBIC ACID 1000 MG PO TABS: 1000.0000 mg | ORAL_TABLET | Freq: Every day | ORAL | 0 refills | Status: DC

## 2019-12-18 MED ORDER — LIDOCAINE 5 % EX PTCH: 1.0000 | MEDICATED_PATCH | CUTANEOUS | 0 refills | Status: DC

## 2019-12-18 MED FILL — LEVOTHYROXINE SODIUM 50 MCG: 50 | 30 days supply | Qty: 30 | Fill #0

## 2019-12-18 MED FILL — FOLIC ACID 1 MG TABS: 1 | 30 days supply | Qty: 30 | Fill #0

## 2019-12-18 MED FILL — METHOCARBAMOL 750 MG TABS: 750 | 30 days supply | Qty: 120 | Fill #0

## 2019-12-18 MED FILL — metFORMIN HCL 500 MG TABS: 500 | 30 days supply | Qty: 15 | Fill #0

## 2019-12-18 MED FILL — VITAMIN D3 2,000 UNIT TAB: 50 MCG | 30 days supply | Qty: 30 | Fill #0

## 2019-12-18 MED FILL — ELIQUIS 2.5 MG TABLET: 2.5 | 30 days supply | Qty: 60 | Fill #0

## 2019-12-18 MED FILL — oxyCODONE HCL 5 MG TABS: 5 | 3 days supply | Qty: 30 | Fill #0

## 2019-12-18 MED FILL — LIDOCAINE PATCH 5%: 5 | 30 days supply | Qty: 30 | Fill #0

## 2019-12-18 MED FILL — GABAPENTIN 300 MG CAPSULE: 300 | 30 days supply | Qty: 180 | Fill #0

## 2019-12-18 MED FILL — ALBUTEROL SULFATE HFA 108 (: 108 (90 BAS | 25 days supply | Qty: 18 | Fill #0

## 2019-12-18 MED FILL — VITAMIN C 500 MG TABLET: 500 | 30 days supply | Qty: 60 | Fill #0
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
## 2020-01-12 ENCOUNTER — Other Ambulatory Visit: Payer: Self-pay | Admitting: Orthopedic Surgery

## 2020-01-12 ENCOUNTER — Other Ambulatory Visit (HOSPITAL_COMMUNITY)
Admission: RE | Admit: 2020-01-12 | Discharge: 2020-01-12 | Disposition: A | Discharge status: Home / Self Care | Payer: HRSA Program | Source: Ambulatory Visit | Attending: Orthopedic Surgery | Admitting: Orthopedic Surgery

## 2020-01-12 DIAGNOSIS — Z20822 Contact with and (suspected) exposure to covid-19: Secondary | ICD-10-CM | POA: Insufficient documentation

## 2020-01-12 DIAGNOSIS — Z01812 Encounter for preprocedural laboratory examination: Secondary | ICD-10-CM | POA: Insufficient documentation

## 2020-01-12 LAB — SARS CORONAVIRUS 2 (TAT 6-24 HRS): SARS Coronavirus 2: NEGATIVE

## 2020-01-13 ENCOUNTER — Encounter (HOSPITAL_BASED_OUTPATIENT_CLINIC_OR_DEPARTMENT_OTHER): Payer: Self-pay | Admitting: Orthopedic Surgery

## 2020-01-13 ENCOUNTER — Encounter (HOSPITAL_BASED_OUTPATIENT_CLINIC_OR_DEPARTMENT_OTHER)
Admission: RE | Disposition: A | Discharge status: Home / Self Care | Payer: Self-pay | Source: Home / Self Care | Attending: Orthopedic Surgery

## 2020-01-13 ENCOUNTER — Encounter: Payer: Self-pay | Admitting: Physical Medicine & Rehabilitation

## 2020-01-13 ENCOUNTER — Other Ambulatory Visit: Payer: Self-pay

## 2020-01-13 ENCOUNTER — Ambulatory Visit (HOSPITAL_BASED_OUTPATIENT_CLINIC_OR_DEPARTMENT_OTHER): Payer: Self-pay | Admitting: Anesthesiology

## 2020-01-13 ENCOUNTER — Ambulatory Visit (HOSPITAL_BASED_OUTPATIENT_CLINIC_OR_DEPARTMENT_OTHER)
Admission: RE | Admit: 2020-01-13 | Discharge: 2020-01-13 | Disposition: A | Discharge status: Home / Self Care | Payer: Self-pay | Attending: Orthopedic Surgery | Admitting: Orthopedic Surgery

## 2020-01-13 DIAGNOSIS — Z448 Encounter for fitting and adjustment of other external prosthetic devices: Secondary | ICD-10-CM | POA: Insufficient documentation

## 2020-01-13 HISTORY — DX: Type 2 diabetes mellitus without complications: E11.9

## 2020-01-13 HISTORY — PX: HARDWARE REMOVAL: SHX979

## 2020-01-13 HISTORY — PX: EXTREMITY WIRE/PIN REMOVAL: SHX5051

## 2020-01-13 HISTORY — DX: Disorder of thyroid, unspecified: E07.9

## 2020-01-13 LAB — BASIC METABOLIC PANEL
Anion gap: 11 (ref 5–15)
BUN: 6 mg/dL (ref 6–20)
CO2: 31 mmol/L (ref 22–32)
Calcium: 10 mg/dL (ref 8.9–10.3)
Chloride: 97 mmol/L — ABNORMAL LOW (ref 98–111)
Creatinine, Ser: 0.56 mg/dL (ref 0.44–1.00)
GFR, Estimated: >60 mL/min (ref >60)
Glucose, Bld: 231 mg/dL — ABNORMAL HIGH (ref 70–99)
Potassium: 3.6 mmol/L (ref 3.5–5.1)
Sodium: 139 mmol/L (ref 135–145)

## 2020-01-13 LAB — GLUCOSE, CAPILLARY
Glucose-Capillary: 212 mg/dL — ABNORMAL HIGH (ref 70–99)
Glucose-Capillary: 183 mg/dL — ABNORMAL HIGH (ref 70–99)

## 2020-01-13 SURGERY — REMOVAL, HARDWARE
Anesthesia: General | Site: Wrist | Laterality: Right

## 2020-01-13 MED ORDER — HYDROMORPHONE HCL 1 MG/ML IJ SOLN
INTRAMUSCULAR | Status: AC
  Filled 2020-01-13: qty 0.5

## 2020-01-13 MED ORDER — PROMETHAZINE HCL 25 MG/ML IJ SOLN: 6.2500 mg | INTRAMUSCULAR | Status: DC | PRN

## 2020-01-13 MED ORDER — BUPIVACAINE HCL (PF) 0.25 % IJ SOLN
INTRAMUSCULAR | Status: DC | PRN
  Administered 2020-01-13: 7 mL

## 2020-01-13 MED ORDER — ONDANSETRON HCL 4 MG/2ML IJ SOLN
INTRAMUSCULAR | Status: DC | PRN
  Administered 2020-01-13: 4 mg via INTRAVENOUS

## 2020-01-13 MED ORDER — CEFAZOLIN SODIUM-DEXTROSE 2-4 GM/100ML-% IV SOLN
2.0000 g | INTRAVENOUS | Status: AC
  Administered 2020-01-13: 2 g via INTRAVENOUS

## 2020-01-13 MED ORDER — ONDANSETRON HCL 4 MG/2ML IJ SOLN
INTRAMUSCULAR | Status: AC
  Filled 2020-01-13: qty 2

## 2020-01-13 MED ORDER — DEXAMETHASONE SODIUM PHOSPHATE 10 MG/ML IJ SOLN
INTRAMUSCULAR | Status: DC | PRN
  Administered 2020-01-13: 4 mg via INTRAVENOUS

## 2020-01-13 MED ORDER — FENTANYL CITRATE (PF) 100 MCG/2ML IJ SOLN
INTRAMUSCULAR | Status: DC | PRN
  Administered 2020-01-13 (×2): 50 ug via INTRAVENOUS

## 2020-01-13 MED ORDER — AMISULPRIDE (ANTIEMETIC) 5 MG/2ML IV SOLN: 10.0000 mg | Freq: Once | INTRAVENOUS | Status: DC | PRN

## 2020-01-13 MED ORDER — CEFAZOLIN SODIUM-DEXTROSE 2-4 GM/100ML-% IV SOLN
INTRAVENOUS | Status: AC
  Filled 2020-01-13: qty 100

## 2020-01-13 MED ORDER — HYDROMORPHONE HCL 1 MG/ML IJ SOLN
0.2500 mg | INTRAMUSCULAR | Status: DC | PRN
  Administered 2020-01-13 (×2): 0.25 mg via INTRAVENOUS

## 2020-01-13 MED ORDER — FENTANYL CITRATE (PF) 100 MCG/2ML IJ SOLN
INTRAMUSCULAR | Status: AC
  Filled 2020-01-13: qty 2

## 2020-01-13 MED ORDER — LIDOCAINE 2% (20 MG/ML) 5 ML SYRINGE
INTRAMUSCULAR | Status: AC
  Filled 2020-01-13: qty 5

## 2020-01-13 MED ORDER — LACTATED RINGERS IV SOLN: INTRAVENOUS | Status: DC

## 2020-01-13 MED ORDER — MIDAZOLAM HCL 5 MG/5ML IJ SOLN
INTRAMUSCULAR | Status: DC | PRN
  Administered 2020-01-13 (×2): 1 mg via INTRAVENOUS

## 2020-01-13 MED ORDER — SULFAMETHOXAZOLE-TRIMETHOPRIM 800-160 MG PO TABS: 1.0000 | ORAL_TABLET | Freq: Two times a day (BID) | ORAL | 0 refills | Status: DC

## 2020-01-13 MED ORDER — MIDAZOLAM HCL 2 MG/2ML IJ SOLN
INTRAMUSCULAR | Status: AC
  Filled 2020-01-13: qty 2

## 2020-01-13 MED ORDER — OXYCODONE HCL 5 MG PO TABS
5.0000 mg | ORAL_TABLET | Freq: Once | ORAL | Status: AC | PRN
  Administered 2020-01-13: 5 mg via ORAL

## 2020-01-13 MED ORDER — PROPOFOL 10 MG/ML IV BOLUS
INTRAVENOUS | Status: DC | PRN
  Administered 2020-01-13: 200 mg via INTRAVENOUS

## 2020-01-13 MED ORDER — PROPOFOL 10 MG/ML IV BOLUS
INTRAVENOUS | Status: AC
  Filled 2020-01-13: qty 20

## 2020-01-13 MED ORDER — OXYCODONE HCL 5 MG/5ML PO SOLN: 5.0000 mg | Freq: Once | ORAL | Status: AC | PRN

## 2020-01-13 MED ORDER — LIDOCAINE 2% (20 MG/ML) 5 ML SYRINGE
INTRAMUSCULAR | Status: DC | PRN
  Administered 2020-01-13: 60 mg via INTRAVENOUS

## 2020-01-13 MED ORDER — OXYCODONE HCL 5 MG PO TABS
ORAL_TABLET | ORAL | Status: AC
  Filled 2020-01-13: qty 1

## 2020-01-13 MED ORDER — MEPERIDINE HCL 25 MG/ML IJ SOLN: 6.2500 mg | INTRAMUSCULAR | Status: DC | PRN

## 2020-01-13 MED ORDER — DEXAMETHASONE SODIUM PHOSPHATE 10 MG/ML IJ SOLN
INTRAMUSCULAR | Status: AC
  Filled 2020-01-13: qty 1

## 2020-01-13 SURGICAL SUPPLY — 45 items
BLADE MINI RND TIP GREEN BEAV (BLADE) IMPLANT
BLADE SURG 15 STRL LF DISP TIS (BLADE) ×4 IMPLANT
BLADE SURG 15 STRL SS (BLADE) ×4
BNDG COHESIVE 1X5 TAN STRL LF (GAUZE/BANDAGES/DRESSINGS) IMPLANT
BNDG ELASTIC 2X5.8 VLCR STR LF (GAUZE/BANDAGES/DRESSINGS) IMPLANT
BNDG ELASTIC 3X5.8 VLCR STR LF (GAUZE/BANDAGES/DRESSINGS) ×4 IMPLANT
BNDG ESMARK 4X9 LF (GAUZE/BANDAGES/DRESSINGS) IMPLANT
BNDG GAUZE ELAST 4 BULKY (GAUZE/BANDAGES/DRESSINGS) ×4 IMPLANT
CHLORAPREP W/TINT 26 (MISCELLANEOUS) ×8 IMPLANT
CLOSURE WOUND 1/4X4 (GAUZE/BANDAGES/DRESSINGS)
CORD BIPOLAR FORCEPS 12FT (ELECTRODE) ×4 IMPLANT
COVER BACK TABLE 60X90IN (DRAPES) ×4 IMPLANT
COVER MAYO STAND STRL (DRAPES) ×8 IMPLANT
COVER WAND RF STERILE (DRAPES) IMPLANT
CUFF TOURN SGL QUICK 18X4 (TOURNIQUET CUFF) ×8 IMPLANT
DECANTER SPIKE VIAL GLASS SM (MISCELLANEOUS) ×4 IMPLANT
DRAPE EXTREMITY T 121X128X90 (DISPOSABLE) ×8 IMPLANT
DRAPE SURG 17X23 STRL (DRAPES) ×8 IMPLANT
DRSG PAD ABDOMINAL 8X10 ST (GAUZE/BANDAGES/DRESSINGS) IMPLANT
GAUZE SPONGE 4X4 12PLY STRL (GAUZE/BANDAGES/DRESSINGS) ×4 IMPLANT
GAUZE XEROFORM 1X8 LF (GAUZE/BANDAGES/DRESSINGS) ×4 IMPLANT
GLOVE BIO SURGEON STRL SZ7.5 (GLOVE) ×4 IMPLANT
GLOVE BIOGEL PI IND STRL 8 (GLOVE) ×2 IMPLANT
GLOVE BIOGEL PI INDICATOR 8 (GLOVE) ×2
GOWN STRL REUS W/ TWL LRG LVL3 (GOWN DISPOSABLE) ×2 IMPLANT
GOWN STRL REUS W/TWL LRG LVL3 (GOWN DISPOSABLE) ×2
GOWN STRL REUS W/TWL XL LVL3 (GOWN DISPOSABLE) ×8 IMPLANT
NEEDLE HYPO 25X1 1.5 SAFETY (NEEDLE) IMPLANT
NS IRRIG 1000ML POUR BTL (IV SOLUTION) ×4 IMPLANT
PACK BASIN DAY SURGERY FS (CUSTOM PROCEDURE TRAY) ×4 IMPLANT
PAD CAST 3X4 CTTN HI CHSV (CAST SUPPLIES) IMPLANT
PADDING CAST ABS 4INX4YD NS (CAST SUPPLIES) ×2
PADDING CAST ABS COTTON 4X4 ST (CAST SUPPLIES) ×2 IMPLANT
PADDING CAST COTTON 3X4 STRL (CAST SUPPLIES)
SPLINT PLASTER CAST XFAST 3X15 (CAST SUPPLIES) IMPLANT
SPLINT PLASTER XTRA FASTSET 3X (CAST SUPPLIES)
STOCKINETTE 4X48 STRL (DRAPES) ×8 IMPLANT
STRIP CLOSURE SKIN 1/4X4 (GAUZE/BANDAGES/DRESSINGS) IMPLANT
SUT ETHILON 4 0 PS 2 18 (SUTURE) ×4 IMPLANT
SUT MNCRL AB 4-0 PS2 18 (SUTURE) IMPLANT
SUT VIC AB 3-0 FS2 27 (SUTURE) IMPLANT
SYR BULB EAR ULCER 3OZ GRN STR (SYRINGE) IMPLANT
SYR CONTROL 10ML LL (SYRINGE) ×4 IMPLANT
TOWEL GREEN STERILE FF (TOWEL DISPOSABLE) ×8 IMPLANT
UNDERPAD 30X36 HEAVY ABSORB (UNDERPADS AND DIAPERS) ×8 IMPLANT

## 2020-01-13 NOTE — Anesthesia Procedure Notes (Signed)
Procedure Name: LMA Insertion Date/Time: 01/13/2020 3:43 PM Performed by: Burna Cash, CRNA Pre-anesthesia Checklist: Patient identified, Emergency Drugs available, Suction available and Patient being monitored Patient Re-evaluated:Patient Re-evaluated prior to induction Oxygen Delivery Method: Circle system utilized Preoxygenation: Pre-oxygenation with 100% oxygen Induction Type: IV induction Ventilation: Mask ventilation without difficulty LMA: LMA inserted LMA Size: 4.0 Number of attempts: 1 Airway Equipment and Method: Bite block Placement Confirmation: positive ETCO2 Tube secured with: Tape Dental Injury: Teeth and Oropharynx as per pre-operative assessment

## 2020-01-13 NOTE — Transfer of Care (Signed)
Immediate Anesthesia Transfer of Care Note  Patient: Kristen Aguirre  Procedure(s) Performed: EXTERNAL FIXATOR RIGHT DISTAL RADIUS    (Right Wrist) REMOVAL K-WIRE/PIN EXTREMITY (Left Hand)  Patient Location: PACU  Anesthesia Type:General  Level of Consciousness: awake, alert  and oriented  Airway & Oxygen Therapy: Patient Spontanous Breathing and Patient connected to face mask oxygen  Post-op Assessment: Report given to RN and Post -op Vital signs reviewed and stable  Post vital signs: Reviewed and stable  Last Vitals:  Vitals Value Taken Time  BP 123/60 01/13/20 1625  Temp    Pulse 81 01/13/20 1629  Resp 18 01/13/20 1629  SpO2 100 % 01/13/20 1629  Vitals shown include unvalidated device data.  Last Pain:  Vitals:   01/13/20 1405  TempSrc:   PainSc: 5       Patients Stated Pain Goal: 3 (01/13/20 1405)  Complications: No complications documented.
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
## 2020-01-13 NOTE — Op Note (Signed)
NAME: Kristen Aguirre MEDICAL RECORD NO: [id number] DATE OF BIRTH: [birth year] FACILITY: [facility] LOCATION: [facility] PHYSICIAN: Tami Ribas, MD   OPERATIVE REPORT   DATE OF PROCEDURE: 01/13/20    PREOPERATIVE DIAGNOSIS:   Left small finger pin fixation, right wrist retained external fixator   POSTOPERATIVE DIAGNOSIS:  Left small finger pin fixation, right wrist retained external fixator   PROCEDURE:   1.  Removal of right wrist external fixator 2.  Removal of pins left small finger   SURGEON:  Betha Loa, M.D.   ASSISTANT: Cindee Salt, MD   ANESTHESIA:  General   INTRAVENOUS FLUIDS:  Per anesthesia flow sheet.   ESTIMATED BLOOD LOSS:  Minimal.   COMPLICATIONS:  None.   SPECIMENS:  none   TOURNIQUET TIME:   None   DISPOSITION:  Stable to PACU.   INDICATIONS: 54 year old female status post open reduction with external fixation of right comminuted intra-articular distal radius fracture with subsequent internal fixation and closed reduction pin fixation left small finger middle phalanx fracture.  She presents today for removal of the external fixator from the right wrist and and removal of pins from the left small finger. Risks, benefits and alternatives of surgery were discussed including the risks of blood loss, infection, damage to nerves, vessels, tendons, ligaments, bone for surgery, need for additional surgery, complications with wound healing, continued pain, stiffness.  She voiced understanding of these risks and elected to proceed.  OPERATIVE COURSE:  After being identified preoperatively by myself,  the patient and I agreed on the procedure and site of the procedure.  The surgical site was marked.  Surgical consent had been signed. She was given IV antibiotics as preoperative antibiotic prophylaxis. She was transferred to the operating room and placed on the operating table in supine position with the Right upper extremity on an arm board.   General anesthesia was induced by the anesthesiologist.  Surgical pause was performed between surgeons anesthesia and operating room staff and all were in agreement as to the patient procedure and site procedure.  The pins from the left small finger were removed with a needle driver after prepping the pin sites with Betadine.  A Band-Aid was placed.  The splint was replaced.  Right upper extremity was prepped and draped in normal sterile orthopedic fashion.  A surgical pause was performed between the surgeons, anesthesia, and operating room staff and all were in agreement as to the patient, procedure, and site of procedure.  Tourniquet was not inflated.    The bar of the external fixator had been removed prior to prepping and draping.  The pins in the metacarpal were loose and were able to be removed by hand.  The pins in the radius were removed with pliers.  The pin sites were then curetted.  The remaining sutures in the left arm were removed.  The pin sites were treated with Betadine.  Some dried skin was removed from the wrist and palm.  The wounds were injected with quarter percent plain Marcaine to aid in postoperative analgesia.  The wounds were dressed with sterile Xeroform and 4 x 4's and wrapped with a Kerlix bandage.  A volar splint was placed and wrapped with Kerlix and Ace bandage.  The tourniquet not been inflated.  Fingertips were pink with brisk capillary refill at the completion of the case.  The operative  drapes were broken down.  The patient was awoken from anesthesia safely.  She was transferred back to the stretcher  and taken to PACU in stable condition.  I will see her back in the office in 1 week for postoperative followup.  I will give her a prescription for Bactrim DS 1 p.o. twice daily x7 days.  She was given a prescription for hydrocodone yesterday at her office visit.   Betha Loa, MD Electronically signed, 01/13/20

## 2020-01-13 NOTE — Anesthesia Postprocedure Evaluation (Signed)
Anesthesia Post Note  Patient: Kristen Aguirre  Procedure(s) Performed: EXTERNAL FIXATOR RIGHT DISTAL RADIUS    (Right Wrist) REMOVAL K-WIRE/PIN EXTREMITY (Left Hand)     Patient location during evaluation: PACU Anesthesia Type: General Level of consciousness: awake and alert Pain management: pain level controlled Vital Signs Assessment: post-procedure vital signs reviewed and stable Respiratory status: spontaneous breathing, nonlabored ventilation and respiratory function stable Cardiovascular status: blood pressure returned to baseline and stable Postop Assessment: no apparent nausea or vomiting Anesthetic complications: no   No complications documented.  Last Vitals:  Vitals:   01/13/20 1656 01/13/20 1708  BP: 139/72 134/69  Pulse: 72 78  Resp: 12 17  Temp:  36.7 C  SpO2: 97% 94%    Last Pain:  Vitals:   01/13/20 1713  TempSrc:   PainSc: 4                  Lowella Curb

## 2020-01-13 NOTE — Op Note (Signed)
I assisted Surgeon(s) and Role:    Betha Loa, MD - Primary on the Procedure(s): EXTERNAL FIXATOR RIGHT DISTAL RADIUS    REMOVAL K-WIRE/PIN EXTREMITY on 01/13/2020.  I provided assistance on this case as follows: setup ,removal of Ex-Fix, application of dressing and splint.  Electronically signed by: Cindee Salt, MD Date: 01/13/2020 Time: 4:24 PM

## 2020-01-13 NOTE — H&P (Signed)
Kristen Aguirre is an 54 y.o. female.   Chief Complaint: retained hardware HPI: 54 yo female s/p right distal radius ex fix and ORIF and left small finger pin fixation.  Presents today for removal of external fixator and left small finger pins.  Allergies: No Known Allergies  Past Medical History:  Diagnosis Date  . Closed dislocation of left hip (HCC) 11/19/2019  . Displaced fracture of posterior wall of left acetabulum, initial encounter for closed fracture (HCC) 11/19/2019    Past Surgical History:  Procedure Laterality Date  . CLOSED REDUCTION FINGER WITH PERCUTANEOUS PINNING Left 12/02/2019   Procedure: CLOSED REDUCTION FINGER WITH PERCUTANEOUS PINNING SMALL FINGER;  Surgeon: Betha Loa, MD;  Location: [facility] OR;  Service: Orthopedics;  Laterality: Left;  . HIP CLOSED REDUCTION Left 11/15/2019   Procedure: CLOSED REDUCTION HIP;  Surgeon: Yolonda Kida, MD;  Location[facility] OR;  Service: Orthopedics;  Laterality: Left;  . I & D EXTREMITY Right 11/15/2019   Procedure: IRRIGATION AND DEBRIDEMENT; EXPLORATION POSSIBLE REPAIR OF TENDON AND NERVE;  Surgeon: Betha Loa, MD;  Location: [facility] OR;  Service: Orthopedics;  Laterality: Right;  . OPEN REDUCTION INTERNAL FIXATION (ORIF) DISTAL RADIAL FRACTURE Right 11/15/2019   Procedure: DISTAL RADIAL FRACTURE -RIGHT  EXTERNAL FIXATION; IRRIGATION AND DEBRIDEMENT LEFT SMALL FINGER WITH CLOSURE LACERATION. ;  Surgeon: Betha Loa, MD;  Location: [facility] OR;  Service: Orthopedics;  Laterality: Right;  LEFT SMALL FINGER IRRIGATION AND DEBRIDEMENT WITH CLOSURE LACERATION ADDED.  Marland Kitchen OPEN REDUCTION INTERNAL FIXATION (ORIF) DISTAL RADIAL FRACTURE Right 12/02/2019   Procedure: OPEN REDUCTION RADIUS WITH PERCUTANEOU PINNING;  Surgeon: Betha Loa, MD;  [facility];  Service: Orthopedics;  Laterality: Right;  . OPEN REDUCTION INTERNAL FIXATION ACETABULUM FRACTURE POSTERIOR Left 11/17/2019   Procedure: OPEN REDUCTION INTERNAL FIXATION ACETABULUM FRACTURE  POSTERIOR;  Surgeon: Myrene Galas, MD;  [facility];  Service: Orthopedics;  Laterality: Left;    Family History: No family history on file.  Social History:   reports that she has never smoked. She has never used smokeless tobacco. She reports current alcohol use. No history on file for drug use.  Medications: No medications prior to admission.    Results for orders placed or performed during the hospital encounter of 01/12/20 (from the past 48 hour(s))  SARS CORONAVIRUS 2 (TAT 6-24 HRS) Nasopharyngeal Nasopharyngeal Swab     Status: None   Collection Time: 01/12/20  1:33 PM   Specimen: Nasopharyngeal Swab  Result Value Ref Range   SARS Coronavirus 2 NEGATIVE NEGATIVE    Comment: (NOTE) SARS-CoV-2 target nucleic acids are NOT DETECTED.  The SARS-CoV-2 RNA is generally detectable in upper and lower respiratory specimens during the acute phase of infection. Negative results do not preclude SARS-CoV-2 infection, do not rule out co-infections with other pathogens, and should not be used as the sole basis for treatment or other patient management decisions. Negative results must be combined with clinical observations, patient history, and epidemiological information. The expected result is Negative.  Fact Sheet for Patients: HairSlick.no  Fact Sheet for Healthcare Providers: quierodirigir.com  This test is not yet approved or cleared by the [place] FDA and  has been authorized for detection and/or diagnosis of SARS-CoV-2 by FDA under an Emergency Use Authorization (EUA). This EUA will remain  in effect (meaning this test can be used) for the duration of the COVID-19 declaration under Se ction 564(b)(1) of the Act, 21 U.S.C. section 360bbb-3(b)(1), unless the authorization is terminated or revoked sooner.  Performed at  [facility], [address] [place], [place] [address]     No results  found.   A comprehensive review of systems was negative.  There were no vitals taken for this visit.  General appearance: alert, cooperative and appears stated age Head: Normocephalic, without obvious abnormality, atraumatic Neck: supple, symmetrical, trachea midline Cardio: regular rate and rhythm Resp: clear to auscultation bilaterally Extremities: Intact sensation and capillary refill all digits.  +epl/fpl/io.  Healed surgical wounds Pulses: 2+ and symmetric Skin: Skin color, texture, turgor normal. No rashes or lesions Neurologic: Grossly normal Incision/Wound: none  Assessment/Plan Right wrist ex fix and left small pins.  Plan removal left wrist ex fix and left small finger pins.  Risks, benefits, and alternatives of surgery have been discussed and the patient agrees with the plan of care.   Betha Loa 01/13/2020, 12:41 PM

## 2020-01-13 NOTE — Discharge Instructions (Addendum)

## 2020-01-16 ENCOUNTER — Encounter (HOSPITAL_BASED_OUTPATIENT_CLINIC_OR_DEPARTMENT_OTHER): Payer: Self-pay | Admitting: Orthopedic Surgery

## 2020-01-29 ENCOUNTER — Encounter: Payer: Self-pay | Admitting: Physical Medicine & Rehabilitation

## 2020-02-09 ENCOUNTER — Encounter: Payer: Self-pay | Admitting: Physical Medicine & Rehabilitation

## 2020-02-09 ENCOUNTER — Encounter: Payer: Self-pay | Attending: Physical Medicine & Rehabilitation | Admitting: Physical Medicine & Rehabilitation

## 2020-02-09 ENCOUNTER — Other Ambulatory Visit: Payer: Self-pay

## 2020-02-09 VITALS — BP 113/74 | HR 81 | Temp 98.7°F | Ht 59.0 in | Wt 176.0 lb

## 2020-02-09 DIAGNOSIS — S37019A Minor contusion of unspecified kidney, initial encounter: Secondary | ICD-10-CM

## 2020-02-09 DIAGNOSIS — S52501S Unspecified fracture of the lower end of right radius, sequela: Secondary | ICD-10-CM

## 2020-02-09 DIAGNOSIS — G8918 Other acute postprocedural pain: Secondary | ICD-10-CM

## 2020-02-09 DIAGNOSIS — S22000A Wedge compression fracture of unspecified thoracic vertebra, initial encounter for closed fracture: Secondary | ICD-10-CM

## 2020-02-09 DIAGNOSIS — T07XXXA Unspecified multiple injuries, initial encounter: Secondary | ICD-10-CM

## 2020-02-09 DIAGNOSIS — R269 Unspecified abnormalities of gait and mobility: Secondary | ICD-10-CM

## 2020-02-09 MED ORDER — METHOCARBAMOL 500 MG PO TABS: 500.0000 mg | ORAL_TABLET | Freq: Two times a day (BID) | ORAL | 1 refills | Status: DC | PRN

## 2020-02-09 MED ORDER — GABAPENTIN 300 MG PO CAPS
600.0000 mg | ORAL_CAPSULE | Freq: Three times a day (TID) | ORAL | 0 refills | Status: DC
Start: 2020-02-09 — End: 2020-07-21

## 2020-02-09 MED ORDER — METFORMIN HCL 500 MG PO TABS
250.0000 mg | ORAL_TABLET | Freq: Every day | ORAL | 0 refills | Status: DC
Start: 2020-02-09 — End: 2020-08-04
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
## 2020-04-13 ENCOUNTER — Encounter: Payer: Self-pay | Attending: Physical Medicine & Rehabilitation | Admitting: Physical Medicine & Rehabilitation

## 2020-04-13 DIAGNOSIS — T07XXXA Unspecified multiple injuries, initial encounter: Secondary | ICD-10-CM | POA: Insufficient documentation

## 2020-04-13 DIAGNOSIS — S37019A Minor contusion of unspecified kidney, initial encounter: Secondary | ICD-10-CM | POA: Insufficient documentation

## 2020-04-13 DIAGNOSIS — S52501S Unspecified fracture of the lower end of right radius, sequela: Secondary | ICD-10-CM | POA: Insufficient documentation

## 2020-04-13 DIAGNOSIS — S22000A Wedge compression fracture of unspecified thoracic vertebra, initial encounter for closed fracture: Secondary | ICD-10-CM | POA: Insufficient documentation

## 2020-04-13 DIAGNOSIS — G8918 Other acute postprocedural pain: Secondary | ICD-10-CM | POA: Insufficient documentation

## 2020-04-13 DIAGNOSIS — R269 Unspecified abnormalities of gait and mobility: Secondary | ICD-10-CM | POA: Insufficient documentation

## 2020-07-21 ENCOUNTER — Encounter: Payer: Self-pay | Admitting: Gerontology

## 2020-07-21 ENCOUNTER — Other Ambulatory Visit: Payer: Self-pay

## 2020-07-21 ENCOUNTER — Ambulatory Visit: Payer: Self-pay | Admitting: Gerontology

## 2020-07-21 VITALS — BP 134/83 | HR 71 | Temp 98.1°F | Resp 16 | Ht 59.5 in | Wt 172.8 lb

## 2020-07-21 DIAGNOSIS — R222 Localized swelling, mass and lump, trunk: Secondary | ICD-10-CM | POA: Insufficient documentation

## 2020-07-21 DIAGNOSIS — Z8639 Personal history of other endocrine, nutritional and metabolic disease: Secondary | ICD-10-CM

## 2020-07-21 DIAGNOSIS — Z7689 Persons encountering health services in other specified circumstances: Secondary | ICD-10-CM

## 2020-07-21 DIAGNOSIS — E119 Type 2 diabetes mellitus without complications: Secondary | ICD-10-CM

## 2020-07-21 LAB — POCT GLYCOSYLATED HEMOGLOBIN (HGB A1C)
HbA1c POC (<> result, manual entry): 12.6 % (ref 4.0–5.6)
HbA1c, POC (controlled diabetic range): 12.6 % — AB (ref 0.0–7.0)
HbA1c, POC (prediabetic range): 12.6 % — AB (ref 5.7–6.4)
Hemoglobin A1C: 12.6 % — AB (ref 4.0–5.6)

## 2020-07-21 LAB — GLUCOSE, POCT (MANUAL RESULT ENTRY): POC Glucose: 292 mg/dl — AB (ref 70–99)

## 2020-07-21 MED ORDER — RIGHTEST GS550 BLOOD GLUCOSE VI STRP
ORAL_STRIP | 0 refills | Status: AC
  Filled 2020-07-21: qty 100, 25d supply, fill #0

## 2020-07-21 MED ORDER — RIGHTEST GL300 LANCETS MISC
0 refills | Status: AC
  Filled 2020-07-21: qty 100, 25d supply, fill #0

## 2020-07-21 MED ORDER — BLOOD GLUCOSE MONITOR SYSTEM W/DEVICE KIT
PACK | 0 refills | Status: AC
  Filled 2020-07-21: qty 1, 30d supply, fill #0

## 2020-07-21 MED ORDER — BASAGLAR KWIKPEN 100 UNIT/ML ~~LOC~~ SOPN
10.0000 [IU] | PEN_INJECTOR | Freq: Every day | SUBCUTANEOUS | 3 refills | Status: DC
  Filled 2020-07-21: qty 15, 140d supply, fill #0

## 2020-07-21 NOTE — Patient Instructions (Signed)
https://www.diabeteseducator.org/docs/default-source/living-with-diabetes/conquering-the-grocery-store-v1.pdf?sfvrsn=4">  Recuento de carbohidratos para la diabetes mellitus en los adultos Carbohydrate Counting for Diabetes Mellitus, Adult El recuento de carbohidratos es un mtodo para llevar un registro de la cantidad de carbohidratos que se ingieren. La ingesta natural de carbohidratos aumenta la cantidad de azcar (glucosa) en la sangre. El recuento de la cantidad de carbohidratos que se ingieren mejora el control del nivel de [place], lo que ayuda a Company secretary la diabetes. Es importante saber la cantidad de carbohidratos que se pueden ingerir en cada comida sin correr Surveyor, minerals. Esto es Government social research officer. Un nutricionista puede ayudarlo a crear un plan de alimentacin y a calcular la cantidad de carbohidratos que debe ingerir en cada comida y colacin. Qu alimentos contienen carbohidratos? Los siguientes alimentos incluyen carbohidratos:  Granos, como panes y cereales.  Frijoles secos y productos con soja.  Verduras con almidn, como papas, guisantes y maz.  Nils Pyle y jugos de frutas.  Leche y Dentist.  Dulces y colaciones, como pasteles, galletas, caramelos, papas fritas de bolsa y refrescos.   Cmo se calculan los carbohidratos de los alimentos? Hay dos maneras de calcular los carbohidratos de los alimentos. Puede leer las etiquetas de los alimentos o aprender cules son los tamaos de las porciones estndar de los alimentos. Puede usar cualquiera de [address] o [place] combinacin de [place]. Usar la Air cabin crew de informacin nutricional La lista de informacin nutricional est incluida en las etiquetas de casi todas las bebidas y los alimentos envasados de los [place]. Incluye lo siguiente:  El tamao de la porcin.  Informacin sobre los nutrientes de cada porcin, incluidos los gramos (g) de carbohidratos por porcin. Para usar la informacin  nutricional:  Decida cuntas porciones va a comer.  Multiplique la cantidad de porciones por el nmero de carbohidratos por porcin.  El resultado es la cantidad total de carbohidratos que comer. Conocer los tamaos de las porciones estndar de los alimentos Cuando coma alimentos que contengan carbohidratos y que no estn envasados o no incluyan la informacin nutricional en la etiqueta, debe medir las porciones para poder calcular la cantidad de carbohidratos.  Mida los alimentos que comer con una balanza de alimentos o una taza medidora, si es necesario.  Decida cuntas porciones de Programmer, systems.  Multiplique el nmero de porciones por15. En los alimentos que contienen carbohidratos, una porcin [place] a 15 g de carbohidratos. ? Por ejemplo, si come 2 tazas o 10onzas (300g) de fresas, habr comido 2porciones y 30g de carbohidratos (2porciones x 15g=30g).  En el caso de las comidas que contienen mezclas de ms de un alimento, como las sopas y los guisos, debe calcular los carbohidratos de cada alimento que se incluye. La siguiente lista contiene los tamaos de porciones estndar de los alimentos ricos en carbohidratos ms comunes. Cada una de estas porciones tiene aproximadamente 15g de carbohidratos:  1rebanada de pan.  1tortilla de seis pulgadas (15cm).  ? de taza o 2onzas (53g) de arroz o pasta cocidos.   taza o 3 onzas (85 g) de lentejas o frijoles cocidos o enlatados, escurridos y enjuagados.   taza o 3onzas (85g) de verduras con almidn, como guisantes, maz o zapallo.   taza o 4 onzas (120 g) de cereal caliente.   taza o 3 onzas (85 g) de papas hervidas o en pur, o  o 3 onzas (85 g) de una papa grande al horno.   taza o 4 onzas fluidas ( ) de jugo de frutas.  1 taza u 8  onzas fluidas (237 ml) de leche.  1 unidad pequea o 4 onzas (106 g) de manzana.   unidad o 2 onzas (63 g) de una banana mediana.  1 taza o 5 oz (150 g) de  fresas.  3 tazas o 1 oz (24g) de palomitas de maz. Cul sera un ejemplo de recuento de carbohidratos? Para calcular el nmero de carbohidratos de este ejemplo de comida, siga los pasos que se describen a continuacin. Ejemplo de comida  3 onzas (85g) de pechugas de pollo.  ? de taza o 4 onzas (106 g) de arroz integral.   taza o 3 onzas (85 g) de maz.  1 taza u 8 onzas fluidas (237 ml) de leche.  1 taza o 5onzas (150g) de fresas con crema batida sin azcar. Clculo de carbohidratos 1. Identifique los alimentos que contienen carbohidratos: ? Arroz. ? Maz. ? Leche. ? Jinny Sanders. 2. Calcule cuntas porciones come de cada alimento: ? 2 porciones de arroz. ? 1 porcin de maz. ? 1 porcin de leche. ? 1 porcin de fresas. 3. Multiplique cada nmero de porciones por 15g: ? 2 porciones de arroz x 15 g = 30 g. ? 1 porcin de maz x 15 g = 15 g. ? 1 porcin de leche x 15 g = 15 g. ? 1 porcin de fresas x 15 g = 15 g. 4. Sume todas las cantidades para conocer el total de gramos de carbohidratos consumidos: ? 30g + 15g + 15g + 15g = 75g de carbohidratos en total. Consejos para seguir este plan Al ir de compras  Elabore un plan de comidas y luego haga una lista de compras.  Compre verduras frescas y congeladas, frutas frescas y congeladas, productos lcteos, huevos, frijoles, lentejas y cereales integrales.  Fjese en las etiquetas de los alimentos. Elija los alimentos que tengan ms fibra y Neurosurgeon.  Evite los alimentos procesados y los alimentos con Biochemist, clinical. Planificacin de las comidas  Trate de consumir la misma cantidad de carbohidratos en cada comida y en cada colacin.  Planifique tomar comidas y colaciones regulares y equilibradas. Dnde buscar ms informacin  American Diabetes Association (Asociacin [place]ad[place]ense de la Diabetes): www.diabetes.org  Centers for Disease Control and Prevention (Centros para el Control y la Prevencin de  Event organiser): FootballExhibition.com.br Resumen  El recuento de carbohidratos es un mtodo para llevar un registro de la cantidad de carbohidratos que se ingieren.  La ingesta natural de carbohidratos aumenta la cantidad de azcar (glucosa) en la sangre.  El recuento de la cantidad de carbohidratos que se ingieren mejora el control del nivel de [place], lo que ayuda a Company secretary la diabetes.  Un nutricionista puede ayudarlo a crear un plan de alimentacin y a calcular la cantidad de carbohidratos que debe ingerir en cada comida y colacin. Esta informacin no tiene Theme park manager el consejo del mdico. Asegrese de hacerle al mdico cualquier pregunta que tenga. Document Revised: 03/17/2019 Document Reviewed: 03/17/2019 Elsevier Patient Education  2021 [employer].
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
## 2020-07-22 ENCOUNTER — Other Ambulatory Visit: Payer: Self-pay | Admitting: Gerontology

## 2020-07-22 ENCOUNTER — Other Ambulatory Visit: Payer: Self-pay

## 2020-07-22 DIAGNOSIS — E119 Type 2 diabetes mellitus without complications: Secondary | ICD-10-CM

## 2020-07-22 MED ORDER — "PEN NEEDLES 1/2"" 29G X 12MM MISC"
1.0000 "pen " | Freq: Every day | 0 refills | Status: DC
  Filled 2020-07-22: qty 30, fill #0

## 2020-07-23 LAB — CBC WITH DIFFERENTIAL/PLATELET
Basophils Absolute: 0 10*3/uL (ref 0.0–0.2)
Basos: 1 %
EOS (ABSOLUTE): 0.1 10*3/uL (ref 0.0–0.4)
Eos: 2 %
Hematocrit: 48.5 % — ABNORMAL HIGH (ref 34.0–46.6)
Hemoglobin: 16.2 g/dL — ABNORMAL HIGH (ref 11.1–15.9)
Immature Grans (Abs): 0 10*3/uL (ref 0.0–0.1)
Immature Granulocytes: 0 %
Lymphocytes Absolute: 1.4 10*3/uL (ref 0.7–3.1)
Lymphs: 26 %
MCH: 32.1 pg (ref 26.6–33.0)
MCHC: 33.4 g/dL (ref 31.5–35.7)
MCV: 96 fL (ref 79–97)
Monocytes Absolute: 0.2 10*3/uL (ref 0.1–0.9)
Monocytes: 4 %
Neutrophils Absolute: 3.6 10*3/uL (ref 1.4–7.0)
Neutrophils: 67 %
Platelets: 176 10*3/uL (ref 150–450)
RBC: 5.05 x10E6/uL (ref 3.77–5.28)
RDW: 13 % (ref 11.7–15.4)
WBC: 5.3 10*3/uL (ref 3.4–10.8)

## 2020-07-23 LAB — COMPREHENSIVE METABOLIC PANEL
ALT: 28 IU/L (ref 0–32)
AST: 27 IU/L (ref 0–40)
Albumin/Globulin Ratio: 1.3 (ref 1.2–2.2)
Albumin: 4.5 g/dL (ref 3.8–4.9)
Alkaline Phosphatase: 133 IU/L — ABNORMAL HIGH (ref 44–121)
BUN/Creatinine Ratio: 14 (ref 9–23)
BUN: 10 mg/dL (ref 6–24)
Bilirubin Total: 0.6 mg/dL (ref 0.0–1.2)
CO2: 25 mmol/L (ref 20–29)
Calcium: 9.1 mg/dL (ref 8.7–10.2)
Chloride: 97 mmol/L (ref 96–106)
Creatinine, Ser: 0.7 mg/dL (ref 0.57–1.00)
Globulin, Total: 3.4 g/dL (ref 1.5–4.5)
Glucose: 327 mg/dL — ABNORMAL HIGH (ref 65–99)
Potassium: 4.2 mmol/L (ref 3.5–5.2)
Sodium: 136 mmol/L (ref 134–144)
Total Protein: 7.9 g/dL (ref 6.0–8.5)
eGFR: 103 mL/min/{1.73_m2} (ref >59)

## 2020-07-23 LAB — LIPID PANEL
Chol/HDL Ratio: 5 ratio — ABNORMAL HIGH (ref 0.0–4.4)
Cholesterol, Total: 341 mg/dL — ABNORMAL HIGH (ref 100–199)
HDL: 68 mg/dL (ref >39)
LDL Chol Calc (NIH): 229 mg/dL — ABNORMAL HIGH (ref 0–99)
Triglycerides: 223 mg/dL — ABNORMAL HIGH (ref 0–149)
VLDL Cholesterol Cal: 44 mg/dL — ABNORMAL HIGH (ref 5–40)

## 2020-07-23 LAB — VITAMIN D 25 HYDROXY (VIT D DEFICIENCY, FRACTURES): Vit D, 25-Hydroxy: 13.5 ng/mL — ABNORMAL LOW (ref 30.0–100.0)

## 2020-07-23 LAB — URINALYSIS
Bilirubin, UA: NEGATIVE
Ketones, UA: NEGATIVE
Leukocytes,UA: NEGATIVE
Nitrite, UA: NEGATIVE
RBC, UA: NEGATIVE
Specific Gravity, UA: >=1.03 — AB (ref 1.005–1.030)
Urobilinogen, Ur: 0.2 mg/dL (ref 0.2–1.0)
pH, UA: 5.5 (ref 5.0–7.5)

## 2020-07-23 LAB — TSH: TSH: 59.8 u[IU]/mL — ABNORMAL HIGH (ref 0.450–4.500)

## 2020-07-23 LAB — MICROALBUMIN / CREATININE URINE RATIO
Creatinine, Urine: 129.6 mg/dL
Microalb/Creat Ratio: 47 mg/g creat — ABNORMAL HIGH (ref 0–29)
Microalbumin, Urine: 61.3 ug/mL

## 2020-08-04 ENCOUNTER — Other Ambulatory Visit: Payer: Self-pay

## 2020-08-04 ENCOUNTER — Encounter: Payer: Self-pay | Admitting: Gerontology

## 2020-08-04 ENCOUNTER — Ambulatory Visit: Payer: Self-pay | Admitting: Gerontology

## 2020-08-04 VITALS — BP 118/81 | HR 75 | Temp 97.3°F | Resp 16 | Ht 59.5 in | Wt 174.2 lb

## 2020-08-04 DIAGNOSIS — E119 Type 2 diabetes mellitus without complications: Secondary | ICD-10-CM

## 2020-08-04 DIAGNOSIS — Z8639 Personal history of other endocrine, nutritional and metabolic disease: Secondary | ICD-10-CM | POA: Insufficient documentation

## 2020-08-04 DIAGNOSIS — E785 Hyperlipidemia, unspecified: Secondary | ICD-10-CM

## 2020-08-04 DIAGNOSIS — Z794 Long term (current) use of insulin: Secondary | ICD-10-CM

## 2020-08-04 DIAGNOSIS — E559 Vitamin D deficiency, unspecified: Secondary | ICD-10-CM

## 2020-08-04 DIAGNOSIS — R222 Localized swelling, mass and lump, trunk: Secondary | ICD-10-CM

## 2020-08-04 DIAGNOSIS — E1165 Type 2 diabetes mellitus with hyperglycemia: Secondary | ICD-10-CM

## 2020-08-04 MED ORDER — VITAMIN D (ERGOCALCIFEROL) 1.25 MG (50000 UNIT) PO CAPS
50000.0000 [IU] | ORAL_CAPSULE | ORAL | 0 refills | Status: DC
  Filled 2020-08-04: qty 4, 28d supply, fill #0

## 2020-08-04 MED ORDER — ATORVASTATIN CALCIUM 20 MG PO TABS
20.0000 mg | ORAL_TABLET | Freq: Every day | ORAL | 0 refills | Status: DC
  Filled 2020-08-04: qty 30, 30d supply, fill #0

## 2020-08-04 MED ORDER — BASAGLAR KWIKPEN 100 UNIT/ML ~~LOC~~ SOPN
14.0000 [IU] | PEN_INJECTOR | Freq: Every day | SUBCUTANEOUS | 3 refills | Status: DC
  Filled 2020-08-04: qty 15, 107d supply, fill #0

## 2020-08-04 MED ORDER — INSULIN PEN NEEDLE 32G X 4 MM MISC
1.0000 "pen " | Freq: Every day | 3 refills | Status: DC
  Filled 2020-08-04: qty 100, 100d supply, fill #0

## 2020-08-04 MED ORDER — LEVOTHYROXINE SODIUM 50 MCG PO TABS
ORAL_TABLET | Freq: Every day | ORAL | 0 refills | Status: DC
  Filled 2020-08-04: qty 30, 30d supply, fill #0

## 2020-08-04 MED ORDER — METFORMIN HCL 500 MG PO TABS
500.0000 mg | ORAL_TABLET | Freq: Every day | ORAL | 0 refills | Status: DC
  Filled 2020-08-04: qty 30, 30d supply, fill #0

## 2020-08-04 NOTE — Progress Notes (Signed)
Established Patient Office Visit  Subjective:  Patient ID: Kristen Aguirre, female    DOB: Sep 15, 1965  Age: 55 y.o. MRN: [id number]  CC:  Chief Complaint  Patient presents with   Follow-up    Lab done 07/21/20   Diabetes    HPI Kristen Aguirre is a 55 y/o female who has history of T2DM, Hypothyroidism, Hyperlipidemia, presents for lab review and medication refill. Her HgbA1c done on 07/21/20 was 12.6%. She states that she's compliant with her medications and continues to adhere to ADA diet and exercise as tolerated. She checks blood glucose bid, fasting  today was 150 mg/dl/. He blood glucose during visit was 143 mg/dl. She brought her blood glucose log and her fasting readings ranges between  151 mg/dl- 252 mg/dl and her evening reading ranges between 143 mg/dl to 273 mg/dl. She admits to experiencing hyperglycemic symptoms, denies peripheral neuropathy and performs daily foot checks. Her TSH was 59.800, admits to experiencing cold/heat intolerance, denies constipation. Her Lipid panel, total cholesterol was 341 mg/dl, Triglycerides 223 mg/dl, LDL was 229 mg/dl. Her Vitamin D was 13.5 ng/ml. She has appointment with General Surgeon Dr Hampton Abbot on 08/06/20. Overall, she states that she's doing well and offers no further complaint.  Past Medical History:  Diagnosis Date   Closed dislocation of left hip ([place]) 11/19/2019   Diabetes mellitus without complication ([place])    Displaced fracture of posterior wall of left acetabulum, initial encounter for closed fracture ([place]) 11/19/2019   Thyroid disease     Past Surgical History:  Procedure Laterality Date   CLOSED REDUCTION FINGER WITH PERCUTANEOUS PINNING Left 12/02/2019   Procedure: CLOSED REDUCTION FINGER WITH PERCUTANEOUS PINNING SMALL FINGER;  Surgeon: Leanora Cover, MD;  Location: [place];  Service: Orthopedics;  Laterality: Left;   EXTREMITY WIRE/PIN REMOVAL Left 01/13/2020   Procedure: REMOVAL K-WIRE/PIN EXTREMITY;  Surgeon:  Leanora Cover, MD;  Location: [place];  Service: Orthopedics;  Laterality: Left;   FRACTURE SURGERY     HARDWARE REMOVAL Right 01/13/2020   Procedure: EXTERNAL FIXATOR RIGHT DISTAL RADIUS   ;  Surgeon: Leanora Cover, MD;  Location: [place];  Service: Orthopedics;  Laterality: Right;   HIP CLOSED REDUCTION Left 11/15/2019   Procedure: CLOSED REDUCTION HIP;  Surgeon: Nicholes Stairs, MD;  Location: [place];  Service: Orthopedics;  Laterality: Left;   I & D EXTREMITY Right 11/15/2019   Procedure: IRRIGATION AND DEBRIDEMENT; EXPLORATION POSSIBLE REPAIR OF TENDON AND NERVE;  Surgeon: Leanora Cover, MD;  Location: [place];  Service: Orthopedics;  Laterality: Right;   OPEN REDUCTION INTERNAL FIXATION (ORIF) DISTAL RADIAL FRACTURE Right 11/15/2019   Procedure: DISTAL RADIAL FRACTURE -RIGHT  EXTERNAL FIXATION; IRRIGATION AND DEBRIDEMENT LEFT SMALL FINGER WITH CLOSURE LACERATION. ;  Surgeon: Leanora Cover, MD;  Location: [place];  Service: Orthopedics;  Laterality: Right;  LEFT SMALL FINGER IRRIGATION AND DEBRIDEMENT WITH CLOSURE LACERATION ADDED.   OPEN REDUCTION INTERNAL FIXATION (ORIF) DISTAL RADIAL FRACTURE Right 12/02/2019   Procedure: OPEN REDUCTION RADIUS WITH PERCUTANEOU PINNING;  Surgeon: Leanora Cover, MD;  Location: [place];  Service: Orthopedics;  Laterality: Right;   OPEN REDUCTION INTERNAL FIXATION ACETABULUM FRACTURE POSTERIOR Left 11/17/2019   Procedure: OPEN REDUCTION INTERNAL FIXATION ACETABULUM FRACTURE POSTERIOR;  Surgeon: Altamese Chewey, MD;  Location: [place];  Service: Orthopedics;  Laterality: Left;    Family History  Problem Relation Age of Onset   Kidney disease Mother    Diabetes Mother    Other Father  unknown medical history   Kidney disease Sister    Diabetes Sister    Diabetes Maternal Aunt    Diabetes Maternal Grandfather     Social History   Socioeconomic History   Marital status: Single    Spouse name: Not on file   Number of  children: Not on file   Years of education: Not on file   Highest education level: Not on file  Occupational History   Not on file  Tobacco Use   Smoking status: Never   Smokeless tobacco: Never  Vaping Use   Vaping Use: Never used  Substance and Sexual Activity   Alcohol use: Yes    Comment: social - once per month   Drug use: Never   Sexual activity: Not on file  Other Topics Concern   Not on file  Social History Narrative   Not on file   Social Determinants of Health   Financial Resource Strain: Not on file  Food Insecurity: No Food Insecurity   Worried About Running Out of Food in the Last Year: Never true   [place] in the Last Year: Never true  Transportation Needs: No Transportation Needs   Lack of Transportation (Medical): No   Lack of Transportation (Non-Medical): No  Physical Activity: Not on file  Stress: Not on file  Social Connections: Not on file  Intimate Partner Violence: Not on file    Outpatient Medications Prior to Visit  Medication Sig Dispense Refill   Blood Glucose Monitoring Suppl (BLOOD GLUCOSE MONITOR SYSTEM) w/Device KIT Use up to 4 times daily as directed 1 kit 0   glucose blood (RIGHTEST GS550 BLOOD GLUCOSE) test strip USED AS DIRECTED UP TO 4 TIMES DAILY. 100 each 0   Rightest GL300 Lancets MISC USE AS DIRECTED UP TO 4 TIMES DAILY. 100 each 0   Insulin Glargine (BASAGLAR KWIKPEN) 100 UNIT/ML Inyecte 10 unidades en la piel a la hora de [place]. 15 mL 3   Insulin Pen Needle (PEN NEEDLES 29GX1/2") 29G X 12MM MISC Usar como se indica una vez al da. 100 each 0   albuterol (VENTOLIN HFA) 108 (90 Base) MCG/ACT inhaler INHALE 2 PUFFS INTO THE LUNGS ONCE AS NEEDED FOR WHEEZING OR SHORTNESS OF BREATH. (Patient not taking: Reported on 08/04/2020) 18 g 0   levothyroxine (SYNTHROID) 50 MCG tablet TAKE 1 TABLET (50 MCG TOTAL) BY MOUTH DAILY BEFORE BREAKFAST. (Patient not taking: Reported on 08/04/2020) 30 tablet 1   metFORMIN (GLUCOPHAGE) 500 MG  tablet Take 0.5 tablets (250 mg total) by mouth daily with breakfast. (Patient not taking: Reported on 08/04/2020) 30 tablet 0   No facility-administered medications prior to visit.    No Known Allergies  ROS Review of Systems  Constitutional: Negative.   Eyes: Negative.   Respiratory: Negative.    Endocrine: Positive for cold intolerance, heat intolerance, polydipsia, polyphagia and polyuria.  Skin: Negative.   Neurological: Negative.   Psychiatric/Behavioral: Negative.       Objective:    Physical Exam HENT:     Mouth/Throat:     Mouth: Mucous membranes are moist.  Eyes:     Extraocular Movements: Extraocular movements intact.     Conjunctiva/sclera: Conjunctivae normal.     Pupils: Pupils are equal, round, and reactive to light.  Cardiovascular:     Rate and Rhythm: Normal rate and regular rhythm.     Pulses: Normal pulses.     Heart sounds: Normal heart sounds.  Pulmonary:     Effort:  Pulmonary effort is normal.     Breath sounds: Normal breath sounds.  Skin:    General: Skin is warm.  Neurological:     General: No focal deficit present.     Mental Status: She is alert and oriented to person, place, and time. Mental status is at baseline.  Psychiatric:        Mood and Affect: Mood normal.        Behavior: Behavior normal.        Thought Content: Thought content normal.        Judgment: Judgment normal.    BP 118/81 (BP Location: Right Arm, Patient Position: Sitting, Cuff Size: Large)   Pulse 75   Temp (!) 97.3 F (36.3 C)   Resp 16   Ht 4' 11.5" (1.511 m)   Wt 174 lb 3.2 oz (79 kg)   LMP 07/21/1997 (Approximate)   SpO2 96%   BMI 34.60 kg/m  Wt Readings from Last 3 Encounters:  08/04/20 174 lb 3.2 oz (79 kg)  07/21/20 172 lb 12.8 oz (78.4 kg)  02/09/20 176 lb (79.8 kg)   Encouraged weight loss  Health Maintenance Due  Topic Date Due   PNEUMOCOCCAL POLYSACCHARIDE VACCINE AGE 48-64 HIGH RISK  Never done   COVID-19 Vaccine (1) Never done   FOOT EXAM   Never done   OPHTHALMOLOGY EXAM  Never done   Hepatitis C Screening  Never done   PAP SMEAR-Modifier  Never done   COLONOSCOPY (Pts 45-9yr Insurance coverage will need to be confirmed)  Never done   Zoster Vaccines- Shingrix (1 of 2) Never done   MAMMOGRAM  05/05/2020    There are no preventive care reminders to display for this patient.  Lab Results  Component Value Date   TSH 59.800 (H) 07/21/2020   Lab Results  Component Value Date   WBC 5.3 07/21/2020   HGB 16.2 (H) 07/21/2020   HCT 48.5 (H) 07/21/2020   MCV 96 07/21/2020   PLT 176 07/21/2020   Lab Results  Component Value Date   NA 136 07/21/2020   K 4.2 07/21/2020   CO2 25 07/21/2020   GLUCOSE 327 (H) 07/21/2020   BUN 10 07/21/2020   CREATININE 0.70 07/21/2020   BILITOT 0.6 07/21/2020   ALKPHOS 133 (H) 07/21/2020   AST 27 07/21/2020   ALT 28 07/21/2020   PROT 7.9 07/21/2020   ALBUMIN 4.5 07/21/2020   CALCIUM 9.1 07/21/2020   ANIONGAP 11 01/13/2020   EGFR 103 07/21/2020   Lab Results  Component Value Date   CHOL 341 (H) 07/21/2020   Lab Results  Component Value Date   HDL 68 07/21/2020   Lab Results  Component Value Date   LDLCALC 229 (H) 07/21/2020   Lab Results  Component Value Date   TRIG 223 (H) 07/21/2020   Lab Results  Component Value Date   CHOLHDL 5.0 (H) 07/21/2020   Lab Results  Component Value Date   HGBA1C 12.6 (A) 07/21/2020   HGBA1C 12.6 07/21/2020   HGBA1C 12.6 (A) 07/21/2020   HGBA1C 12.6 (A) 07/21/2020      Assessment & Plan:    1. Type 2 diabetes mellitus with hyperglycemia, with long-term current use of insulin ([facility]) -Her HgbA1c was 12.6%, Her Basaglar insulin was increased to 14 units at bedtime, started on Metformin 500 mg daily. She was educated on medication side effects and advised to notify clinic. She is to continue on low carb/ non concentrated sweet diet and exercise as  tolerated - Ambulatory referral to Ophthalmology - metFORMIN (GLUCOPHAGE) 500 MG tablet;  Take 1 tablet (500 mg total) by mouth daily with breakfast.  Dispense: 30 tablet; Refill: 0 - Insulin Pen Needle 32G X 4 MM MISC; Use as directed  Dispense: 100 each; Refill: 3 - Insulin Glargine (BASAGLAR KWIKPEN) 100 UNIT/ML; Inject 14 Units into the skin at bedtime.  Dispense: 15 mL; Refill: 3  2. Abdominal wall lump -She was encouraged to follow up with Dr Olean Ree  3 History of hypothyroidism -She was started on 50 mcg Levothyroxine, educated on medication side effects and advised to notify clinic - levothyroxine (SYNTHROID) 50 MCG tablet; TAKE 1 TABLET (50 MCG TOTAL) BY MOUTH DAILY BEFORE BREAKFAST.  Dispense: 30 tablet; Refill: 0  4. Elevated lipids - She will continue on Lipitor 20 mg daily, educated on medication side effects and advised to notify clinic.  - atorvastatin (LIPITOR) 20 MG tablet; Take 1 tablet (20 mg total) by mouth daily.  Dispense: 30 tablet; Refill: 0  5. Vitamin D deficiency -She will continue on - Vitamin D, Ergocalciferol, (DRISDOL) 1.25 MG (50000 UNIT) CAPS capsule; Take 1 capsule (50,000 Units total) by mouth every 7 (seven) days.  Dispense: 12 capsule; Refill: 0     Follow-up: Return in about 4 weeks (around 09/01/2020), or if symptoms worsen or fail to improve.    Hikeem Andersson Jerold Coombe, NP

## 2020-08-04 NOTE — Patient Instructions (Signed)
Plan de alimentacin cardiosaludable Heart-Healthy Eating Plan Muchos factores influyen en la salud del corazn (coronaria), entre ellos, los hbitos de alimentacin y de ejercicio fsico. El riesgo coronario aumenta cuando hay niveles anormales de grasa (lpidos) en la [place]. La planificacin de las comidas cardiosaludables implica limitar las grasas poco saludables, aumentar las grasas saludables y Air traffic controller cambios en la dieta y el estilo de [place]. En qu consiste el plan? El mdico podra recomendarle que haga lo siguiente: Limitar la ingesta de grasas al _________ % o menos del total de caloras por da. Limitar la ingesta de grasas saturadas al _________ % o menos del total de caloras por da. Limitar la cantidad de colesterol en su dieta a menos de _________ mg por da. Cules son algunos consejos para seguir este plan? Al cocinar Evite frer los alimentos a la hora de la coccin. Hornear, hervir, grillar y asar a la parrilla son buenas opciones. Otras formas de reducir el consumo de grasas son las siguientes: Quite la piel de las aves. Quite todas las grasas visibles de las carnes. Cocine al vapor las verduras en agua o caldo. Planificacin de las comidas  En las comidas, imagine dividir su plato en cuartos: Llene la mitad del plato con verduras y ensaladas de hojas verdes. Llene un cuarto del plato con cereales integrales. Llene un cuarto del plato con alimentos con protenas magras. Coma 4 o 5 porciones de verduras por da. Una porcin equivale a una taza de verduras crudas o cocidas o a 2 tazas de verduras de hojas verdes crudas. Coma 4 o 5 porciones de frutas por da. Una porcin equivale a una fruta mediana entera,  taza de fruta desecada;  taza de frutas frescas, congeladas o enlatadas; o  taza de jugo 100 % de fruta. Consuma ms alimentos con fibra soluble. Entre ellos, se incluyen las [place], el [place], las [place], los frijoles, los guisantes y Aeronautical engineer. Trate de  consumir de 25 a 30 g de [employer]. Aumente el consumo de legumbres, frutos secos y semillas a 4 o 5 porciones por semana. Una porcin de frijoles o legumbres secos equivale a  taza despus de su coccin, una porcin de frutos secos equivale a  de taza y [place] porcin de semillas equivale a 1 cucharada.  Grasas Elija grasas saludables con mayor frecuencia. Elija las grasas monoinsaturadas y [address], como el aceite de oliva y el de canola, las semillas de [place], las nueces, las almendras y las semillas. Consuma ms grasas omega-3. Elija salmn, caballa, sardinas, atn, aceite de lino y semillas de lino molidas. Propngase comer pescado al [employer] veces por semana. Lea las etiquetas de los alimentos detenidamente para identificar los que contienen grasas trans o altas cantidades de grasas saturadas. Limite el consumo de grasas saturadas. Estas se encuentran en productos de origen animal, como carnes, mantequilla y crema. Las grasas saturadas de origen vegetal incluyen aceite de palma, de palmiste y de coco. Evite los alimentos con aceites parcialmente hidrogenados. Estos contienen grasas trans. [address] [place], se incluyen margarinas en barra, algunas margarinas untables, galletas dulces y [place] y otros productos horneados. Evite las comidas fritas. Informacin general Consuma ms comida casera y menos de restaurante, de bares y comida rpida. Limite o evite el alcohol. Limite los alimentos con alto contenido de almidn y International aid/development worker. Baje de peso si es necesario. Perder solo del 5 al 10 % de su peso corporal puede ayudarlo a mejorar su estado de salud general y a Education officer, museum,  como la diabetes y las enfermedades cardacas. Controle la ingesta de sal (sodio), especialmente si tiene presin arterial alta. Hable con el mdico acerca de cambiar la ingesta de sodio. Intente incorporar ms comidas vegetarianas cada semana. Qu alimentos puedo comer? Nils Pyle Nils Pyle frescas, en  conserva (en su jugo natural) o frutas congeladas. Verduras Verduras frescas o congeladas (crudas, al vapor, asadas o grilladas). Ensaladasde hojas verdes. Cereales La mayora de los cereales. Elija casi siempre trigo integral y Radiation protection practitioner. Arroz y pastas, incluido el arroz integral y las pastas elaboradascon trigo integral. Armed forces operational officer y otras protenas [place] magras de res, ternera, cerdo y cordero a las que se les haya quitado la grasa visible. Pollo y pavo sin piel. Todos los pescados y [employer]. Pato salvaje, conejo, faisn y venado. Claras de huevo o sustitutos del huevo bajos en colesterol. Porotos, guisantes y lentejas secos y tofu. Semillas y Financial controller de los frutos secos. Lcteos Quesos descremados y semidescremados, entre ellos, ricota y Garment/textile technologist. Leche descremada o al 1 % (lquida, en polvo o evaporada). Suero de [employer]. Yogur descremado o semidescremado. Grasas y Writer no hidrogenadas (sin grasas trans). Aceites vegetales, incluido el de soja, ssamo, girasol, [place], man, crtamo, maz, canola y semillas de algodn. Alios para ensalada o mayonesaelaborados con aceite vegetal. Halina Andreas (mineral o con gas). T y caf. Gaseosas dietticas. Dulces y [employer], gelatina y helado de frutas. Pequeas cantidades de chocolate amargo. Limite todos los dulces y postres. Alios y [employer] y condimentos. Es posible que los productos que se enumeran ms [place] no constituyan una lista completa de los alimentos y las bebidas que puede tomar. Consulte a un nutricionista para conocer ms opciones. Qu alimentos no se recomiendan? Nils Pyle Fruta enlatada en almbar espeso. Frutas con salsa de crema o mantequilla.Frutas cocidas en aceite. Limite el consumo de coco. Verduras Verduras cocinadas con salsas de queso, crema o mantequilla. Verduras fritas. Cereales Panes elaborados con grasas saturadas o trans, aceites o  [employer]. Croissants. Panecillos dulces. Rosquillas. Galletascon alto contenido de grasas, como las que contienen [place]. Carnes y [address] protenas [address] grasas, como perros calientes, [place] de res, salchichas, tocino, asado de Producer, television/film/video o [place]. Fiambres con alto contenido de [place], comosalame y [place]. Caviar. Pato y ganso domsticos. Vsceras, como hgado. Lcteos Crema, crema agria, queso crema y [place] cottage con crema. Quesos enteros. Leche entera o al 2 % (lquida, evaporada o condensada). Suero de [employer].Salsa de crema o queso con alto contenido de [place]. Yogur de [employer]. Grasas y [place] de carne o materia grasa. Manteca de cacao, aceites hidrogenados, aceite de palma, aceite de coco, aceite de palmiste. Grasas y [address] grasas slidas, incluida la grasa del tocino, el cerdo [place], la [place] de cerdo y Civil engineer, contracting. Sustitutos no lcteos de la crema. Aderezos para ensalada conqueso o crema agria. Bebidas Refrescos regulares y cualquier bebida con agregado de azcar. Dulces y [employer]. Pudin. Galletas dulces. Bizcochuelos. Pasteles. Chocolate con leche o chocolate blanco. Almbares con mantequilla. Helados o bebidas elaboradas conhelado con alto contenido de [place]. Es posible que los productos que se enumeran ms arriba no sean una lista completa de los alimentos y las bebidas que se Theatre stage manager. Consulte a un nutricionista para obtener ms informacin. Resumen La planificacin de las comidas cardiosaludables implica limitar las grasas poco saludables, aumentar las grasas saludables y Radio producer otros cambios en la dieta y el estilo de [place]. Baje de peso si es necesario. Perder solo del 5  al 10 % de su peso corporal puede ayudarlo a mejorar su estado de salud general y a prevenir enfermedades, como la diabetes y las enfermedades cardacas. Propngase seguir una dieta equilibrada, que incluya frutas y verduras, productos lcteos descremados o  semidescremados, protenas magras, frutos secos y legumbres, cereales integrales y aceites y grasas cardiosaludables. Esta informacin no tiene como fin reemplazar el consejo del mdico. Asegresede hacerle al mdico cualquier pregunta que tenga. Document Revised: 05/19/2017 Document Reviewed: 05/19/2017 Elsevier Patient Education  2022 Elsevier Inc. https://www.diabeteseducator.org/docs/default-source/living-with-diabetes/conquering-the-grocery-store-v1.pdf?[email or web address]">  Recuento de carbohidratos para la diabetes mellitus en los adultos Carbohydrate Counting for Diabetes Mellitus, Adult El recuento de carbohidratos es un mtodo para llevar un registro de la cantidad de carbohidratos que se ingieren. La ingesta natural de carbohidratos aumenta la cantidad de azcar (glucosa) en la sangre. El recuento de la cantidad de carbohidratos que se ingierenmejora el control del nivel de glucemia, lo que ayuda a manejar la diabetes. Es importante saber la cantidad de carbohidratos que se pueden ingerir en cada comida sin correr ningn riesgo. Esto es diferente en cada persona. Un nutricionista puede ayudarlo a crear un plan de alimentacin y a calcular lacantidad de carbohidratos que debe ingerir en cada comida y colacin. Qu alimentos contienen carbohidratos? Los siguientes alimentos incluyen carbohidratos: Granos, como panes y cereales. Frijoles secos y productos con soja. Verduras con almidn, como papas, guisantes y maz. Frutas y jugos de frutas. Leche y yogur. Dulces y colaciones, como pasteles, galletas, caramelos, papas fritas de bolsa y refrescos. Cmo se calculan los carbohidratos de los alimentos? Hay dos maneras de calcular los carbohidratos de los alimentos. Puede leer las etiquetas de los alimentos o aprender cules son los tamaos de las porciones estndar de los alimentos. Puede usar cualquiera de los dos mtodos o unacombinacin de ambos. Usar la etiqueta de informacin nutricional La  lista de informacin nutricional est incluida en las etiquetas de casi todas las bebidas y los alimentos envasados de los [place]. Incluye lo siguiente: El tamao de la porcin. Informacin sobre los nutrientes de cada porcin, incluidos los gramos (g) de carbohidratos por porcin. Para usar la informacin nutricional: Decida cuntas porciones va a comer. Multiplique la cantidad de porciones por el nmero de carbohidratos por porcin. El resultado es la cantidad total de carbohidratos que comer. Conocer los tamaos de las porciones estndar de los alimentos Cuando coma alimentos que contengan carbohidratos y que no estn envasados o no incluyan la informacin nutricional en la etiqueta, debe medir las porciones para poder calcular la cantidad de carbohidratos. Mida los alimentos que comer con una balanza de alimentos o una taza medidora, si es necesario. Decida cuntas porciones de tamao estndar comer. Multiplique el nmero de porciones por 15. En los alimentos que contienen carbohidratos, una porcin equivale a 15 g de carbohidratos. Por ejemplo, si come 2 tazas o 10 onzas (300 g) de fresas, habr comido 2 porciones y 30 g de carbohidratos (2 porciones x 15 g = 30 g). En el caso de las comidas que contienen mezclas de ms de un alimento, como las sopas y los guisos, debe calcular los carbohidratos de cada alimento que se incluye. La siguiente lista contiene los tamaos de porciones estndar de los alimentos ricos en carbohidratos ms comunes. Cada una de estas porciones tiene aproximadamente 15 g de carbohidratos: 1 rebanada de pan. 1 tortilla de seis pulgadas (15 cm). ? de taza o 2 onzas (53 g) de arroz o pasta cocidos.  taza o 3 onzas (85 g) de   lentejas o frijoles cocidos o enlatados, escurridos y enjuagados.  taza o 3 onzas (85 g) de verduras con almidn, como guisantes, maz o zapallo.  taza o 4 onzas (120 g) de cereal caliente.  taza o 3 onzas (85 g) de papas hervidas o en  pur, o  o 3 onzas (85 g) de una papa grande al horno.  taza o 4 onzas fluidas (118 ml) de jugo de frutas. 1 taza u 8 onzas fluidas (237 ml) de leche. 1 unidad pequea o 4 onzas (106 g) de manzana.  unidad o 2 onzas (63 g) de una banana mediana. 1 taza o 5 oz (150 g) de fresas. 3 tazas o 1 oz (24 g) de palomitas de maz. Cul sera un ejemplo de recuento de carbohidratos? Para calcular el nmero de carbohidratos de este ejemplo de comida, siga lospasos que se describen a continuacin. Ejemplo de comida 3 onzas (85 g) de pechugas de pollo. ? de taza o 4 onzas (106 g) de arroz integral.  taza o 3 onzas (85 g) de maz. 1 taza u 8 onzas fluidas (237 ml) de leche. 1 taza o 5 onzas (150 g) de fresas con crema batida sin azcar. Clculo de carbohidratos Identifique los alimentos que contienen carbohidratos: Arroz. Maz. Leche. Jinny Sanders. Calcule cuntas porciones come de cada alimento: 2 porciones de arroz. 1 porcin de maz. 1 porcin de leche. 1 porcin de fresas. Multiplique cada nmero de porciones por 15 g: 2 porciones de arroz x 15 g = 30 g. 1 porcin de maz x 15 g = 15 g. 1 porcin de leche x 15 g = 15 g. 1 porcin de fresas x 15 g = 15 g. Sume todas las cantidades para conocer el total de gramos de carbohidratos consumidos: 30 g + 15 g + 15 g + 15 g = 75 g de carbohidratos en total. Consejos para seguir este plan Al ir de compras [place] un plan de comidas y luego haga una lista de compras. Compre verduras frescas y congeladas, frutas frescas y congeladas, productos lcteos, huevos, frijoles, lentejas y cereales integrales. Fjese en las etiquetas de los alimentos. Elija los alimentos que tengan ms fibra y Neurosurgeon. Evite los alimentos procesados y los alimentos con Biochemist, clinical. Planificacin de las comidas Trate de consumir la misma cantidad de carbohidratos en cada comida y en cada colacin. Planifique tomar comidas y colaciones regulares y  equilibradas. Dnde buscar ms informacin American Diabetes Association (Asociacin Estadounidense de la Diabetes): www.diabetes.org Centers for Disease Control and Prevention (Centros para el Control y la Prevencin de Event organiser): FootballExhibition.com.br Resumen El recuento de carbohidratos es un mtodo para llevar un registro de la cantidad de carbohidratos que se ingieren. La ingesta natural de carbohidratos aumenta la cantidad de azcar (glucosa) en la sangre. El recuento de la cantidad de carbohidratos que se ingieren mejora el control del nivel de [place], lo que ayuda a Company secretary la diabetes. Un nutricionista puede ayudarlo a crear un plan de alimentacin y a calcular la cantidad de carbohidratos que debe ingerir en cada comida y colacin. Esta informacin no tiene Theme park manager el consejo del mdico. Asegresede hacerle al mdico cualquier pregunta que tenga. Document Revised: 03/17/2019 Document Reviewed: 03/17/2019 Elsevier Patient Education  2021 [employer].

## 2020-08-06 ENCOUNTER — Ambulatory Visit (INDEPENDENT_AMBULATORY_CARE_PROVIDER_SITE_OTHER): Payer: No Typology Code available for payment source | Admitting: Surgery

## 2020-08-06 ENCOUNTER — Encounter: Payer: Self-pay | Admitting: Surgery

## 2020-08-06 ENCOUNTER — Other Ambulatory Visit: Payer: Self-pay

## 2020-08-06 VITALS — BP 127/82 | HR 71 | Temp 98.2°F | Ht 59.5 in | Wt 172.8 lb

## 2020-08-06 DIAGNOSIS — K458 Other specified abdominal hernia without obstruction or gangrene: Secondary | ICD-10-CM

## 2020-08-06 MED ORDER — ABDOMINAL BINDER/ELASTIC 2XL MISC: 1.0000 | Freq: Every day | 0 refills | Status: AC
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
## 2020-08-06 NOTE — Patient Instructions (Addendum)
Try to lose weight if possible. A referral to [facility] has been placed. They will call you with an appointment. Please pick up the abdominal binder at the medical supply store.  If you have any concerns or questions, please feel free to call our office.   Hernia, en adultos Hernia, Adult     Una hernia ocurre cuando tejido dentro del cuerpo protruye a travs de un punto debilitado de los msculos del vientre (pared abdominal). Esto provoca una protuberancia redondeada (bulto). Esta protuberancia puede estar: En una cicatriz de una ciruga realizada en su vientre (hernia incisional). Cerca de la parte inferior del vientre (hernia umbilical). En la ingle (hernia inguinal). La ingle es Immunologist en el que se unen las piernas con la parte inferior del vientre (abdomen). Este tipo de hernia tambin puede aparecer en: En el escroto, si es varn. En los pliegues de la piel alrededor de la vagina, si es [place]. En la parte superior del muslo (hernia crural). Dentro del vientre (hernia de hiato). Se produce cuando el estmago se desliza por arriba del msculo entre el vientre y el trax (diafragma). Si la hernia es pequea y no causa dolor, tal vez no sea necesario Pensions consultant. Si la hernia es grande y Passenger transport manager, tal vez necesite realizarseuna [place]. Siga estas indicaciones en su casa: Actividad Psychiatrist o Clinical biochemist (esforzar) los msculos que estn cerca de la hernia. El esfuerzo puede ocurrir cuando: Levanta algo pesado. Defeca (hace deposiciones). No levante ningn objeto que pese ms de 10 libras (4,5 kg), o el lmite que su mdico le diga, hasta que le comunique que es seguro. Para levantar objetos pesados, use la fuerza de las piernas. No use solo los msculos de la espalda para levantarlos. Instrucciones generales Haga estas cosas si se lo indica su mdico para no tener dificultad para defecar (estreimiento): Beba suficiente lquido para mantener el pis (orina) de color  amarillo plido. Coma alimentos ricos en fibra. Entre ellos, frutas y verduras frescas, cereales integrales y frijoles. Limite los alimentos con alto contenido de grasas y azcares procesados. Estos incluyen alimentos fritos o dulces. Tome medicamentos para la dificultad para defecar. Cuando tosa, hgalo con suavidad. Puede empujar su hernia hacia adentro presionando muy suavemente sobre esta cuando est acostado. No intente forzar el bulto hacia adentro nuevamente si este no entra fcilmente. Si tiene sobrepeso, trabaje con el mdico para adelgazar sin riegos. No consuma ningn producto que contenga nicotina o tabaco. Esto incluye cigarrillos y cigarrillos electrnicos. Si necesita ayuda para dejar de fumar, consulte al [employer]. Si le programan una ciruga (reparacin de una hernia), controle la hernia para detectar cualquier cambio en la forma, tamao o color. Informe al mdico si observa algn cambio. Tome los medicamentos de venta libre y los recetados solamente como se lo haya indicado el mdico. Oceanographer a todas las visitas de control como se lo haya indicado el mdico. Comunquese con un mdico si: Tiene un dolor nuevo, hinchazn o enrojecimiento cerca de la hernia. Defeca menos veces por semana que lo normal. Tiene dificultad para defecar. La materia fecal (heces) es ms seca que lo normal. Tiene heces que son ms duras o grandes que lo normal. Solicite ayuda de inmediato si: Tiene fiebre. Siente un dolor en la zona baja del vientre que empeora. Siente malestar estomacal (nuseas). Vomita. No puede empujar su hernia hacia adentro al presionar muy suavemente sobre esta cuando est [place]. No intente forzar el bulto hacia adentro nuevamente si este no  entra fcilmente. La hernia: Cambia de forma o de tamao. Cambia de color. Se siente dura o le duele cuando la toca. Estos sntomas pueden representar un problema grave que constituye Radio broadcast assistant. No espere hasta que los sntomas  desaparezcan. Solicite atencin mdica de inmediato. Comunquese con el servicio de emergencias de su localidad (911 en los [place]). Resumen Una hernia ocurre cuando tejido dentro del cuerpo protruye a travs de un punto debilitado de los msculos del vientre. Esto crea un bulto. Si la hernia es pequea y no causa dolor, tal vez no sea necesario Pensions consultant. Si la hernia es grande y Passenger transport manager, tal vez necesite realizarse [place]. Si le programan una ciruga, controle la hernia para detectar cualquier cambio en la forma, tamao o color. Informe al mdico si hay algn cambio. Esta informacin no tiene Theme park manager el consejo del mdico. Asegresede hacerle al mdico cualquier pregunta que tenga. Document Revised: 05/04/2017 Document Reviewed: 01/24/2017 Elsevier Patient Education  2021 [employer].

## 2020-08-16 ENCOUNTER — Ambulatory Visit: Payer: Self-pay | Admitting: Pharmacy Technician

## 2020-08-16 ENCOUNTER — Other Ambulatory Visit: Payer: Self-pay

## 2020-08-16 DIAGNOSIS — Z79899 Other long term (current) drug therapy: Secondary | ICD-10-CM

## 2020-08-16 NOTE — Progress Notes (Signed)
Patient speaks Spanish.  Interpretation provided by Maria, ID# [id number], [employer].  [facility] application and contract.  Patient agreed to all terms of the [facility] contract.    Patient approved to receive medication assistance at [facility] until time for re-certification in 2023, and as long as eligibility criteria continues to be met.   Provided patient with community resource material based on her particular needs.    Kristen Aguirre Care Manager [facility]  

## 2020-08-18 ENCOUNTER — Telehealth: Payer: Self-pay

## 2020-08-18 NOTE — Telephone Encounter (Signed)
Faxed referral to Dr. Paulla Fore @ [facility] at [phone].

## 2020-08-26 ENCOUNTER — Other Ambulatory Visit: Payer: Self-pay

## 2020-09-01 ENCOUNTER — Encounter: Payer: Self-pay | Admitting: Gerontology

## 2020-09-01 ENCOUNTER — Ambulatory Visit: Payer: Self-pay | Admitting: Gerontology

## 2020-09-01 ENCOUNTER — Other Ambulatory Visit: Payer: Self-pay

## 2020-09-01 VITALS — BP 114/76 | HR 74 | Temp 97.2°F | Resp 16 | Ht 59.0 in | Wt 178.2 lb

## 2020-09-01 DIAGNOSIS — Z8639 Personal history of other endocrine, nutritional and metabolic disease: Secondary | ICD-10-CM

## 2020-09-01 DIAGNOSIS — Z794 Long term (current) use of insulin: Secondary | ICD-10-CM

## 2020-09-01 DIAGNOSIS — E1165 Type 2 diabetes mellitus with hyperglycemia: Secondary | ICD-10-CM

## 2020-09-01 DIAGNOSIS — E559 Vitamin D deficiency, unspecified: Secondary | ICD-10-CM

## 2020-09-01 DIAGNOSIS — E785 Hyperlipidemia, unspecified: Secondary | ICD-10-CM

## 2020-09-01 MED ORDER — ATORVASTATIN CALCIUM 20 MG PO TABS
20.0000 mg | ORAL_TABLET | Freq: Every day | ORAL | 3 refills | Status: DC
  Filled 2020-09-01: qty 30, 30d supply, fill #0
  Filled 2020-10-27: qty 30, 30d supply, fill #1
  Filled 2020-12-08: qty 30, 30d supply, fill #2
  Filled 2021-01-26: qty 30, 30d supply, fill #3

## 2020-09-01 MED ORDER — METFORMIN HCL 500 MG PO TABS
500.0000 mg | ORAL_TABLET | Freq: Two times a day (BID) | ORAL | 3 refills | Status: DC
  Filled 2020-09-01: qty 60, 30d supply, fill #0
  Filled 2020-10-27: qty 60, 30d supply, fill #1
  Filled 2020-12-08: qty 60, 30d supply, fill #2
  Filled 2021-01-26: qty 60, 30d supply, fill #3

## 2020-09-01 MED ORDER — LEVOTHYROXINE SODIUM 50 MCG PO TABS
ORAL_TABLET | Freq: Every day | ORAL | 2 refills | Status: DC
  Filled 2020-09-01: qty 30, 30d supply, fill #0
  Filled 2020-10-27: qty 30, 30d supply, fill #1

## 2020-09-01 NOTE — Patient Instructions (Signed)
Plan de alimentacin cardiosaludable Heart-Healthy Eating Plan Muchos factores influyen en la salud del corazn (coronaria), entre ellos, los hbitos de alimentacin y de ejercicio fsico. El riesgo coronario aumenta cuando hay niveles anormales de grasa (lpidos) en la [place]. La planificacin de las comidas cardiosaludables implica limitar las grasas poco saludables, aumentar las grasas saludables y Air traffic controller cambios en la dieta y el estilo de [place]. En qu consiste el plan? El mdico podra recomendarle que haga lo siguiente: Limitar la ingesta de grasas al _________ % o menos del total de caloras por da. Limitar la ingesta de grasas saturadas al _________ % o menos del total de caloras por da. Limitar la cantidad de colesterol en su dieta a menos de _________ mg por da. Cules son algunos consejos para seguir este plan? Al cocinar Evite frer los alimentos a la hora de la coccin. Hornear, hervir, grillar y asar a la parrilla son buenas opciones. Otras formas de reducir el consumo de grasas son las siguientes: Quite la piel de las aves. Quite todas las grasas visibles de las carnes. Cocine al vapor las verduras en agua o caldo. Planificacin de las comidas  En las comidas, imagine dividir su plato en cuartos: Llene la mitad del plato con verduras y ensaladas de hojas verdes. Llene un cuarto del plato con cereales integrales. Llene un cuarto del plato con alimentos con protenas magras. Coma 4 o 5 porciones de verduras por da. Una porcin equivale a una taza de verduras crudas o cocidas o a 2 tazas de verduras de hojas verdes crudas. Coma 4 o 5 porciones de frutas por da. Una porcin equivale a una fruta mediana entera,  taza de fruta desecada;  taza de frutas frescas, congeladas o enlatadas; o  taza de jugo 100 % de fruta. Consuma ms alimentos con fibra soluble. Entre ellos, se incluyen las [place], el [place], las [place], los frijoles, los guisantes y Aeronautical engineer. Trate de  consumir de 25 a 30 g de [employer]. Aumente el consumo de legumbres, frutos secos y semillas a 4 o 5 porciones por semana. Una porcin de frijoles o legumbres secos equivale a  taza despus de su coccin, una porcin de frutos secos equivale a  de taza y [place] porcin de semillas equivale a 1 cucharada.  Grasas Elija grasas saludables con mayor frecuencia. Elija las grasas monoinsaturadas y [address], como el aceite de oliva y el de canola, las semillas de [place], las nueces, las almendras y las semillas. Consuma ms grasas omega-3. Elija salmn, caballa, sardinas, atn, aceite de lino y semillas de lino molidas. Propngase comer pescado al [employer] veces por semana. Lea las etiquetas de los alimentos detenidamente para identificar los que contienen grasas trans o altas cantidades de grasas saturadas. Limite el consumo de grasas saturadas. Estas se encuentran en productos de origen animal, como carnes, mantequilla y crema. Las grasas saturadas de origen vegetal incluyen aceite de palma, de palmiste y de coco. Evite los alimentos con aceites parcialmente hidrogenados. Estos contienen grasas trans. [address] [place], se incluyen margarinas en barra, algunas margarinas untables, galletas dulces y [place] y otros productos horneados. Evite las comidas fritas. Informacin general Consuma ms comida casera y menos de restaurante, de bares y comida rpida. Limite o evite el alcohol. Limite los alimentos con alto contenido de almidn y International aid/development worker. Baje de peso si es necesario. Perder solo del 5 al 10 % de su peso corporal puede ayudarlo a mejorar su estado de salud general y a Education officer, museum,  como la diabetes y las enfermedades cardacas. Controle la ingesta de sal (sodio), especialmente si tiene presin arterial alta. Hable con el mdico acerca de cambiar la ingesta de sodio. Intente incorporar ms comidas vegetarianas cada semana. Qu alimentos puedo comer? Nils Pyle Nils Pyle frescas, en  conserva (en su jugo natural) o frutas congeladas. Verduras Verduras frescas o congeladas (crudas, al vapor, asadas o grilladas). Ensaladasde hojas verdes. Cereales La mayora de los cereales. Elija casi siempre trigo integral y Radiation protection practitioner. Arroz y pastas, incluido el arroz integral y las pastas elaboradascon trigo integral. Armed forces operational officer y otras protenas [place] magras de res, ternera, cerdo y cordero a las que se les haya quitado la grasa visible. Pollo y pavo sin piel. Todos los pescados y [employer]. Pato salvaje, conejo, faisn y venado. Claras de huevo o sustitutos del huevo bajos en colesterol. Porotos, guisantes y lentejas secos y tofu. Semillas y Financial controller de los frutos secos. Lcteos Quesos descremados y semidescremados, entre ellos, ricota y Garment/textile technologist. Leche descremada o al 1 % (lquida, en polvo o evaporada). Suero de [employer]. Yogur descremado o semidescremado. Grasas y Writer no hidrogenadas (sin grasas trans). Aceites vegetales, incluido el de soja, ssamo, girasol, [place], man, crtamo, maz, canola y semillas de algodn. Alios para ensalada o mayonesaelaborados con aceite vegetal. Halina Andreas (mineral o con gas). T y caf. Gaseosas dietticas. Dulces y [employer], gelatina y helado de frutas. Pequeas cantidades de chocolate amargo. Limite todos los dulces y postres. Alios y [employer] y condimentos. Es posible que los productos que se enumeran ms [place] no constituyan una lista completa de los alimentos y las bebidas que puede tomar. Consulte a un nutricionista para conocer ms opciones. Qu alimentos no se recomiendan? Nils Pyle Fruta enlatada en almbar espeso. Frutas con salsa de crema o mantequilla.Frutas cocidas en aceite. Limite el consumo de coco. Verduras Verduras cocinadas con salsas de queso, crema o mantequilla. Verduras fritas. Cereales Panes elaborados con grasas saturadas o trans, aceites o  [employer]. Croissants. Panecillos dulces. Rosquillas. Galletascon alto contenido de grasas, como las que contienen [place]. Carnes y [address] protenas [address] grasas, como perros calientes, [place] de res, salchichas, tocino, asado de Producer, television/film/video o [place]. Fiambres con alto contenido de [place], comosalame y [place]. Caviar. Pato y ganso domsticos. Vsceras, como hgado. Lcteos Crema, crema agria, queso crema y [place] cottage con crema. Quesos enteros. Leche entera o al 2 % (lquida, evaporada o condensada). Suero de [employer].Salsa de crema o queso con alto contenido de [place]. Yogur de [employer]. Grasas y [place] de carne o materia grasa. Manteca de cacao, aceites hidrogenados, aceite de palma, aceite de coco, aceite de palmiste. Grasas y [address] grasas slidas, incluida la grasa del tocino, el cerdo [place], la [place] de cerdo y Civil engineer, contracting. Sustitutos no lcteos de la crema. Aderezos para ensalada conqueso o crema agria. Bebidas Refrescos regulares y cualquier bebida con agregado de azcar. Dulces y [employer]. Pudin. Galletas dulces. Bizcochuelos. Pasteles. Chocolate con leche o chocolate blanco. Almbares con mantequilla. Helados o bebidas elaboradas conhelado con alto contenido de [place]. Es posible que los productos que se enumeran ms arriba no sean una lista completa de los alimentos y las bebidas que se Theatre stage manager. Consulte a un nutricionista para obtener ms informacin. Resumen La planificacin de las comidas cardiosaludables implica limitar las grasas poco saludables, aumentar las grasas saludables y Radio producer otros cambios en la dieta y el estilo de [place]. Baje de peso si es necesario. Perder solo del 5  al 10 % de su peso corporal puede ayudarlo a mejorar su estado de salud general y a Education officer, museum, como la diabetes y las enfermedades cardacas. Propngase seguir una dieta equilibrada, que incluya frutas y verduras, productos lcteos descremados o  semidescremados, protenas magras, frutos secos y legumbres, cereales integrales y aceites y grasas cardiosaludables. Esta informacin no tiene Theme park manager el consejo del mdico. Asegresede hacerle al mdico cualquier pregunta que tenga. Document Revised: 05/19/2017 Document Reviewed: 05/19/2017 Elsevier Patient Education  2022 [employer]. https://www.diabeteseducator.org/docs/default-source/living-with-diabetes/conquering-the-grocery-store-v1.pdf?[email or web address]">  Recuento de carbohidratos para la diabetes mellitus en los adultos Carbohydrate Counting for Diabetes Mellitus, Adult El recuento de carbohidratos es un mtodo para llevar un registro de la cantidad de carbohidratos que se ingieren. La ingesta natural de carbohidratos aumenta la cantidad de azcar (glucosa) en la sangre. El recuento de la cantidad de carbohidratos que se ingierenmejora el control del nivel de [place], lo que ayuda a Company secretary la diabetes. Es importante saber la cantidad de carbohidratos que se pueden ingerir en cada comida sin correr Surveyor, minerals. Esto es Government social research officer. Un nutricionista puede ayudarlo a crear un plan de alimentacin y a calcular lacantidad de carbohidratos que debe ingerir en cada comida y colacin. Qu alimentos contienen carbohidratos? Los siguientes alimentos incluyen carbohidratos: Granos, como panes y cereales. Frijoles secos y productos con soja. Verduras con almidn, como papas, guisantes y maz. Nils Pyle y jugos de frutas. Leche y Dentist. Dulces y colaciones, como pasteles, galletas, caramelos, papas fritas de bolsa y refrescos. Cmo se calculan los carbohidratos de los alimentos? Hay dos maneras de calcular los carbohidratos de los alimentos. Puede leer las etiquetas de los alimentos o aprender cules son los tamaos de las porciones estndar de los alimentos. Puede usar cualquiera de [address] o [place] de [place]. Usar la Air cabin crew de informacin nutricional La  lista de informacin nutricional est incluida en las etiquetas de casi todas las bebidas y los alimentos envasados de los [place]. Incluye lo siguiente: El tamao de la porcin. Informacin sobre los nutrientes de cada porcin, incluidos los gramos (g) de carbohidratos por porcin. Para usar la informacin nutricional: Decida cuntas porciones va a comer. Multiplique la cantidad de porciones por el nmero de carbohidratos por porcin. El resultado es la cantidad total de carbohidratos que comer. Conocer los tamaos de las porciones estndar de los alimentos Cuando coma alimentos que contengan carbohidratos y que no estn envasados o no incluyan la informacin nutricional en la etiqueta, debe medir las porciones para poder calcular la cantidad de carbohidratos. Mida los alimentos que comer con una balanza de alimentos o una taza medidora, si es necesario. Decida cuntas porciones de Programmer, systems. Multiplique el nmero de porciones por 15. En los alimentos que contienen carbohidratos, una porcin Hypericum a 15 g de carbohidratos. Por ejemplo, si come 2 tazas o 10 onzas (300 g) de fresas, habr comido 2 porciones y 30 g de carbohidratos (2 porciones x 15 g = 30 g). En el caso de las comidas que contienen mezclas de ms de un alimento, como las sopas y los guisos, debe calcular los carbohidratos de cada alimento que se incluye. La siguiente lista contiene los tamaos de porciones estndar de los alimentos ricos en carbohidratos ms comunes. Cada una de estas porciones tiene aproximadamente 15 g de carbohidratos: 1 rebanada de pan. 1 tortilla de seis pulgadas (15 cm). ? de taza o 2 onzas (53 g) de arroz o pasta cocidos.  taza o 3 onzas (85 g) de  lentejas o frijoles cocidos o enlatados, escurridos y enjuagados.  taza o 3 onzas (85 g) de verduras con almidn, como guisantes, maz o zapallo.  taza o 4 onzas (120 g) de cereal caliente.  taza o 3 onzas (85 g) de papas hervidas o en  pur, o  o 3 onzas (85 g) de una papa grande al horno.  taza o 4 onzas fluidas (118 ml) de jugo de frutas. 1 taza u 8 onzas fluidas (237 ml) de leche. 1 unidad pequea o 4 onzas (106 g) de manzana.  unidad o 2 onzas (63 g) de una banana mediana. 1 taza o 5 oz (150 g) de fresas. 3 tazas o 1 oz (24 g) de palomitas de maz. Cul sera un ejemplo de recuento de carbohidratos? Para calcular el nmero de carbohidratos de este ejemplo de comida, siga lospasos que se describen a continuacin. Ejemplo de comida 3 onzas (85 g) de pechugas de pollo. ? de taza o 4 onzas (106 g) de arroz integral.  taza o 3 onzas (85 g) de maz. 1 taza u 8 onzas fluidas (237 ml) de leche. 1 taza o 5 onzas (150 g) de fresas con crema batida sin azcar. Clculo de carbohidratos Identifique los alimentos que contienen carbohidratos: Arroz. Maz. Leche. Jinny Sanders. Calcule cuntas porciones come de cada alimento: 2 porciones de arroz. 1 porcin de maz. 1 porcin de leche. 1 porcin de fresas. Multiplique cada nmero de porciones por 15 g: 2 porciones de arroz x 15 g = 30 g. 1 porcin de maz x 15 g = 15 g. 1 porcin de leche x 15 g = 15 g. 1 porcin de fresas x 15 g = 15 g. Sume todas las cantidades para conocer el total de gramos de carbohidratos consumidos: 30 g + 15 g + 15 g + 15 g = 75 g de carbohidratos en total. Consejos para seguir este plan Al ir de compras [place] un plan de comidas y luego haga una lista de compras. Compre verduras frescas y congeladas, frutas frescas y congeladas, productos lcteos, huevos, frijoles, lentejas y cereales integrales. Fjese en las etiquetas de los alimentos. Elija los alimentos que tengan ms fibra y Neurosurgeon. Evite los alimentos procesados y los alimentos con Biochemist, clinical. Planificacin de las comidas Trate de consumir la misma cantidad de carbohidratos en cada comida y en cada colacin. Planifique tomar comidas y colaciones regulares y  equilibradas. Dnde buscar ms informacin American Diabetes Association (Asociacin Estadounidense de la Diabetes): www.diabetes.org Centers for Disease Control and Prevention (Centros para el Control y la Prevencin de Event organiser): FootballExhibition.com.br Resumen El recuento de carbohidratos es un mtodo para llevar un registro de la cantidad de carbohidratos que se ingieren. La ingesta natural de carbohidratos aumenta la cantidad de azcar (glucosa) en la sangre. El recuento de la cantidad de carbohidratos que se ingieren mejora el control del nivel de [place], lo que ayuda a Company secretary la diabetes. Un nutricionista puede ayudarlo a crear un plan de alimentacin y a calcular la cantidad de carbohidratos que debe ingerir en cada comida y colacin. Esta informacin no tiene Theme park manager el consejo del mdico. Asegresede hacerle al mdico cualquier pregunta que tenga. Document Revised: 03/17/2019 Document Reviewed: 03/17/2019 Elsevier Patient Education  2021 [employer].

## 2020-09-01 NOTE — Progress Notes (Signed)
Established Patient Office Visit  Subjective:  Patient ID: Kristen Aguirre, female    DOB: 1965/08/08  Age: 55 y.o. MRN: [id number]  CC:  Chief Complaint  Patient presents with   Follow-up   Diabetes    Patient is checking blood sugars at home and brought her log to her appointment today.    HPI Kristen Aguirre is a 55 year old female who has history of type 2 diabetes, thyroid disease, hyperlipidemia, presents for medication refill. [place] interpreter Mr Allena Katz [id number] interpreted in [place] during visit. Her HgbA1c done on 07/21/20 was 12.6%. She's compliant with her medication checks her blood glucose bid. Per her fasting blood glucose reading ranges between 106-164 mg/dl and her evening readings ranges between 147-190 mg/dl.  She denies hypo-/hyperglycemic symptoms, peripheral neuropathy and performs daily foot checks.  She has not had her eye exam.  She states that she's out of her Metformin, Levothyroxine and Crestor.  She was seen by general surgery Dr. Hampton Abbot and on 08/06/2020 for right lumbar hernia.  She was referred to [facility] general surgery for evaluation.  She denies chest pain, palpitation, lightheadedness, myalgia, cold and heat intolerance.  Overall, she states that she is doing well on and offers no further complaints.     Past Medical History:  Diagnosis Date   Closed dislocation of left hip ([place]) 11/19/2019   Diabetes mellitus without complication ([place])    Displaced fracture of posterior wall of left acetabulum, initial encounter for closed fracture ([place]) 11/19/2019   Thyroid disease     Past Surgical History:  Procedure Laterality Date   CLOSED REDUCTION FINGER WITH PERCUTANEOUS PINNING Left 12/02/2019   Procedure: CLOSED REDUCTION FINGER WITH PERCUTANEOUS PINNING SMALL FINGER;  Surgeon: Leanora Cover, MD;  Location: [place];  Service: Orthopedics;  Laterality: Left;   EXTREMITY WIRE/PIN REMOVAL Left 01/13/2020   Procedure: REMOVAL  K-WIRE/PIN EXTREMITY;  Surgeon: Leanora Cover, MD;  Location: [place];  Service: Orthopedics;  Laterality: Left;   FRACTURE SURGERY     HARDWARE REMOVAL Right 01/13/2020   Procedure: EXTERNAL FIXATOR RIGHT DISTAL RADIUS   ;  Surgeon: Leanora Cover, MD;  Location: [place];  Service: Orthopedics;  Laterality: Right;   HIP CLOSED REDUCTION Left 11/15/2019   Procedure: CLOSED REDUCTION HIP;  Surgeon: Nicholes Stairs, MD;  Location: [place];  Service: Orthopedics;  Laterality: Left;   I & D EXTREMITY Right 11/15/2019   Procedure: IRRIGATION AND DEBRIDEMENT; EXPLORATION POSSIBLE REPAIR OF TENDON AND NERVE;  Surgeon: Leanora Cover, MD;  Location: [place];  Service: Orthopedics;  Laterality: Right;   OPEN REDUCTION INTERNAL FIXATION (ORIF) DISTAL RADIAL FRACTURE Right 11/15/2019   Procedure: DISTAL RADIAL FRACTURE -RIGHT  EXTERNAL FIXATION; IRRIGATION AND DEBRIDEMENT LEFT SMALL FINGER WITH CLOSURE LACERATION. ;  Surgeon: Leanora Cover, MD;  Location: [place];  Service: Orthopedics;  Laterality: Right;  LEFT SMALL FINGER IRRIGATION AND DEBRIDEMENT WITH CLOSURE LACERATION ADDED.   OPEN REDUCTION INTERNAL FIXATION (ORIF) DISTAL RADIAL FRACTURE Right 12/02/2019   Procedure: OPEN REDUCTION RADIUS WITH PERCUTANEOU PINNING;  Surgeon: Leanora Cover, MD;  Location: [place];  Service: Orthopedics;  Laterality: Right;   OPEN REDUCTION INTERNAL FIXATION ACETABULUM FRACTURE POSTERIOR Left 11/17/2019   Procedure: OPEN REDUCTION INTERNAL FIXATION ACETABULUM FRACTURE POSTERIOR;  Surgeon: [place], MD;  Location: [place];  Service: Orthopedics;  Laterality: Left;    Family History  Problem Relation Age of Onset   Kidney disease Mother    Diabetes Mother  Other Father        unknown medical history   Kidney disease Sister    Diabetes Sister    Diabetes Maternal Aunt    Diabetes Maternal Grandfather     Social History   Socioeconomic History   Marital status: Single    Spouse  name: Not on file   Number of children: Not on file   Years of education: Not on file   Highest education level: Not on file  Occupational History   Not on file  Tobacco Use   Smoking status: Never   Smokeless tobacco: Never  Vaping Use   Vaping Use: Never used  Substance and Sexual Activity   Alcohol use: Yes    Comment: social - once per month   Drug use: Never   Sexual activity: Not on file  Other Topics Concern   Not on file  Social History Narrative   Not on file   Social Determinants of Health   Financial Resource Strain: Not on file  Food Insecurity: No Food Insecurity   Worried About Running Out of Food in the Last Year: Never true   Ran Out of Food in the Last Year: Never true  Transportation Needs: No Transportation Needs   Lack of Transportation (Medical): No   Lack of Transportation (Non-Medical): No  Physical Activity: Not on file  Stress: Not on file  Social Connections: Not on file  Intimate Partner Violence: Not on file    Outpatient Medications Prior to Visit  Medication Sig Dispense Refill   Blood Glucose Monitoring Suppl (BLOOD GLUCOSE MONITOR SYSTEM) w/Device KIT Use up to 4 times daily as directed 1 kit 0   glucose blood (RIGHTEST GS550 BLOOD GLUCOSE) test strip USED AS DIRECTED UP TO 4 TIMES DAILY. 100 each 0   Insulin Glargine (BASAGLAR KWIKPEN) 100 UNIT/ML Inject 14 Units into the skin at bedtime. 15 mL 3   Insulin Pen Needle 32G X 4 MM MISC Use as directed 100 each 3   Rightest GL300 Lancets MISC USE AS DIRECTED UP TO 4 TIMES DAILY. 100 each 0   Elastic Bandages & Supports (ABDOMINAL BINDER/ELASTIC 2XL) MISC 1 each by Does not apply route daily. 1 each 0   atorvastatin (LIPITOR) 20 MG tablet Take 1 tablet (20 mg total) by mouth daily. (Patient not taking: No sig reported) 30 tablet 0   levothyroxine (SYNTHROID) 50 MCG tablet TAKE 1 TABLET (50 MCG TOTAL) BY MOUTH DAILY BEFORE BREAKFAST. (Patient not taking: Reported on 08/06/2020) 30 tablet 0    metFORMIN (GLUCOPHAGE) 500 MG tablet Take 1 tablet (500 mg total) by mouth daily with breakfast. (Patient not taking: Reported on 08/06/2020) 30 tablet 0   Vitamin D, Ergocalciferol, (DRISDOL) 1.25 MG (50000 UNIT) CAPS capsule Take 1 capsule (50,000 Units total) by mouth every 7 (seven) days. (Patient not taking: Reported on 09/01/2020) 12 capsule 0   No facility-administered medications prior to visit.    No Known Allergies  ROS Review of Systems  Constitutional: Negative.   Eyes: Negative.   Respiratory: Negative.    Cardiovascular: Negative.   Endocrine: Negative.   Skin: Negative.   Neurological: Negative.   Psychiatric/Behavioral: Negative.       Objective:    Physical Exam HENT:     Head: Normocephalic and atraumatic.  Eyes:     Extraocular Movements: Extraocular movements intact.     Conjunctiva/sclera: Conjunctivae normal.     Pupils: Pupils are equal, round, and reactive to light.  Cardiovascular:  Rate and Rhythm: Normal rate and regular rhythm.     Pulses: Normal pulses.     Heart sounds: Normal heart sounds.  Pulmonary:     Effort: Pulmonary effort is normal.     Breath sounds: Normal breath sounds.  Skin:    General: Skin is warm.  Neurological:     General: No focal deficit present.     Mental Status: She is alert and oriented to person, place, and time. Mental status is at baseline.  Psychiatric:        Mood and Affect: Mood normal.        Behavior: Behavior normal.        Thought Content: Thought content normal.        Judgment: Judgment normal.    BP 114/76 (BP Location: Right Arm, Patient Position: Sitting, Cuff Size: Large)   Pulse 74   Temp (!) 97.2 F (36.2 C)   Resp 16   Ht $R'4\' 11"'Ty$  (1.499 m)   Wt 178 lb 3.2 oz (80.8 kg)   LMP 07/21/1997 (Approximate)   SpO2 97%   BMI 35.99 kg/m  Wt Readings from Last 3 Encounters:  09/01/20 178 lb 3.2 oz (80.8 kg)  08/06/20 172 lb 12.8 oz (78.4 kg)  08/04/20 174 lb 3.2 oz (79 kg)   Encouraged  weight loss  Health Maintenance Due  Topic Date Due   PNEUMOCOCCAL POLYSACCHARIDE VACCINE AGE 20-64 HIGH RISK  Never done   COVID-19 Vaccine (1) Never done   Pneumococcal Vaccine 26-42 Years old (1 - PCV) Never done   FOOT EXAM  Never done   OPHTHALMOLOGY EXAM  Never done   Hepatitis C Screening  Never done   Zoster Vaccines- Shingrix (1 of 2) Never done   PAP SMEAR-Modifier  Never done   COLONOSCOPY (Pts 45-58yrs Insurance coverage will need to be confirmed)  Never done   MAMMOGRAM  05/05/2020    There are no preventive care reminders to display for this patient.  Lab Results  Component Value Date   TSH 59.800 (H) 07/21/2020   Lab Results  Component Value Date   WBC 5.3 07/21/2020   HGB 16.2 (H) 07/21/2020   HCT 48.5 (H) 07/21/2020   MCV 96 07/21/2020   PLT 176 07/21/2020   Lab Results  Component Value Date   NA 136 07/21/2020   K 4.2 07/21/2020   CO2 25 07/21/2020   GLUCOSE 327 (H) 07/21/2020   BUN 10 07/21/2020   CREATININE 0.70 07/21/2020   BILITOT 0.6 07/21/2020   ALKPHOS 133 (H) 07/21/2020   AST 27 07/21/2020   ALT 28 07/21/2020   PROT 7.9 07/21/2020   ALBUMIN 4.5 07/21/2020   CALCIUM 9.1 07/21/2020   ANIONGAP 11 01/13/2020   EGFR 103 07/21/2020   Lab Results  Component Value Date   CHOL 341 (H) 07/21/2020   Lab Results  Component Value Date   HDL 68 07/21/2020   Lab Results  Component Value Date   LDLCALC 229 (H) 07/21/2020   Lab Results  Component Value Date   TRIG 223 (H) 07/21/2020   Lab Results  Component Value Date   CHOLHDL 5.0 (H) 07/21/2020   Lab Results  Component Value Date   HGBA1C 12.6 (A) 07/21/2020   HGBA1C 12.6 07/21/2020   HGBA1C 12.6 (A) 07/21/2020   HGBA1C 12.6 (A) 07/21/2020      Assessment & Plan:    1. Elevated lipids -She will continue on current medication, low-fat and cholesterol diet, and exercise  as tolerated. - atorvastatin (LIPITOR) 20 MG tablet; Take 1 tablet (20 mg total) by mouth daily.  Dispense: 30  tablet; Refill: 3 - Lipid panel; Future  2. History of hypothyroidism -She will continue on current medication. - levothyroxine (SYNTHROID) 50 MCG tablet; TAKE 1 TABLET (50 MCG TOTAL) BY MOUTH DAILY BEFORE BREAKFAST.  Dispense: 30 tablet; Refill: 2 - TSH; Future  3. Type 2 diabetes mellitus with hyperglycemia, with long-term current use of insulin (HCC) -Her hemoglobin A1c was 12.6%, her goal should be less than 7%.  She will continue on current medication, low-carb/no concentrated sweet diet and exercise as tolerated.  She will continue to check her blood glucose twice daily, have fasting goal should be between 80 and 130 mg per DL. - metFORMIN (GLUCOPHAGE) 500 MG tablet; Take 1 tablet (500 mg total) by mouth 2 (two) times daily with a meal.  Dispense: 60 tablet; Refill: 3 - HgB A1c; Future -She was advised to complete [facility] financial assistance application for ambulatory referral to Ophthalmology.  4. Vitamin D deficiency -She completed her vitamin D and we will recheck lab.     Follow-up: Return in about 2 months (around 11/04/2020), or if symptoms worsen or fail to improve.    Tin Engram Jerold Coombe, NP

## 2020-10-05 ENCOUNTER — Telehealth: Payer: Self-pay

## 2020-10-05 NOTE — Telephone Encounter (Signed)
Pt was referred to [facility] for right lower quad hernia. [facility] faxed back stating to refer the pt back when her diabetes is controlled. She is currently not an elective surgical candidate with a hemoglobin A1C of 12.6. When pt is re-referred once she has glycemic control her most recent CT A/P will also need to be powershared with [facility].    Pt has appt with PCP middle of Sept for her A1C. Per Dr. Aleen Campi, we can reorder referral or her PCP can order once A1C is controlled.

## 2020-10-27 ENCOUNTER — Other Ambulatory Visit: Payer: Self-pay

## 2020-10-27 ENCOUNTER — Other Ambulatory Visit: Payer: Self-pay | Admitting: Gerontology

## 2020-10-27 DIAGNOSIS — E1165 Type 2 diabetes mellitus with hyperglycemia: Secondary | ICD-10-CM

## 2020-10-27 DIAGNOSIS — Z8639 Personal history of other endocrine, nutritional and metabolic disease: Secondary | ICD-10-CM

## 2020-10-27 DIAGNOSIS — E785 Hyperlipidemia, unspecified: Secondary | ICD-10-CM

## 2020-10-28 LAB — TSH: TSH: 4.74 u[IU]/mL — ABNORMAL HIGH (ref 0.450–4.500)

## 2020-10-28 LAB — LIPID PANEL
Chol/HDL Ratio: 3.2 ratio (ref 0.0–4.4)
Cholesterol, Total: 168 mg/dL (ref 100–199)
HDL: 53 mg/dL (ref >39)
LDL Chol Calc (NIH): 95 mg/dL (ref 0–99)
Triglycerides: 111 mg/dL (ref 0–149)
VLDL Cholesterol Cal: 20 mg/dL (ref 5–40)

## 2020-10-28 LAB — HEMOGLOBIN A1C
Est. average glucose Bld gHb Est-mCnc: 157 mg/dL
Hgb A1c MFr Bld: 7.1 % — ABNORMAL HIGH (ref 4.8–5.6)

## 2020-11-04 ENCOUNTER — Encounter: Payer: Self-pay | Admitting: Gerontology

## 2020-11-04 ENCOUNTER — Other Ambulatory Visit: Payer: Self-pay

## 2020-11-04 ENCOUNTER — Ambulatory Visit: Payer: Self-pay | Admitting: Gerontology

## 2020-11-04 VITALS — BP 125/83 | HR 77 | Temp 97.3°F | Resp 16 | Ht 59.5 in | Wt 179.6 lb

## 2020-11-04 DIAGNOSIS — Z794 Long term (current) use of insulin: Secondary | ICD-10-CM

## 2020-11-04 DIAGNOSIS — R222 Localized swelling, mass and lump, trunk: Secondary | ICD-10-CM

## 2020-11-04 DIAGNOSIS — Z0000 Encounter for general adult medical examination without abnormal findings: Secondary | ICD-10-CM

## 2020-11-04 DIAGNOSIS — Z8639 Personal history of other endocrine, nutritional and metabolic disease: Secondary | ICD-10-CM

## 2020-11-04 DIAGNOSIS — E1165 Type 2 diabetes mellitus with hyperglycemia: Secondary | ICD-10-CM

## 2020-11-04 MED ORDER — LEVOTHYROXINE SODIUM 50 MCG PO TABS
ORAL_TABLET | Freq: Every day | ORAL | 2 refills | Status: DC
  Filled 2020-11-04: qty 30, fill #0
  Filled 2020-12-08: qty 30, 30d supply, fill #0
  Filled 2021-01-26: qty 30, 30d supply, fill #1

## 2020-11-04 NOTE — Patient Instructions (Signed)
Recuento de carbohidratos para la diabetes mellitus en los adultos Carbohydrate Counting for Diabetes Mellitus, Adult El recuento de carbohidratos es un mtodo para llevar un registro de la cantidad de carbohidratos que se ingieren. La ingesta natural de carbohidratos aumenta la cantidad de azcar (glucosa) en la sangre. El recuento de la cantidad de carbohidratos que se ingieren mejora el control del nivel de glucemia, lo que ayuda a manejar la diabetes. Es importante saber la cantidad de carbohidratos que se pueden ingerir en cada comida sin correr ningn riesgo. Esto es diferente en cada persona. Un nutricionista puede ayudarlo a crear un plan de alimentacin y a calcular la cantidad de carbohidratos que debe ingerir en cada comida y colacin. Qu alimentos contienen carbohidratos? Los siguientes alimentos incluyen carbohidratos: Granos, como panes y cereales. Frijoles secos y productos con soja. Verduras con almidn, como papas, guisantes y maz. Frutas y jugos de frutas. Leche y yogur. Dulces y colaciones, como pasteles, galletas, caramelos, papas fritas de bolsa y refrescos. Cmo se calculan los carbohidratos de los alimentos? Hay dos maneras de calcular los carbohidratos de los alimentos. Puede leer las etiquetas de los alimentos o aprender cules son los tamaos de las porciones estndar de los alimentos. Puede usar cualquiera de los dos mtodos o una combinacin de ambos. Usar la etiqueta de informacin nutricional La lista de informacin nutricional est incluida en las etiquetas de casi todas las bebidas y los alimentos envasados de los [place]. Incluye lo siguiente: El tamao de la porcin. Informacin sobre los nutrientes de cada porcin, incluidos los gramos (g) de carbohidratos por porcin. Para usar la informacin nutricional: Decida cuntas porciones va a comer. Multiplique la cantidad de porciones por el nmero de carbohidratos por porcin. El resultado es la cantidad  total de carbohidratos que comer. Conocer los tamaos de las porciones estndar de los alimentos Cuando coma alimentos que contengan carbohidratos y que no estn envasados o no incluyan la informacin nutricional en la etiqueta, debe medir las porciones para poder calcular la cantidad de carbohidratos. Mida los alimentos que comer con una balanza de alimentos o una taza medidora, si es necesario. Decida cuntas porciones de tamao estndar comer. Multiplique el nmero de porciones por 15. En los alimentos que contienen carbohidratos, una porcin equivale a 15 g de carbohidratos. Por ejemplo, si come 2 tazas o 10 onzas (300 g) de fresas, habr comido 2 porciones y 30 g de carbohidratos (2 porciones x 15 g = 30 g). En el caso de las comidas que contienen mezclas de ms de un alimento, como las sopas y los guisos, debe calcular los carbohidratos de cada alimento que se incluye. La siguiente lista contiene los tamaos de porciones estndar de los alimentos ricos en carbohidratos ms comunes. Cada una de estas porciones tiene aproximadamente 15 g de carbohidratos: 1 rebanada de pan. 1 tortilla de seis pulgadas (15 cm). ? de taza o 2 onzas (53 g) de arroz o pasta cocidos.  taza o 3 onzas (85 g) de lentejas o frijoles cocidos o enlatados, escurridos y enjuagados.  taza o 3 onzas (85 g) de verduras con almidn, como guisantes, maz o zapallo.  taza o 4 onzas (120 g) de cereal caliente.  taza o 3 onzas (85 g) de papas hervidas o en pur, o  o 3 onzas (85 g) de una papa grande al horno.  taza o 4 onzas fluidas (118 ml) de jugo de frutas. 1 taza u 8 onzas fluidas (237 ml) de leche. 1 unidad pequea o   4 onzas (106 g) de manzana.  unidad o 2 onzas (63 g) de una banana mediana. 1 taza o 5 oz (150 g) de fresas. 3 tazas o 1 oz (24 g) de palomitas de maz. Cul sera un ejemplo de recuento de carbohidratos? Para calcular el nmero de carbohidratos de este ejemplo de comida, siga los pasos que se  describen a continuacin. Ejemplo de comida 3 onzas (85 g) de pechugas de pollo. ? de taza o 4 onzas (106 g) de arroz integral.  taza o 3 onzas (85 g) de maz. 1 taza u 8 onzas fluidas (237 ml) de leche. 1 taza o 5 onzas (150 g) de fresas con crema batida sin azcar. Clculo de carbohidratos Identifique los alimentos que contienen carbohidratos: Arroz. Maz. Leche. Fresas. Calcule cuntas porciones come de cada alimento: 2 porciones de arroz. 1 porcin de maz. 1 porcin de leche. 1 porcin de fresas. Multiplique cada nmero de porciones por 15 g: 2 porciones de arroz x 15 g = 30 g. 1 porcin de maz x 15 g = 15 g. 1 porcin de leche x 15 g = 15 g. 1 porcin de fresas x 15 g = 15 g. Sume todas las cantidades para conocer el total de gramos de carbohidratos consumidos: 30 g + 15 g + 15 g + 15 g = 75 g de carbohidratos en total. Consejos para seguir este plan Al ir de compras [place]ore un plan de comidas y luego haga una lista de compras. Compre verduras frescas y congeladas, frutas frescas y congeladas, productos lcteos, huevos, frijoles, lentejas y cereales integrales. Fjese en las etiquetas de los alimentos. Elija los alimentos que tengan ms fibra y menos azcar. Evite los alimentos procesados y los alimentos con azcares agregados. Planificacin de las comidas Trate de consumir la misma cantidad de carbohidratos en cada comida y en cada colacin. Planifique tomar comidas y colaciones regulares y equilibradas. Dnde buscar ms informacin American Diabetes Association (Asociacin [place] de la Diabetes): www.diabetes.org Centers for Disease Control and Prevention (Centros para el Control y la Prevencin de Enfermedades): www.cdc.gov Resumen El recuento de carbohidratos es un mtodo para llevar un registro de la cantidad de carbohidratos que se ingieren. La ingesta natural de carbohidratos aumenta la cantidad de azcar (glucosa) en la sangre. El recuento de la  cantidad de carbohidratos que se ingieren mejora el control del nivel de glucemia, lo que ayuda a manejar la diabetes. Un nutricionista puede ayudarlo a crear un plan de alimentacin y a calcular la cantidad de carbohidratos que debe ingerir en cada comida y colacin. Esta informacin no tiene como fin reemplazar el consejo del mdico. Asegrese de hacerle al mdico cualquier pregunta que tenga. Document Revised: 03/17/2019 Document Reviewed: 03/17/2019 Elsevier Patient Education  2021 [employer] Inc.  

## 2020-11-04 NOTE — Progress Notes (Signed)
Established Patient Office Visit  Subjective:  Patient ID: Kristen Aguirre, female    DOB: Aug 03, 1965  Age: 55 y.o. MRN: [id number]  CC:  Chief Complaint  Patient presents with   Follow-up   Diabetes    Lab drawn on 10/27/20; Blood sugar fasting AM was 140    HPI Kristen Aguirre is a 55 year old female who has history of type 2 diabetes, thyroid disease, hyperlipidemia presents for lab review and medication refill. Her HgbA1c done on 10/27/20 decreased from 12.6% to 7.1%, she checks her blood glucose bid, and it's usually between 130-140 mg/dl, and it was 140 mg/dl this morning. She denies hypo/hyperglycemic symptoms, peripheral neuropathy and performs daily foot checks. She will follow up at [facility] on 11/21/20 for an eye exam. Her TSH decreased from 59.800 to 4.740, she denies heat/cold intolerance and constipation. Her Lipid panel was withing normal limits. She states that she's compliant with her medications and continues to make healthy lifestyle changes. Overall, she states that she's doing well and offers no further complaint.  Past Medical History:  Diagnosis Date   Closed dislocation of left hip ([place]) 11/19/2019   Diabetes mellitus without complication ([place])    Displaced fracture of posterior wall of left acetabulum, initial encounter for closed fracture ([place]) 11/19/2019   Thyroid disease     Past Surgical History:  Procedure Laterality Date   CLOSED REDUCTION FINGER WITH PERCUTANEOUS PINNING Left 12/02/2019   Procedure: CLOSED REDUCTION FINGER WITH PERCUTANEOUS PINNING SMALL FINGER;  Surgeon: Leanora Cover, MD;  Location: [place];  Service: Orthopedics;  Laterality: Left;   EXTREMITY WIRE/PIN REMOVAL Left 01/13/2020   Procedure: REMOVAL K-WIRE/PIN EXTREMITY;  Surgeon: Leanora Cover, MD;  Location: [place];  Service: Orthopedics;  Laterality: Left;   FRACTURE SURGERY     HARDWARE REMOVAL Right 01/13/2020   Procedure: EXTERNAL FIXATOR  RIGHT DISTAL RADIUS   ;  Surgeon: Leanora Cover, MD;  Location: [place];  Service: Orthopedics;  Laterality: Right;   HIP CLOSED REDUCTION Left 11/15/2019   Procedure: CLOSED REDUCTION HIP;  Surgeon: Nicholes Stairs, MD;  Location: [place];  Service: Orthopedics;  Laterality: Left;   I & D EXTREMITY Right 11/15/2019   Procedure: IRRIGATION AND DEBRIDEMENT; EXPLORATION POSSIBLE REPAIR OF TENDON AND NERVE;  Surgeon: Leanora Cover, MD;  Location: [place];  Service: Orthopedics;  Laterality: Right;   OPEN REDUCTION INTERNAL FIXATION (ORIF) DISTAL RADIAL FRACTURE Right 11/15/2019   Procedure: DISTAL RADIAL FRACTURE -RIGHT  EXTERNAL FIXATION; IRRIGATION AND DEBRIDEMENT LEFT SMALL FINGER WITH CLOSURE LACERATION. ;  Surgeon: Leanora Cover, MD;  Location: [place];  Service: Orthopedics;  Laterality: Right;  LEFT SMALL FINGER IRRIGATION AND DEBRIDEMENT WITH CLOSURE LACERATION ADDED.   OPEN REDUCTION INTERNAL FIXATION (ORIF) DISTAL RADIAL FRACTURE Right 12/02/2019   Procedure: OPEN REDUCTION RADIUS WITH PERCUTANEOU PINNING;  Surgeon: Leanora Cover, MD;  Location: [place];  Service: Orthopedics;  Laterality: Right;   OPEN REDUCTION INTERNAL FIXATION ACETABULUM FRACTURE POSTERIOR Left 11/17/2019   Procedure: OPEN REDUCTION INTERNAL FIXATION ACETABULUM FRACTURE POSTERIOR;  Surgeon: Altamese Odessa, MD;  Location: [place];  Service: Orthopedics;  Laterality: Left;    Family History  Problem Relation Age of Onset   Kidney disease Mother    Diabetes Mother    Other Father        unknown medical history   Kidney disease Sister    Diabetes Sister    Diabetes Maternal Aunt    Diabetes Maternal Grandfather  Social History   Socioeconomic History   Marital status: Single    Spouse name: Not on file   Number of children: Not on file   Years of education: Not on file   Highest education level: Not on file  Occupational History   Not on file  Tobacco Use   Smoking status: Never   Smokeless  tobacco: Never  Vaping Use   Vaping Use: Never used  Substance and Sexual Activity   Alcohol use: Yes    Comment: social - once per month   Drug use: Never   Sexual activity: Not on file  Other Topics Concern   Not on file  Social History Narrative   Not on file   Social Determinants of Health   Financial Resource Strain: Not on file  Food Insecurity: No Food Insecurity   Worried About Running Out of Food in the Last Year: Never true   [place] in the Last Year: Never true  Transportation Needs: No Transportation Needs   Lack of Transportation (Medical): No   Lack of Transportation (Non-Medical): No  Physical Activity: Not on file  Stress: Not on file  Social Connections: Not on file  Intimate Partner Violence: Not on file    Outpatient Medications Prior to Visit  Medication Sig Dispense Refill   atorvastatin (LIPITOR) 20 MG tablet Take 1 tablet (20 mg total) by mouth daily. 30 tablet 3   Blood Glucose Monitoring Suppl (BLOOD GLUCOSE MONITOR SYSTEM) w/Device KIT Use up to 4 times daily as directed 1 kit 0   glucose blood (RIGHTEST GS550 BLOOD GLUCOSE) test strip USED AS DIRECTED UP TO 4 TIMES DAILY. 100 each 0   Insulin Glargine (BASAGLAR KWIKPEN) 100 UNIT/ML Inject 14 Units into the skin at bedtime. 15 mL 3   Insulin Pen Needle 32G X 4 MM MISC Use as directed 100 each 3   metFORMIN (GLUCOPHAGE) 500 MG tablet Take 1 tablet (500 mg total) by mouth 2 (two) times daily with a meal. 60 tablet 3   Rightest GL300 Lancets MISC USE AS DIRECTED UP TO 4 TIMES DAILY. 100 each 0   levothyroxine (SYNTHROID) 50 MCG tablet TAKE 1 TABLET (50 MCG TOTAL) BY MOUTH DAILY BEFORE BREAKFAST. 30 tablet 2   Elastic Bandages & Supports (ABDOMINAL BINDER/ELASTIC 2XL) MISC 1 each by Does not apply route daily. 1 each 0   No facility-administered medications prior to visit.    No Known Allergies  ROS Review of Systems  Constitutional: Negative.   Eyes: Negative.   Respiratory: Negative.     Cardiovascular: Negative.   Endocrine: Negative.   Skin: Negative.   Neurological: Negative.      Objective:    Physical Exam HENT:     Head: Normocephalic and atraumatic.     Mouth/Throat:     Mouth: Mucous membranes are moist.  Eyes:     Extraocular Movements: Extraocular movements intact.     Conjunctiva/sclera: Conjunctivae normal.     Pupils: Pupils are equal, round, and reactive to light.  Cardiovascular:     Rate and Rhythm: Normal rate and regular rhythm.     Pulses: Normal pulses.     Heart sounds: Normal heart sounds.  Pulmonary:     Effort: Pulmonary effort is normal.     Breath sounds: Normal breath sounds.  Skin:    General: Skin is warm.  Neurological:     General: No focal deficit present.     Mental Status: She is alert  and oriented to person, place, and time. Mental status is at baseline.    BP 125/83 (BP Location: Left Arm, Patient Position: Sitting, Cuff Size: Large)   Pulse 77   Temp (!) 97.3 F (36.3 C)   Resp 16   Ht 4' 11.5" (1.511 m)   Wt 179 lb 9.6 oz (81.5 kg)   LMP 07/21/1997 (Approximate)   SpO2 94%   BMI 35.67 kg/m  Wt Readings from Last 3 Encounters:  11/04/20 179 lb 9.6 oz (81.5 kg)  10/27/20 180 lb 3.2 oz (81.7 kg)  09/01/20 178 lb 3.2 oz (80.8 kg)   Encouraged weight loss  Health Maintenance Due  Topic Date Due   COVID-19 Vaccine (1) Never done   PNEUMOCOCCAL POLYSACCHARIDE VACCINE AGE 57-64 HIGH RISK  Never done   Pneumococcal Vaccine 34-62 Years old (1 - PCV) Never done   OPHTHALMOLOGY EXAM  Never done   Hepatitis C Screening  Never done   Zoster Vaccines- Shingrix (1 of 2) Never done   PAP SMEAR-Modifier  Never done   COLONOSCOPY (Pts 45-50yrs Insurance coverage will need to be confirmed)  Never done   MAMMOGRAM  05/05/2020   INFLUENZA VACCINE  Never done    There are no preventive care reminders to display for this patient.  Lab Results  Component Value Date   TSH 4.740 (H) 10/27/2020   Lab Results  Component  Value Date   WBC 5.3 07/21/2020   HGB 16.2 (H) 07/21/2020   HCT 48.5 (H) 07/21/2020   MCV 96 07/21/2020   PLT 176 07/21/2020   Lab Results  Component Value Date   NA 136 07/21/2020   K 4.2 07/21/2020   CO2 25 07/21/2020   GLUCOSE 327 (H) 07/21/2020   BUN 10 07/21/2020   CREATININE 0.70 07/21/2020   BILITOT 0.6 07/21/2020   ALKPHOS 133 (H) 07/21/2020   AST 27 07/21/2020   ALT 28 07/21/2020   PROT 7.9 07/21/2020   ALBUMIN 4.5 07/21/2020   CALCIUM 9.1 07/21/2020   ANIONGAP 11 01/13/2020   EGFR 103 07/21/2020   Lab Results  Component Value Date   CHOL 168 10/27/2020   Lab Results  Component Value Date   HDL 53 10/27/2020   Lab Results  Component Value Date   LDLCALC 95 10/27/2020   Lab Results  Component Value Date   TRIG 111 10/27/2020   Lab Results  Component Value Date   CHOLHDL 3.2 10/27/2020   Lab Results  Component Value Date   HGBA1C 7.1 (H) 10/27/2020      Assessment & Plan:    1. History of hypothyroidism -She will continue on current regimen and will recheck TSH in 3 months. - levothyroxine (SYNTHROID) 50 MCG tablet; TAKE 1 TABLET (50 MCG TOTAL) BY MOUTH DAILY BEFORE BREAKFAST.  Dispense: 30 tablet; Refill: 2  2. Type 2 diabetes mellitus with hyperglycemia, with long-term current use of insulin (HCC) - Her HgbA1c improved tremendously to 7.1%, she will continue on current medication, low carb/non concentrated sweet diet and exercise as tolerated. - HgB A1c; Future  3. Abdominal wall lump - She has applied for [facility] Financial assistance, and her HgbA1c is 7.15,  - Ambulatory referral to General Surgery for Abdominal wall lump evaluation.  4. Healthcare maintenance - She was provided with BCCCP flyer, and was encouraged to call and schedule Mammogram and Pap smear appointment.     Follow-up: Return in about 3 months (around 02/01/2021), or if symptoms worsen or fail to improve.  Shagun Wordell Jerold Coombe, NP

## 2020-12-08 ENCOUNTER — Other Ambulatory Visit: Payer: Self-pay

## 2021-01-19 NOTE — Progress Notes (Signed)
Patient pre-screened for BCCCP eligibility due to COVID 19 precautions. Two patient identifiers used for verification that I was speaking to correct patient.  Patient to Present directly to [facility] 01/26/21 for BCCCP screening mammogram.

## 2021-01-25 ENCOUNTER — Ambulatory Visit: Payer: No Typology Code available for payment source | Attending: Oncology

## 2021-01-25 ENCOUNTER — Ambulatory Visit
Admission: RE | Admit: 2021-01-25 | Discharge: 2021-01-25 | Disposition: A | Discharge status: Home / Self Care | Payer: Self-pay | Source: Ambulatory Visit | Attending: Oncology | Admitting: Oncology

## 2021-01-25 ENCOUNTER — Other Ambulatory Visit: Payer: Self-pay

## 2021-01-25 DIAGNOSIS — Z0000 Encounter for general adult medical examination without abnormal findings: Secondary | ICD-10-CM

## 2021-01-26 ENCOUNTER — Other Ambulatory Visit: Payer: Self-pay

## 2021-01-26 VITALS — BP 123/73 | HR 67 | Temp 99.2°F | Ht 60.0 in | Wt 181.9 lb

## 2021-01-26 DIAGNOSIS — E1165 Type 2 diabetes mellitus with hyperglycemia: Secondary | ICD-10-CM

## 2021-01-26 DIAGNOSIS — Z8639 Personal history of other endocrine, nutritional and metabolic disease: Secondary | ICD-10-CM

## 2021-01-26 DIAGNOSIS — L209 Atopic dermatitis, unspecified: Secondary | ICD-10-CM

## 2021-01-26 DIAGNOSIS — Z794 Long term (current) use of insulin: Secondary | ICD-10-CM

## 2021-01-26 MED ORDER — HYDROCORTISONE 1 % EX CREA
1.0000 "application " | TOPICAL_CREAM | Freq: Two times a day (BID) | CUTANEOUS | 0 refills | Status: DC
  Filled 2021-01-26: qty 28, 14d supply, fill #0

## 2021-01-27 LAB — TSH: TSH: 1.93 u[IU]/mL (ref 0.450–4.500)

## 2021-01-27 LAB — HEMOGLOBIN A1C
Est. average glucose Bld gHb Est-mCnc: 203 mg/dL
Hgb A1c MFr Bld: 8.7 % — ABNORMAL HIGH (ref 4.8–5.6)

## 2021-01-27 NOTE — Progress Notes (Signed)
Letter mailed from [facility] to notify of normal mammogram results.  Patient to return in one year for annual screening.  Copy to [facility]. 

## 2021-02-03 ENCOUNTER — Ambulatory Visit: Payer: Self-pay | Admitting: Gerontology

## 2021-02-03 ENCOUNTER — Other Ambulatory Visit: Payer: Self-pay

## 2021-02-03 ENCOUNTER — Encounter: Payer: Self-pay | Admitting: Gerontology

## 2021-02-03 VITALS — BP 113/75 | HR 65 | Temp 98.0°F | Resp 16 | Ht 59.5 in | Wt 179.6 lb

## 2021-02-03 DIAGNOSIS — E785 Hyperlipidemia, unspecified: Secondary | ICD-10-CM

## 2021-02-03 DIAGNOSIS — Z8639 Personal history of other endocrine, nutritional and metabolic disease: Secondary | ICD-10-CM

## 2021-02-03 DIAGNOSIS — E1165 Type 2 diabetes mellitus with hyperglycemia: Secondary | ICD-10-CM

## 2021-02-03 DIAGNOSIS — Z0000 Encounter for general adult medical examination without abnormal findings: Secondary | ICD-10-CM

## 2021-02-03 MED ORDER — ATORVASTATIN CALCIUM 20 MG PO TABS
20.0000 mg | ORAL_TABLET | Freq: Every day | ORAL | 3 refills | Status: DC
  Filled 2021-02-03: qty 30, 30d supply, fill #0
  Filled 2021-03-08: qty 90, 90d supply, fill #0
  Filled 2021-03-08: qty 30, 30d supply, fill #0

## 2021-02-03 MED ORDER — METFORMIN HCL 500 MG PO TABS
1000.0000 mg | ORAL_TABLET | Freq: Two times a day (BID) | ORAL | 2 refills | Status: DC
  Filled 2021-02-03: qty 60, 15d supply, fill #0

## 2021-02-03 MED ORDER — METFORMIN HCL 1000 MG PO TABS
1000.0000 mg | ORAL_TABLET | Freq: Two times a day (BID) | ORAL | 2 refills | Status: DC
  Filled 2021-02-03: qty 60, 30d supply, fill #0

## 2021-02-03 MED ORDER — LEVOTHYROXINE SODIUM 50 MCG PO TABS
ORAL_TABLET | Freq: Every day | ORAL | 4 refills | Status: DC
  Filled 2021-02-03: qty 30, fill #0
  Filled 2021-03-08: qty 30, 30d supply, fill #0
  Filled 2021-03-08: qty 90, 90d supply, fill #0
  Filled 2021-06-27: qty 30, 30d supply, fill #1

## 2021-02-03 MED ORDER — BASAGLAR KWIKPEN 100 UNIT/ML ~~LOC~~ SOPN
17.0000 [IU] | PEN_INJECTOR | Freq: Every day | SUBCUTANEOUS | 3 refills | Status: DC
  Filled 2021-02-03: qty 15, 89d supply, fill #0

## 2021-02-03 NOTE — Patient Instructions (Signed)
Recuento de carbohidratos para la diabetes mellitus en los adultos ?Carbohydrate Counting for Diabetes Mellitus, Adult ?El recuento de carbohidratos es un m?todo para llevar un registro de la cantidad de carbohidratos que se ingieren. La ingesta de carbohidratos aumenta la cantidad de az?car (glucosa) en la sangre. El recuento de la cantidad de carbohidratos que ingiere mejora el control de su glucemia. Esto, a su vez, le ayuda a controlar su diabetes. ?Los carbohidratos se miden en gramos (g) por porci?n. Es importante saber la cantidad de carbohidratos (en gramos o por tama?o de porci?n) que se puede ingerir en cada comida. Esto es diferente en cada persona. Un nutricionista puede ayudarlo a crear un plan de alimentaci?n y a calcular la cantidad de carbohidratos que debe ingerir en cada comida y colaci?n. ??Qu? alimentos contienen carbohidratos? ?Los siguientes alimentos incluyen carbohidratos: ?Granos, como panes y cereales. ?Frijoles secos y productos con soja. ?Verduras con almid?n, como papas, guisantes y ma?z. ?Frutas y jugos de frutas. ?Leche y yogur. ?Dulces y colaciones, como pasteles, galletas, caramelos, papas fritas de bolsa y refrescos. ??C?mo se calculan los carbohidratos de los alimentos? ?Hay dos maneras de calcular los carbohidratos de los alimentos. Puede leer las etiquetas de los alimentos o aprender cu?les son los tama?os de las porciones est?ndar de los alimentos. Puede usar cualquiera de estos m?todos o una combinaci?n de ambos. ?Usar la etiqueta de informaci?n nutricional ?La lista de Informaci?n nutricional est? incluida en las etiquetas de casi todas las bebidas y los alimentos envasados de los [place]. Esto incluye lo siguiente: ?El tama?o de la porci?n. ?Informaci?n sobre los nutrientes de cada porci?n, incluidos los gramos de carbohidratos por porci?n. ?Para usar la Informaci?n nutricional, decida cu?ntas porciones tomar?. Luego, multiplique el n?mero de porciones por la cantidad  de carbohidratos por porci?n. El n?mero resultante es la cantidad total de carbohidratos que comer?. ?Conocer los tama?os de las porciones est?ndar de los alimentos ?Cuando coma alimentos que contengan carbohidratos y que no est?n envasados o no incluyan la informaci?n nutricional en la etiqueta, debe medir las porciones para poder calcular los gramos de carbohidratos. ?Mida los alimentos que comer? con una balanza de alimentos o una taza medidora, si es necesario. ?Decida cu?ntas porciones de tama?o est?ndar comer?. ?Multiplique el n?mero de porciones por 15. En los alimentos que contienen carbohidratos, una porci?n equivale a 15 g de carbohidratos. ?Por ejemplo, si come 2 tazas o 10 onzas (300 g) de fresas, habr? comido 2 porciones y 30 g de carbohidratos (2 porciones x 15 g = 30 g). ?En el caso de las comidas que contienen mezclas de m?s de un alimento, como las sopas y los guisos, debe calcular los carbohidratos de cada alimento que se incluye. ?La siguiente lista contiene los tama?os de porciones est?ndar de los alimentos ricos en carbohidratos m?s comunes. Cada una de estas porciones tiene aproximadamente 15 g de carbohidratos: ?1 rebanada de pan. ?1 tortilla de seis pulgadas (15 cm). ?? de taza o 2 onzas (53 g) de arroz o pasta cocidos. ?? taza o 3 onzas (85 g) de lentejas o frijoles cocidos o enlatados, escurridos y enjuagados. ?? taza o 3 onzas (85 g) de verduras con almid?n, como guisantes, ma?z o zapallo. ?? taza o 4 onzas (120 g) de cereal caliente. ?? taza o 3 onzas (85 g) de papas hervidas o en pur?, o ? o 3 onzas (85 g) de una papa grande al horno. ?? taza o 4 onzas fluidas (118 ml) de jugo de frutas. ?1 taza u   8 onzas fluidas (237 ml) de leche. ?1 unidad peque?a o 4 onzas (106 g) de manzana. ?? unidad o 2 onzas (63 g) de una banana mediana. ?1 taza o 5 oz (150 g) de fresas. ?3 tazas o 1 oz (28.3 g) de palomitas de ma?z. ??Cu?l ser?a un ejemplo de recuento de carbohidratos? ?Para calcular los gramos  de carbohidratos de este ejemplo de comida, siga los pasos que se describen a continuaci?n. ?Ejemplo de comida ?3 onzas (85 g) de pechugas de pollo. ?? de taza o 4 onzas (106 g) de arroz integral. ?? taza o 3 onzas (85 g) de ma?z. ?1 taza u 8 onzas fluidas (237 ml) de leche. ?1 taza o 5 onzas (150 g) de fresas con crema batida sin az?car. ?C?lculo de carbohidratos ?Identifique los alimentos que contienen carbohidratos: ?Arroz. ?Ma?z. ?Leche. ?Fresas. ?Calcule cu?ntas porciones come de cada alimento: ?2 porciones de arroz. ?1 porci?n de ma?z. ?1 porci?n de leche. ?1 porci?n de fresas. ?Multiplique cada n?mero de porciones por 15 g: ?2 porciones de arroz x 15 g = 30 g. ?1 porci?n de ma?z x 15 g = 15 g. ?1 porci?n de leche x 15 g = 15 g. ?1 porci?n de fresas x 15 g = 15 g. ?Sume todas las cantidades para conocer el total de gramos de carbohidratos consumidos: ?30 g + 15 g + 15 g + 15 g = 75 g de carbohidratos en total. ?Consejos para seguir este plan ?Al ir de compras ?Elabore un plan de comidas y luego haga una lista de compras. ?Compre verduras frescas y congeladas, frutas frescas y congeladas, productos l?cteos, huevos, frijoles, lentejas y cereales integrales. ?F?jese en las etiquetas de los alimentos. Elija los alimentos que tengan m?s fibra y menos az?car. ?Evite los alimentos procesados y los alimentos con az?cares agregados. ?Planificaci?n de las comidas ?Trate de consumir la misma cantidad de gramos de carbohidratos en cada comida y en cada colaci?n. ?Planifique tomar comidas y colaciones regulares y equilibradas. ?D?nde buscar m?s informaci?n ?American Diabetes Association (Asociaci?n [place] de la Diabetes): diabetes.org ?Centers for Disease Control and Prevention (Centros para el Control y la Prevenci?n de Enfermedades): cdc.gov ?Academy of Nutrition and Dietetics (Academia de Nutrici?n y Diet?tica): eatright.org ?Association of Diabetes Care & Education Specialists (Asociaci?n de Especialistas en  Atenci?n y Educaci?n sobre la Diabetes): diabeteseducator.org ?Resumen ?El recuento de carbohidratos es un m?todo para llevar un registro de la cantidad de carbohidratos que se ingieren. ?La ingesta de carbohidratos aumenta la cantidad de az?car (glucosa) en la sangre. ?El recuento de la cantidad de carbohidratos que ingiere mejora el control de su glucemia. Esto le ayuda a controlar su diabetes. ?Un nutricionista puede ayudarlo a crear un plan de alimentaci?n y a calcular la cantidad de carbohidratos que debe ingerir en cada comida y colaci?n. ?Esta informaci?n no tiene como fin reemplazar el consejo del m?dico. Aseg?rese de hacerle al m?dico cualquier pregunta que tenga. ?Document Revised: 10/28/2019 Document Reviewed: 10/28/2019 ?Elsevier Patient Education ? 2022 Elsevier Inc. ? ?

## 2021-02-03 NOTE — Progress Notes (Signed)
Patient triaged with help of interpreter services, Debby Bud id# [phone].

## 2021-02-03 NOTE — Progress Notes (Signed)
Established Patient Office Visit  Subjective:  Patient ID: Kristen Aguirre, female    DOB: 08/14/1965  Age: 55 y.o. MRN: [id number]  CC:  Chief Complaint  Patient presents with   Follow-up    Labs drawn 01/26/21   Diabetes    HPI Kristen Aguirre  is a 55 year old female who has history of type 2 diabetes, thyroid disease, hyperlipidemia presents for lab review and medication refill . [place] interpreter was used during visit. HgbA1c increases from 7.1% to 8.7%, checks bid, it was 206 mg/dl. She states that she is compliant with her medications, denies side effects and continues to work on her diet. She denies hypo/hyperglycemic symptoms, peripheral neuropathy and performs daily foot checks. Her TSH was normal at 1.930 and she denies any symptoms. Her [employer] application has been approved, she will follow up at the [facility] center on 02/18/21. Overall, she states that she's doing well and offers no further complaint.  Past Medical History:  Diagnosis Date   Closed dislocation of left hip ([place]) 11/19/2019   Diabetes mellitus without complication ([place])    Displaced fracture of posterior wall of left acetabulum, initial encounter for closed fracture ([place]) 11/19/2019   Thyroid disease     Past Surgical History:  Procedure Laterality Date   CLOSED REDUCTION FINGER WITH PERCUTANEOUS PINNING Left 12/02/2019   Procedure: CLOSED REDUCTION FINGER WITH PERCUTANEOUS PINNING SMALL FINGER;  Surgeon: Leanora Cover, MD;  Location: [place];  Service: Orthopedics;  Laterality: Left;   EXTREMITY WIRE/PIN REMOVAL Left 01/13/2020   Procedure: REMOVAL K-WIRE/PIN EXTREMITY;  Surgeon: Leanora Cover, MD;  Location: [place];  Service: Orthopedics;  Laterality: Left;   FRACTURE SURGERY     HARDWARE REMOVAL Right 01/13/2020   Procedure: EXTERNAL FIXATOR RIGHT DISTAL RADIUS   ;  Surgeon: Leanora Cover, MD;  Location: [place];  Service: Orthopedics;   Laterality: Right;   HIP CLOSED REDUCTION Left 11/15/2019   Procedure: CLOSED REDUCTION HIP;  Surgeon: Nicholes Stairs, MD;  Location: [place];  Service: Orthopedics;  Laterality: Left;   I & D EXTREMITY Right 11/15/2019   Procedure: IRRIGATION AND DEBRIDEMENT; EXPLORATION POSSIBLE REPAIR OF TENDON AND NERVE;  Surgeon: Leanora Cover, MD;  Location: [place];  Service: Orthopedics;  Laterality: Right;   OPEN REDUCTION INTERNAL FIXATION (ORIF) DISTAL RADIAL FRACTURE Right 11/15/2019   Procedure: DISTAL RADIAL FRACTURE -RIGHT  EXTERNAL FIXATION; IRRIGATION AND DEBRIDEMENT LEFT SMALL FINGER WITH CLOSURE LACERATION. ;  Surgeon: Leanora Cover, MD;  Location: [place];  Service: Orthopedics;  Laterality: Right;  LEFT SMALL FINGER IRRIGATION AND DEBRIDEMENT WITH CLOSURE LACERATION ADDED.   OPEN REDUCTION INTERNAL FIXATION (ORIF) DISTAL RADIAL FRACTURE Right 12/02/2019   Procedure: OPEN REDUCTION RADIUS WITH PERCUTANEOU PINNING;  Surgeon: Leanora Cover, MD;  Location: [place];  Service: Orthopedics;  Laterality: Right;   OPEN REDUCTION INTERNAL FIXATION ACETABULUM FRACTURE POSTERIOR Left 11/17/2019   Procedure: OPEN REDUCTION INTERNAL FIXATION ACETABULUM FRACTURE POSTERIOR;  Surgeon: Altamese Loraine, MD;  Location: [place];  Service: Orthopedics;  Laterality: Left;    Family History  Problem Relation Age of Onset   Kidney disease Mother    Diabetes Mother    Other Father        unknown medical history   Kidney disease Sister    Diabetes Sister    Diabetes Maternal Aunt    Diabetes Maternal Grandfather    Breast cancer Neg Hx     Social History   Socioeconomic History  Marital status: Single    Spouse name: Not on file   Number of children: Not on file   Years of education: Not on file   Highest education level: Not on file  Occupational History   Not on file  Tobacco Use   Smoking status: Never   Smokeless tobacco: Never  Vaping Use   Vaping Use: Never used  Substance and Sexual Activity    Alcohol use: Yes    Comment: social - once per month   Drug use: Never   Sexual activity: Not on file  Other Topics Concern   Not on file  Social History Narrative   Not on file   Social Determinants of Health   Financial Resource Strain: Not on file  Food Insecurity: No Food Insecurity   Worried About Running Out of Food in the Last Year: Never true   [place] in the Last Year: Never true  Transportation Needs: No Transportation Needs   Lack of Transportation (Medical): No   Lack of Transportation (Non-Medical): No  Physical Activity: Not on file  Stress: Not on file  Social Connections: Not on file  Intimate Partner Violence: Not on file    Outpatient Medications Prior to Visit  Medication Sig Dispense Refill   acetaminophen (TYLENOL) 325 MG tablet Take 2 tablets by mouth every 6 (six) hours as needed.     Blood Glucose Monitoring Suppl (BLOOD GLUCOSE MONITOR SYSTEM) w/Device KIT Use up to 4 times daily as directed 1 kit 0   Elastic Bandages & Supports (ABDOMINAL BINDER/ELASTIC 2XL) MISC 1 each by Does not apply route daily. 1 each 0   glucose blood (RIGHTEST GS550 BLOOD GLUCOSE) test strip USED AS DIRECTED UP TO 4 TIMES DAILY. 100 each 0   hydrocortisone cream 1 % Apply 1 application topically to the affect area(s) 2 (two) times daily. 28 g 0   Insulin Pen Needle 32G X 4 MM MISC Use as directed 100 each 3   Rightest GL300 Lancets MISC USE AS DIRECTED UP TO 4 TIMES DAILY. 100 each 0   atorvastatin (LIPITOR) 20 MG tablet Take 1 tablet (20 mg total) by mouth daily. 30 tablet 3   Insulin Glargine (BASAGLAR KWIKPEN) 100 UNIT/ML Inject 14 Units into the skin at bedtime. 15 mL 3   levothyroxine (SYNTHROID) 50 MCG tablet TAKE 1 TABLET (50 MCG TOTAL) BY MOUTH DAILY BEFORE BREAKFAST. 30 tablet 2   metFORMIN (GLUCOPHAGE) 500 MG tablet Take 1 tablet (500 mg total) by mouth 2 (two) times daily with a meal. 60 tablet 3   No facility-administered medications prior to visit.    No  Known Allergies  ROS Review of Systems  Constitutional: Negative.   Eyes: Negative.   Respiratory: Negative.    Cardiovascular: Negative.   Endocrine: Negative.   Neurological: Negative.   Psychiatric/Behavioral: Negative.       Objective:    Physical Exam HENT:     Head: Normocephalic and atraumatic.     Mouth/Throat:     Mouth: Mucous membranes are moist.  Eyes:     Extraocular Movements: Extraocular movements intact.     Conjunctiva/sclera: Conjunctivae normal.     Pupils: Pupils are equal, round, and reactive to light.  Cardiovascular:     Rate and Rhythm: Normal rate and regular rhythm.     Pulses: Normal pulses.     Heart sounds: Normal heart sounds.  Pulmonary:     Effort: Pulmonary effort is normal.  Breath sounds: Normal breath sounds.  Skin:    General: Skin is warm.  Neurological:     General: No focal deficit present.     Mental Status: She is alert and oriented to person, place, and time. Mental status is at baseline.  Psychiatric:        Mood and Affect: Mood normal.        Behavior: Behavior normal.        Thought Content: Thought content normal.        Judgment: Judgment normal.    BP 113/75 (BP Location: Right Arm, Patient Position: Sitting, Cuff Size: Large)    Pulse 65    Temp 98 F (36.7 C) (Oral)    Resp 16    Ht 4' 11.5" (1.511 m)    Wt 179 lb 9.6 oz (81.5 kg)    LMP 07/21/1997 (Approximate)    SpO2 97%    BMI 35.67 kg/m  Wt Readings from Last 3 Encounters:  02/03/21 179 lb 9.6 oz (81.5 kg)  01/26/21 181 lb 14.4 oz (82.5 kg)  11/04/20 179 lb 9.6 oz (81.5 kg)   Encouraged weight loss  Health Maintenance Due  Topic Date Due   COVID-19 Vaccine (1) Never done   Pneumococcal Vaccine 43-27 Years old (1 - PCV) Never done   OPHTHALMOLOGY EXAM  Never done   Hepatitis C Screening  Never done   Zoster Vaccines- Shingrix (1 of 2) Never done   PAP SMEAR-Modifier  Never done   COLONOSCOPY (Pts 45-49yrs Insurance coverage will need to be  confirmed)  Never done    There are no preventive care reminders to display for this patient.  Lab Results  Component Value Date   TSH 1.930 01/26/2021   Lab Results  Component Value Date   WBC 5.3 07/21/2020   HGB 16.2 (H) 07/21/2020   HCT 48.5 (H) 07/21/2020   MCV 96 07/21/2020   PLT 176 07/21/2020   Lab Results  Component Value Date   NA 136 07/21/2020   K 4.2 07/21/2020   CO2 25 07/21/2020   GLUCOSE 327 (H) 07/21/2020   BUN 10 07/21/2020   CREATININE 0.70 07/21/2020   BILITOT 0.6 07/21/2020   ALKPHOS 133 (H) 07/21/2020   AST 27 07/21/2020   ALT 28 07/21/2020   PROT 7.9 07/21/2020   ALBUMIN 4.5 07/21/2020   CALCIUM 9.1 07/21/2020   ANIONGAP 11 01/13/2020   EGFR 103 07/21/2020   Lab Results  Component Value Date   CHOL 168 10/27/2020   Lab Results  Component Value Date   HDL 53 10/27/2020   Lab Results  Component Value Date   LDLCALC 95 10/27/2020   Lab Results  Component Value Date   TRIG 111 10/27/2020   Lab Results  Component Value Date   CHOLHDL 3.2 10/27/2020   Lab Results  Component Value Date   HGBA1C 8.7 (H) 01/26/2021      Assessment & Plan:   1. Type 2 diabetes mellitus with hyperglycemia, with long-term current use of insulin (HCC) -Her hemoglobin A1c was 8.7% and her goal should be less than 7%.  Her metformin was increased to 1000 mg twice daily and insulin glargine increased to 17 units at bedtime.  She was advised to check her blood glucose twice daily, record and bring log to follow-up appointment.  She was encouraged to continue on low carbohydrate/no concentrated sweet diet and exercise as tolerated. - Insulin Glargine (BASAGLAR KWIKPEN) 100 UNIT/ML; Inject 17 Units into the skin  once daily at bedtime.  Dispense: 15 mL; Refill: 3 - POCT HgB A1C; Future - metFORMIN (GLUCOPHAGE) 1000 MG tablet; Take 1 tablet (1,000 mg total) by mouth 2 (two) times daily with a meal.  Dispense: 60 tablet; Refill: 2  2. Elevated lipids -The 10-year  ASCVD risk score (Arnett DK, et al., 2019) is: 2.1%   Values used to calculate the score:     Age: 47 years     Sex: Female     Is Non-Hispanic African American: No     Diabetic: Yes     Tobacco smoker: No     Systolic Blood Pressure: 045 mmHg     Is BP treated: No     HDL Cholesterol: 53 mg/dL     Total Cholesterol: 168 mg/dL She will continue on current medication, low-fat low-cholesterol diet and exercise as tolerated. - atorvastatin (LIPITOR) 20 MG tablet; Take 1 tablet (20 mg total) by mouth daily.  Dispense: 30 tablet; Refill: 3  3. History of hypothyroidism -Her TSH is euthyroid and she will continue on the same medication regiment. - levothyroxine (SYNTHROID) 50 MCG tablet; TAKE 1 TABLET (50 MCG TOTAL) BY MOUTH DAILY BEFORE BREAKFAST.  Dispense: 30 tablet; Refill: 4  4. Health care maintenance  - Ambulatory referral to Gastroenterology for colonoscopy screening - Flu Vaccine QUAD 6+ mos PF IM (Fluarix Quad PF) was administered.     Follow-up: Return in about 3 months (around 04/27/2021), or if symptoms worsen or fail to improve.    Yaniah Thiemann Jerold Coombe, NP

## 2021-02-04 ENCOUNTER — Other Ambulatory Visit: Payer: Self-pay

## 2021-03-01 ENCOUNTER — Other Ambulatory Visit: Payer: Self-pay | Admitting: Emergency Medicine

## 2021-03-01 DIAGNOSIS — R222 Localized swelling, mass and lump, trunk: Secondary | ICD-10-CM

## 2021-03-08 ENCOUNTER — Other Ambulatory Visit: Payer: Self-pay

## 2021-04-11 ENCOUNTER — Other Ambulatory Visit: Payer: Self-pay

## 2021-04-28 ENCOUNTER — Encounter: Payer: Self-pay | Admitting: Gerontology

## 2021-04-28 ENCOUNTER — Other Ambulatory Visit: Payer: Self-pay

## 2021-04-28 ENCOUNTER — Ambulatory Visit: Payer: Self-pay | Admitting: Gerontology

## 2021-04-28 VITALS — BP 136/78 | HR 76 | Temp 97.8°F | Resp 16 | Wt 178.2 lb

## 2021-04-28 DIAGNOSIS — Z794 Long term (current) use of insulin: Secondary | ICD-10-CM

## 2021-04-28 DIAGNOSIS — E785 Hyperlipidemia, unspecified: Secondary | ICD-10-CM

## 2021-04-28 LAB — POCT GLYCOSYLATED HEMOGLOBIN (HGB A1C): Hemoglobin A1C: 9.8 % — AB (ref 4.0–5.6)

## 2021-04-28 LAB — GLUCOSE, POCT (MANUAL RESULT ENTRY): POC Glucose: 260 mg/dl — AB (ref 70–99)

## 2021-04-28 MED ORDER — BASAGLAR KWIKPEN 100 UNIT/ML ~~LOC~~ SOPN
20.0000 [IU] | PEN_INJECTOR | Freq: Every day | SUBCUTANEOUS | 3 refills | Status: DC
  Filled 2021-04-28: qty 15, 75d supply, fill #0

## 2021-04-28 MED ORDER — INSULIN PEN NEEDLE 32G X 4 MM MISC
1.0000 "pen " | Freq: Every day | 3 refills | Status: DC
  Filled 2021-04-28: qty 100, 100d supply, fill #0

## 2021-04-28 MED ORDER — ATORVASTATIN CALCIUM 20 MG PO TABS
20.0000 mg | ORAL_TABLET | Freq: Every day | ORAL | 3 refills | Status: DC
Start: 2021-04-28 — End: 2021-11-02
  Filled 2021-04-28 – 2021-08-03 (×4): qty 30, 30d supply, fill #0
  Filled 2021-09-01: qty 30, 30d supply, fill #1
  Filled 2021-10-11: qty 30, 30d supply, fill #2

## 2021-04-28 MED ORDER — METFORMIN HCL 1000 MG PO TABS
1000.0000 mg | ORAL_TABLET | Freq: Two times a day (BID) | ORAL | 2 refills | Status: DC
  Filled 2021-04-28: qty 180, 90d supply, fill #0

## 2021-04-28 NOTE — Progress Notes (Signed)
° °Established Patient Office Visit ° °Subjective:  °Patient ID: Kristen Aguirre, female    DOB: 12/17/1965  Age: 56 y.o. MRN: [id number] ° °CC:  °Chief Complaint  °Patient presents with  ° Follow-up  ° Diabetes  ° ° °HPI °Kristen Aguirre  is a 56-year-old female who has history of type 2 diabetes, thyroid disease, hyperlipidemia presents for lab review and medication refill . Her HgbA1c done during visit increased from 8.7% to 9.8% and her blood glucose was 260 mg/dl. She checks her blood glucose daily, denies hypo/hyperglycemic symptoms, peripheral neuropathy and performs daily foot checks. She states that she is compliant with her medications and continues to make healthy lifestyle changes. She was seen at [facility] on 02/18/21  and was diagnosed with Hyperopia with presbyopia and wears prescription glasses. She had CT Abdomen at [facility] done on 03/08/21 for abdominal wall lump and it shows Large broad-based right lateral abdominal wall hernia containing small and large bowel with no imaging evidence of bowel entrapment and she will follow up with [facility] on 06/23/21. Overall, she states that she's doing well and offers no further complaint. ° ° °Past Medical History:  °Diagnosis Date  ° Closed dislocation of left hip (HCC) 11/19/2019  ° Diabetes mellitus without complication (HCC)   ° Displaced fracture of posterior wall of left acetabulum, initial encounter for closed fracture (HCC) 11/19/2019  ° Thyroid disease   ° ° °Past Surgical History:  °Procedure Laterality Date  ° CLOSED REDUCTION FINGER WITH PERCUTANEOUS PINNING Left 12/02/2019  ° Procedure: CLOSED REDUCTION FINGER WITH PERCUTANEOUS PINNING SMALL FINGER;  Surgeon: Kuzma, Kevin, MD;  [facility];  Service: Orthopedics;  Laterality: Left;  ° EXTREMITY WIRE/PIN REMOVAL Left 01/13/2020  ° Procedure: REMOVAL K-WIRE/PIN EXTREMITY;  Surgeon: Kuzma, Kevin, MD;  Location: [facility];  Service: Orthopedics;   Laterality: Left;  ° FRACTURE SURGERY    ° HARDWARE REMOVAL Right 01/13/2020  ° Procedure: EXTERNAL FIXATOR RIGHT DISTAL RADIUS   ;  Surgeon: Kuzma, Kevin, MD;  Location: [facility];  Service: Orthopedics;  Laterality: Right;  ° HIP CLOSED REDUCTION Left 11/15/2019  ° Procedure: CLOSED REDUCTION HIP;  Surgeon: Rogers, Jason Patrick, MD;  [facility];  Service: Orthopedics;  Laterality: Left;  ° I & D EXTREMITY Right 11/15/2019  ° Procedure: IRRIGATION AND DEBRIDEMENT; EXPLORATION POSSIBLE REPAIR OF TENDON AND NERVE;  Surgeon: Kuzma, Kevin, MD;  [facility];  Service: Orthopedics;  Laterality: Right;  ° OPEN REDUCTION INTERNAL FIXATION (ORIF) DISTAL RADIAL FRACTURE Right 11/15/2019  ° Procedure: DISTAL RADIAL FRACTURE -RIGHT  EXTERNAL FIXATION; IRRIGATION AND DEBRIDEMENT LEFT SMALL FINGER WITH CLOSURE LACERATION. ;  Surgeon: Kuzma, Kevin, MD;  [facility];  Service: Orthopedics;  Laterality: Right;  LEFT SMALL FINGER IRRIGATION AND DEBRIDEMENT WITH CLOSURE LACERATION ADDED.  ° OPEN REDUCTION INTERNAL FIXATION (ORIF) DISTAL RADIAL FRACTURE Right 12/02/2019  ° Procedure: OPEN REDUCTION RADIUS WITH PERCUTANEOU PINNING;  Surgeon: Kuzma, Kevin, MD;  [facility];  Service: Orthopedics;  Laterality: Right;  ° OPEN REDUCTION INTERNAL FIXATION ACETABULUM FRACTURE POSTERIOR Left 11/17/2019  ° Procedure: OPEN REDUCTION INTERNAL FIXATION ACETABULUM FRACTURE POSTERIOR;  Surgeon: Handy, Michael, MD;  [facility];  Service: Orthopedics;  Laterality: Left;  ° ° °Family History  °Problem Relation Age of Onset  ° Kidney disease Mother   ° Diabetes Mother   ° Other Father   °     unknown medical history  ° Kidney disease Sister   °   Diabetes Sister   ° Diabetes Maternal Aunt   ° Diabetes Maternal Grandfather   ° Breast cancer Neg Hx   ° ° °Social History  ° °Socioeconomic History  ° Marital status: Single  °  Spouse name: Not on file  ° Number of children: Not on file  ° Years of education: Not on file  °  Highest education level: Not on file  °Occupational History  ° Not on file  °Tobacco Use  ° Smoking status: Never  ° Smokeless tobacco: Never  °Vaping Use  ° Vaping Use: Never used  °Substance and Sexual Activity  ° Alcohol use: Yes  °  Comment: social - once per month  ° Drug use: Never  ° Sexual activity: Not on file  °Other Topics Concern  ° Not on file  °Social History Narrative  ° Not on file  ° °Social Determinants of Health  ° °Financial Resource Strain: Not on file  °Food Insecurity: No Food Insecurity  ° Worried About Running Out of Food in the Last Year: Never true  ° Ran Out of Food in the Last Year: Never true  °Transportation Needs: No Transportation Needs  ° Lack of Transportation (Medical): No  ° Lack of Transportation (Non-Medical): No  °Physical Activity: Not on file  °Stress: Not on file  °Social Connections: Not on file  °Intimate Partner Violence: Not on file  ° ° °Outpatient Medications Prior to Visit  °Medication Sig Dispense Refill  ° Blood Glucose Monitoring Suppl (BLOOD GLUCOSE MONITOR SYSTEM) w/Device KIT Use up to 4 times daily as directed 1 kit 0  ° Elastic Bandages & Supports (ABDOMINAL BINDER/ELASTIC 2XL) MISC 1 each by Does not apply route daily. 1 each 0  ° glucose blood (RIGHTEST GS550 BLOOD GLUCOSE) test strip USED AS DIRECTED UP TO 4 TIMES DAILY. 100 each 0  ° hydrocortisone cream 1 % Apply 1 application topically to the affect area(s) 2 (two) times daily. 28 g 0  ° ibuprofen (ADVIL) 200 MG tablet Take 200 mg by mouth every 6 (six) hours as needed.    ° levothyroxine (SYNTHROID) 50 MCG tablet TAKE 1 TABLET (50 MCG TOTAL) BY MOUTH DAILY BEFORE BREAKFAST. 30 tablet 4  ° Rightest GL300 Lancets MISC USE AS DIRECTED UP TO 4 TIMES DAILY. 100 each 0  ° atorvastatin (LIPITOR) 20 MG tablet Take 1 tablet (20 mg total) by mouth daily. 30 tablet 3  ° Insulin Glargine (BASAGLAR KWIKPEN) 100 UNIT/ML Inject 17 Units into the skin once daily at bedtime. 15 mL 3  ° Insulin Pen Needle 32G X 4 MM  MISC Use as directed 100 each 3  ° metFORMIN (GLUCOPHAGE) 1000 MG tablet Take 1 tablet (1,000 mg total) by mouth 2 (two) times daily with a meal. 60 tablet 2  ° acetaminophen (TYLENOL) 325 MG tablet Take 2 tablets by mouth every 6 (six) hours as needed. (Patient not taking: Reported on 04/28/2021)    ° °No facility-administered medications prior to visit.  ° ° °No Known Allergies ° °ROS °Review of Systems  °Constitutional: Negative.   °Eyes: Negative.   °Respiratory: Negative.    °Cardiovascular: Negative.   °Gastrointestinal:   °     Hernia to right abdominal quadrant  °Endocrine: Negative.   °Skin: Negative.   °Neurological: Negative.   ° °  °Objective:  °  °Physical Exam °HENT:  °   Head: Normocephalic and atraumatic.  °   Mouth/Throat:  °   Mouth: Mucous membranes are moist.  °Eyes:  °     Extraocular Movements: Extraocular movements intact.  °   Conjunctiva/sclera: Conjunctivae normal.  °   Pupils: Pupils are equal, round, and reactive to light.  °Cardiovascular:  °   Rate and Rhythm: Normal rate and regular rhythm.  °   Pulses: Normal pulses.  °   Heart sounds: Normal heart sounds.  °Pulmonary:  °   Effort: Pulmonary effort is normal.  °   Breath sounds: Normal breath sounds.  °Abdominal:  °   Hernia: A hernia (right lower abdominal quadrant) is present.  °Skin: °   General: Skin is warm.  °Neurological:  °   General: No focal deficit present.  °   Mental Status: She is alert and oriented to person, place, and time. Mental status is at baseline.  °Psychiatric:     °   Mood and Affect: Mood normal.     °   Behavior: Behavior normal.     °   Thought Content: Thought content normal.     °   Judgment: Judgment normal.  ° ° °BP 136/78 (BP Location: Left Arm, Patient Position: Sitting, Cuff Size: Large)    Pulse 76    Temp 97.8 °F (36.6 °C) (Oral)    Resp 16    Wt 178 lb 3.2 oz (80.8 kg)    LMP 07/21/1997 (Approximate)    SpO2 96%    BMI 35.39 kg/m²  °Wt Readings from Last 3 Encounters:  °04/28/21 178 lb 3.2 oz (80.8  kg)  °02/03/21 179 lb 9.6 oz (81.5 kg)  °01/26/21 181 lb 14.4 oz (82.5 kg)  ° °Encouraged weight loss ° °Health Maintenance Due  °Topic Date Due  ° COVID-19 Vaccine (1) Never done  ° Hepatitis C Screening  Never done  ° Zoster Vaccines- Shingrix (1 of 2) Never done  ° PAP SMEAR-Modifier  Never done  ° COLONOSCOPY (Pts 45-49yrs Insurance coverage will need to be confirmed)  Never done  ° ° °There are no preventive care reminders to display for this patient. ° °Lab Results  °Component Value Date  ° TSH 1.930 01/26/2021  ° °Lab Results  °Component Value Date  ° WBC 5.3 07/21/2020  ° HGB 16.2 (H) 07/21/2020  ° HCT 48.5 (H) 07/21/2020  ° MCV 96 07/21/2020  ° PLT 176 07/21/2020  ° °Lab Results  °Component Value Date  ° NA 136 07/21/2020  ° K 4.2 07/21/2020  ° CO2 25 07/21/2020  ° GLUCOSE 327 (H) 07/21/2020  ° BUN 10 07/21/2020  ° CREATININE 0.70 07/21/2020  ° BILITOT 0.6 07/21/2020  ° ALKPHOS 133 (H) 07/21/2020  ° AST 27 07/21/2020  ° ALT 28 07/21/2020  ° PROT 7.9 07/21/2020  ° ALBUMIN 4.5 07/21/2020  ° CALCIUM 9.1 07/21/2020  ° ANIONGAP 11 01/13/2020  ° EGFR 103 07/21/2020  ° °Lab Results  °Component Value Date  ° CHOL 168 10/27/2020  ° °Lab Results  °Component Value Date  ° HDL 53 10/27/2020  ° °Lab Results  °Component Value Date  ° LDLCALC 95 10/27/2020  ° °Lab Results  °Component Value Date  ° TRIG 111 10/27/2020  ° °Lab Results  °Component Value Date  ° CHOLHDL 3.2 10/27/2020  ° °Lab Results  °Component Value Date  ° HGBA1C 9.8 (A) 04/28/2021  ° ° °  °Assessment & Plan:  ° ° ° °1. Type 2 diabetes mellitus with hyperglycemia, with long-term current use of insulin (HCC) °- Her HgbA1c was 9.8% and her goal should be less than 7%. She was encouraged to check her   blood glucose bid, record and bring log to follow up appointment. Her goal fasting reading should be between 80-130 mg/dl. Her Basaglar was increased to 20 units at qhs. She was encouraged to continue on low carb/non concentrated sweet diet and exercise as  tolerated. - POCT HgB A1C - POCT Glucose (CBG) - Insulin Glargine (BASAGLAR KWIKPEN) 100 UNIT/ML; Inject 20 Units into the skin once daily at bedtime.  Dispense: 15 mL; Refill: 3 - metFORMIN (GLUCOPHAGE) 1000 MG tablet; Take 1 tablet (1,000 mg total) by mouth 2 (two) times daily with a meal.  Dispense: 60 tablet; Refill: 2 - Insulin Pen Needle 32G X 4 MM MISC; Use as directed  Dispense: 100 each; Refill: 3  2. Elevated lipids - She will continue current medication, low fat/cholesterol diet and exercise as tolerated. - atorvastatin (LIPITOR) 20 MG tablet; Take 1 tablet (20 mg total) by mouth daily.  Dispense: 30 tablet; Refill: 3    Follow-up: Return in about 1 month (around 06/01/2021), or if symptoms worsen or fail to improve.    Kristen Pettus Jerold Coombe, NP

## 2021-04-28 NOTE — Patient Instructions (Signed)
Recuento de carbohidratos para la diabetes mellitus en los adultos ?Carbohydrate Counting for Diabetes Mellitus, Adult ?El recuento de carbohidratos es un m?todo para llevar un registro de la cantidad de carbohidratos que se ingieren. La ingesta de carbohidratos aumenta la cantidad de az?car (glucosa) en la sangre. El recuento de la cantidad de carbohidratos que ingiere mejora el control de su glucemia. Esto, a su vez, le ayuda a controlar su diabetes. ?Los carbohidratos se miden en gramos (g) por porci?n. Es importante saber la cantidad de carbohidratos (en gramos o por tama?o de porci?n) que se puede ingerir en cada comida. Esto es diferente en cada persona. Un nutricionista puede ayudarlo a crear un plan de alimentaci?n y a calcular la cantidad de carbohidratos que debe ingerir en cada comida y colaci?n. ??Qu? alimentos contienen carbohidratos? ?Los siguientes alimentos incluyen carbohidratos: ?Granos, como panes y cereales. ?Frijoles secos y productos con soja. ?Verduras con almid?n, como papas, guisantes y ma?z. ?Frutas y jugos de frutas. ?Leche y yogur. ?Dulces y colaciones, como pasteles, galletas, caramelos, papas fritas de bolsa y refrescos. ??C?mo se calculan los carbohidratos de los alimentos? ?Hay dos maneras de calcular los carbohidratos de los alimentos. Puede leer las etiquetas de los alimentos o aprender cu?les son los tama?os de las porciones est?ndar de los alimentos. Puede usar cualquiera de estos m?todos o una combinaci?n de ambos. ?Usar la etiqueta de informaci?n nutricional ?La lista de Informaci?n nutricional est? incluida en las etiquetas de casi todas las bebidas y los alimentos envasados de los [place]. Esto incluye lo siguiente: ?El tama?o de la porci?n. ?Informaci?n sobre los nutrientes de cada porci?n, incluidos los gramos de carbohidratos por porci?n. ?Para usar la Informaci?n nutricional, decida cu?ntas porciones tomar?. Luego, multiplique el n?mero de porciones por la cantidad  de carbohidratos por porci?n. El n?mero resultante es la cantidad total de carbohidratos que comer?. ?Conocer los tama?os de las porciones est?ndar de los alimentos ?Cuando coma alimentos que contengan carbohidratos y que no est?n envasados o no incluyan la informaci?n nutricional en la etiqueta, debe medir las porciones para poder calcular los gramos de carbohidratos. ?Mida los alimentos que comer? con una balanza de alimentos o una taza medidora, si es necesario. ?Decida cu?ntas porciones de tama?o est?ndar comer?. ?Multiplique el n?mero de porciones por 15. En los alimentos que contienen carbohidratos, una porci?n equivale a 15 g de carbohidratos. ?Por ejemplo, si come 2 tazas o 10 onzas (300 g) de fresas, habr? comido 2 porciones y 30 g de carbohidratos (2 porciones x 15 g = 30 g). ?En el caso de las comidas que contienen mezclas de m?s de un alimento, como las sopas y los guisos, debe calcular los carbohidratos de cada alimento que se incluye. ?La siguiente lista contiene los tama?os de porciones est?ndar de los alimentos ricos en carbohidratos m?s comunes. Cada una de estas porciones tiene aproximadamente 15 g de carbohidratos: ?1 rebanada de pan. ?1 tortilla de seis pulgadas (15 cm). ?? de taza o 2 onzas (53 g) de arroz o pasta cocidos. ?? taza o 3 onzas (85 g) de lentejas o frijoles cocidos o enlatados, escurridos y enjuagados. ?? taza o 3 onzas (85 g) de verduras con almid?n, como guisantes, ma?z o zapallo. ?? taza o 4 onzas (120 g) de cereal caliente. ?? taza o 3 onzas (85 g) de papas hervidas o en pur?, o ? o 3 onzas (85 g) de una papa grande al horno. ?? taza o 4 onzas fluidas (118 ml) de jugo de frutas. ?1 taza u   8 onzas fluidas (237 ml) de leche. ?1 unidad peque?a o 4 onzas (106 g) de manzana. ?? unidad o 2 onzas (63 g) de una banana mediana. ?1 taza o 5 oz (150 g) de fresas. ?3 tazas o 1 oz (28.3 g) de palomitas de ma?z. ??Cu?l ser?a un ejemplo de recuento de carbohidratos? ?Para calcular los gramos  de carbohidratos de este ejemplo de comida, siga los pasos que se describen a continuaci?n. ?Ejemplo de comida ?3 onzas (85 g) de pechugas de pollo. ?? de taza o 4 onzas (106 g) de arroz integral. ?? taza o 3 onzas (85 g) de ma?z. ?1 taza u 8 onzas fluidas (237 ml) de leche. ?1 taza o 5 onzas (150 g) de fresas con crema batida sin az?car. ?C?lculo de carbohidratos ?Identifique los alimentos que contienen carbohidratos: ?Arroz. ?Ma?z. ?Leche. ?Fresas. ?Calcule cu?ntas porciones come de cada alimento: ?2 porciones de arroz. ?1 porci?n de ma?z. ?1 porci?n de leche. ?1 porci?n de fresas. ?Multiplique cada n?mero de porciones por 15 g: ?2 porciones de arroz x 15 g = 30 g. ?1 porci?n de ma?z x 15 g = 15 g. ?1 porci?n de leche x 15 g = 15 g. ?1 porci?n de fresas x 15 g = 15 g. ?Sume todas las cantidades para conocer el total de gramos de carbohidratos consumidos: ?30 g + 15 g + 15 g + 15 g = 75 g de carbohidratos en total. ?Consejos para seguir este plan ?Al ir de compras ?Elabore un plan de comidas y luego haga una lista de compras. ?Compre verduras frescas y congeladas, frutas frescas y congeladas, productos l?cteos, huevos, frijoles, lentejas y cereales integrales. ?F?jese en las etiquetas de los alimentos. Elija los alimentos que tengan m?s fibra y menos az?car. ?Evite los alimentos procesados y los alimentos con az?cares agregados. ?Planificaci?n de las comidas ?Trate de consumir la misma cantidad de gramos de carbohidratos en cada comida y en cada colaci?n. ?Planifique tomar comidas y colaciones regulares y equilibradas. ?D?nde buscar m?s informaci?n ?American Diabetes Association (Asociaci?n [place] de la Diabetes): diabetes.org ?Centers for Disease Control and Prevention (Centros para el Control y la Prevenci?n de Enfermedades): cdc.gov ?Academy of Nutrition and Dietetics (Academia de Nutrici?n y Diet?tica): eatright.org ?Association of Diabetes Care & Education Specialists (Asociaci?n de Especialistas en  Atenci?n y Educaci?n sobre la Diabetes): diabeteseducator.org ?Resumen ?El recuento de carbohidratos es un m?todo para llevar un registro de la cantidad de carbohidratos que se ingieren. ?La ingesta de carbohidratos aumenta la cantidad de az?car (glucosa) en la sangre. ?El recuento de la cantidad de carbohidratos que ingiere mejora el control de su glucemia. Esto le ayuda a controlar su diabetes. ?Un nutricionista puede ayudarlo a crear un plan de alimentaci?n y a calcular la cantidad de carbohidratos que debe ingerir en cada comida y colaci?n. ?Esta informaci?n no tiene como fin reemplazar el consejo del m?dico. Aseg?rese de hacerle al m?dico cualquier pregunta que tenga. ?Document Revised: 10/28/2019 Document Reviewed: 10/28/2019 ?Elsevier Patient Education ? 2022 Elsevier Inc. ? ?

## 2021-04-28 NOTE — Progress Notes (Signed)
Patient triaged with the help of [employer] interpreter services id # [phone]. ?

## 2021-04-29 ENCOUNTER — Other Ambulatory Visit: Payer: Self-pay

## 2021-06-02 ENCOUNTER — Ambulatory Visit: Payer: Self-pay | Admitting: Gerontology

## 2021-06-02 ENCOUNTER — Encounter: Payer: Self-pay | Admitting: Gerontology

## 2021-06-02 VITALS — BP 109/66 | HR 72 | Temp 97.9°F | Ht 59.0 in | Wt 174.9 lb

## 2021-06-02 DIAGNOSIS — E1165 Type 2 diabetes mellitus with hyperglycemia: Secondary | ICD-10-CM

## 2021-06-02 LAB — GLUCOSE, POCT (MANUAL RESULT ENTRY): POC Glucose: 140 mg/dl — AB (ref 70–99)

## 2021-06-02 NOTE — Patient Instructions (Signed)
Recuento de carbohidratos para la diabetes mellitus en los adultos ?Carbohydrate Counting for Diabetes Mellitus, Adult ?El recuento de carbohidratos es un m?todo para llevar un registro de la cantidad de carbohidratos que se ingieren. La ingesta de carbohidratos aumenta la cantidad de az?car (glucosa) en la sangre. El recuento de la cantidad de carbohidratos que ingiere mejora el control de su glucemia. Esto, a su vez, le ayuda a controlar su diabetes. ?Los carbohidratos se miden en gramos (g) por porci?n. Es importante saber la cantidad de carbohidratos (en gramos o por tama?o de porci?n) que se puede ingerir en cada comida. Esto es diferente en cada persona. Un nutricionista puede ayudarlo a crear un plan de alimentaci?n y a calcular la cantidad de carbohidratos que debe ingerir en cada comida y colaci?n. ??Qu? alimentos contienen carbohidratos? ?Los siguientes alimentos incluyen carbohidratos: ?Granos, como panes y cereales. ?Frijoles secos y productos con soja. ?Verduras con almid?n, como papas, guisantes y ma?z. ?Frutas y jugos de frutas. ?Leche y yogur. ?Dulces y colaciones, como pasteles, galletas, caramelos, papas fritas de bolsa y refrescos. ??C?mo se calculan los carbohidratos de los alimentos? ?Hay dos maneras de calcular los carbohidratos de los alimentos. Puede leer las etiquetas de los alimentos o aprender cu?les son los tama?os de las porciones est?ndar de los alimentos. Puede usar cualquiera de estos m?todos o una combinaci?n de ambos. ?Usar la etiqueta de informaci?n nutricional ?La lista de Informaci?n nutricional est? incluida en las etiquetas de casi todas las bebidas y los alimentos envasados de los [place]. Esto incluye lo siguiente: ?El tama?o de la porci?n. ?Informaci?n sobre los nutrientes de cada porci?n, incluidos los gramos de carbohidratos por porci?n. ?Para usar la Informaci?n nutricional, decida cu?ntas porciones tomar?. Luego, multiplique el n?mero de porciones por la cantidad  de carbohidratos por porci?n. El n?mero resultante es la cantidad total de carbohidratos que comer?. ?Conocer los tama?os de las porciones est?ndar de los alimentos ?Cuando coma alimentos que contengan carbohidratos y que no est?n envasados o no incluyan la informaci?n nutricional en la etiqueta, debe medir las porciones para poder calcular los gramos de carbohidratos. ?Mida los alimentos que comer? con una balanza de alimentos o una taza medidora, si es necesario. ?Decida cu?ntas porciones de tama?o est?ndar comer?. ?Multiplique el n?mero de porciones por 15. En los alimentos que contienen carbohidratos, una porci?n equivale a 15 g de carbohidratos. ?Por ejemplo, si come 2 tazas o 10 onzas (300 g) de fresas, habr? comido 2 porciones y 30 g de carbohidratos (2 porciones x 15 g = 30 g). ?En el caso de las comidas que contienen mezclas de m?s de un alimento, como las sopas y los guisos, debe calcular los carbohidratos de cada alimento que se incluye. ?La siguiente lista contiene los tama?os de porciones est?ndar de los alimentos ricos en carbohidratos m?s comunes. Cada una de estas porciones tiene aproximadamente 15 g de carbohidratos: ?1 rebanada de pan. ?1 tortilla de seis pulgadas (15 cm). ?? de taza o 2 onzas (53 g) de arroz o pasta cocidos. ?? taza o 3 onzas (85 g) de lentejas o frijoles cocidos o enlatados, escurridos y enjuagados. ?? taza o 3 onzas (85 g) de verduras con almid?n, como guisantes, ma?z o zapallo. ?? taza o 4 onzas (120 g) de cereal caliente. ?? taza o 3 onzas (85 g) de papas hervidas o en pur?, o ? o 3 onzas (85 g) de una papa grande al horno. ?? taza o 4 onzas fluidas (118 ml) de jugo de frutas. ?1 taza u   8 onzas fluidas (237 ml) de leche. ?1 unidad peque?a o 4 onzas (106 g) de manzana. ?? unidad o 2 onzas (63 g) de una banana mediana. ?1 taza o 5 oz (150 g) de fresas. ?3 tazas o 1 oz (28.3 g) de palomitas de ma?z. ??Cu?l ser?a un ejemplo de recuento de carbohidratos? ?Para calcular los gramos  de carbohidratos de este ejemplo de comida, siga los pasos que se describen a continuaci?n. ?Ejemplo de comida ?3 onzas (85 g) de pechugas de pollo. ?? de taza o 4 onzas (106 g) de arroz integral. ?? taza o 3 onzas (85 g) de ma?z. ?1 taza u 8 onzas fluidas (237 ml) de leche. ?1 taza o 5 onzas (150 g) de fresas con crema batida sin az?car. ?C?lculo de carbohidratos ?Identifique los alimentos que contienen carbohidratos: ?Arroz. ?Ma?z. ?Leche. ?Fresas. ?Calcule cu?ntas porciones come de cada alimento: ?2 porciones de arroz. ?1 porci?n de ma?z. ?1 porci?n de leche. ?1 porci?n de fresas. ?Multiplique cada n?mero de porciones por 15 g: ?2 porciones de arroz x 15 g = 30 g. ?1 porci?n de ma?z x 15 g = 15 g. ?1 porci?n de leche x 15 g = 15 g. ?1 porci?n de fresas x 15 g = 15 g. ?Sume todas las cantidades para conocer el total de gramos de carbohidratos consumidos: ?30 g + 15 g + 15 g + 15 g = 75 g de carbohidratos en total. ?Consejos para seguir este plan ?Al ir de compras ?Elabore un plan de comidas y luego haga una lista de compras. ?Compre verduras frescas y congeladas, frutas frescas y congeladas, productos l?cteos, huevos, frijoles, lentejas y cereales integrales. ?F?jese en las etiquetas de los alimentos. Elija los alimentos que tengan m?s fibra y menos az?car. ?Evite los alimentos procesados y los alimentos con az?cares agregados. ?Planificaci?n de las comidas ?Trate de consumir la misma cantidad de gramos de carbohidratos en cada comida y en cada colaci?n. ?Planifique tomar comidas y colaciones regulares y equilibradas. ?D?nde buscar m?s informaci?n ?American Diabetes Association (Asociaci?n [place] de la Diabetes): diabetes.org ?Centers for Disease Control and Prevention (Centros para el Control y la Prevenci?n de Enfermedades): cdc.gov ?Academy of Nutrition and Dietetics (Academia de Nutrici?n y Diet?tica): eatright.org ?Association of Diabetes Care & Education Specialists (Asociaci?n de Especialistas en  Atenci?n y Educaci?n sobre la Diabetes): diabeteseducator.org ?Resumen ?El recuento de carbohidratos es un m?todo para llevar un registro de la cantidad de carbohidratos que se ingieren. ?La ingesta de carbohidratos aumenta la cantidad de az?car (glucosa) en la sangre. ?El recuento de la cantidad de carbohidratos que ingiere mejora el control de su glucemia. Esto le ayuda a controlar su diabetes. ?Un nutricionista puede ayudarlo a crear un plan de alimentaci?n y a calcular la cantidad de carbohidratos que debe ingerir en cada comida y colaci?n. ?Esta informaci?n no tiene como fin reemplazar el consejo del m?dico. Aseg?rese de hacerle al m?dico cualquier pregunta que tenga. ?Document Revised: 10/28/2019 Document Reviewed: 10/28/2019 ?Elsevier Patient Education ? 2022 Elsevier Inc. ? ?

## 2021-06-02 NOTE — Progress Notes (Deleted)
? ?Established Patient Office Visit ? ?Subjective:  ?Patient ID: Kristen Aguirre, female    DOB: 02/06/1966  Age: 56 y.o. MRN: [id number] ? ?CC:  ?Chief Complaint  ?Patient presents with  ? Follow-up  ?  Follow up. Checks sugars at home. Brought in list  ? ? ?HPI ?Kristen Aguirre presents for *** ? ?Past Medical History:  ?Diagnosis Date  ? Closed dislocation of left hip ([place]) 11/19/2019  ? Diabetes mellitus without complication ([place])   ? Displaced fracture of posterior wall of left acetabulum, initial encounter for closed fracture ([place]) 11/19/2019  ? Thyroid disease   ? ? ?Past Surgical History:  ?Procedure Laterality Date  ? CLOSED REDUCTION FINGER WITH PERCUTANEOUS PINNING Left 12/02/2019  ? Procedure: CLOSED REDUCTION FINGER WITH PERCUTANEOUS PINNING SMALL FINGER;  Surgeon: Leanora Cover, MD;  Location: [place];  Service: Orthopedics;  Laterality: Left;  ? EXTREMITY WIRE/PIN REMOVAL Left 01/13/2020  ? Procedure: REMOVAL K-WIRE/PIN EXTREMITY;  Surgeon: Leanora Cover, MD;  Location: [place];  Service: Orthopedics;  Laterality: Left;  ? FRACTURE SURGERY    ? HARDWARE REMOVAL Right 01/13/2020  ? Procedure: EXTERNAL FIXATOR RIGHT DISTAL RADIUS   ;  Surgeon: Leanora Cover, MD;  Location: [place];  Service: Orthopedics;  Laterality: Right;  ? HIP CLOSED REDUCTION Left 11/15/2019  ? Procedure: CLOSED REDUCTION HIP;  Surgeon: Nicholes Stairs, MD;  Location: [place];  Service: Orthopedics;  Laterality: Left;  ? I & D EXTREMITY Right 11/15/2019  ? Procedure: IRRIGATION AND DEBRIDEMENT; EXPLORATION POSSIBLE REPAIR OF TENDON AND NERVE;  Surgeon: Leanora Cover, MD;  Location: [place];  Service: Orthopedics;  Laterality: Right;  ? OPEN REDUCTION INTERNAL FIXATION (ORIF) DISTAL RADIAL FRACTURE Right 11/15/2019  ? Procedure: DISTAL RADIAL FRACTURE -RIGHT  EXTERNAL FIXATION; IRRIGATION AND DEBRIDEMENT LEFT SMALL FINGER WITH CLOSURE LACERATION. ;  Surgeon: Leanora Cover, MD;  Location: [place];  Service: Orthopedics;  Laterality: Right;  LEFT SMALL FINGER IRRIGATION AND DEBRIDEMENT WITH CLOSURE LACERATION ADDED.  ? OPEN REDUCTION INTERNAL FIXATION (ORIF) DISTAL RADIAL FRACTURE Right 12/02/2019  ? Procedure: OPEN REDUCTION RADIUS WITH Shirlean Kelly;  Surgeon: Leanora Cover, MD;  Location: [place];  Service: Orthopedics;  Laterality: Right;  ? OPEN REDUCTION INTERNAL FIXATION ACETABULUM FRACTURE POSTERIOR Left 11/17/2019  ? Procedure: OPEN REDUCTION INTERNAL FIXATION ACETABULUM FRACTURE POSTERIOR;  Surgeon: Altamese [place], MD;  Location: [place];  Service: Orthopedics;  Laterality: Left;  ? ? ?Family History  ?Problem Relation Age of Onset  ? Kidney disease Mother   ? Diabetes Mother   ? Other Father   ?     unknown medical history  ? Kidney disease Sister   ? Diabetes Sister   ? Diabetes Maternal Aunt   ? Diabetes Maternal Grandfather   ? Breast cancer Neg Hx   ? ? ?Social History  ? ?Socioeconomic History  ? Marital status: Single  ?  Spouse name: Not on file  ? Number of children: Not on file  ? Years of education: Not on file  ? Highest education level: Not on file  ?Occupational History  ? Not on file  ?Tobacco Use  ? Smoking status: Never  ? Smokeless tobacco: Never  ?Vaping Use  ? Vaping Use: Never used  ?Substance and Sexual Activity  ? Alcohol use: Yes  ?  Comment: social - once per month  ? Drug use: Never  ? Sexual activity: Not on file  ?Other Topics Concern  ? Not on file  ?  Social History Narrative  ? Not on file  ? ?Social Determinants of Health  ? ?Financial Resource Strain: Not on file  ?Food Insecurity: No Food Insecurity  ? Worried About Charity fundraiser in the Last Year: Never true  ? Ran Out of Food in the Last Year: Never true  ?Transportation Needs: No Transportation Needs  ? Lack of Transportation (Medical): No  ? Lack of Transportation (Non-Medical): No  ?Physical Activity: Not on file  ?Stress: Not on file  ?Social Connections: Not on file  ?Intimate Partner Violence: Not on  file  ? ? ?Outpatient Medications Prior to Visit  ?Medication Sig Dispense Refill  ? atorvastatin (LIPITOR) 20 MG tablet Take 1 tablet (20 mg total) by mouth daily. 30 tablet 3  ? Insulin Glargine (BASAGLAR KWIKPEN) 100 UNIT/ML Inject 20 Units into the skin once daily at bedtime. 15 mL 3  ? levothyroxine (SYNTHROID) 50 MCG tablet TAKE 1 TABLET (50 MCG TOTAL) BY MOUTH DAILY BEFORE BREAKFAST. 30 tablet 4  ? metFORMIN (GLUCOPHAGE) 1000 MG tablet Take 1 tablet (1,000 mg total) by mouth 2 (two) times daily with a meal. 60 tablet 2  ? acetaminophen (TYLENOL) 325 MG tablet Take 2 tablets by mouth every 6 (six) hours as needed. (Patient not taking: Reported on 04/28/2021)    ? Blood Glucose Monitoring Suppl (BLOOD GLUCOSE MONITOR SYSTEM) w/Device KIT Use up to 4 times daily as directed 1 kit 0  ? Elastic Bandages & Supports (ABDOMINAL BINDER/ELASTIC 2XL) MISC 1 each by Does not apply route daily. 1 each 0  ? glucose blood (RIGHTEST GS550 BLOOD GLUCOSE) test strip USED AS DIRECTED UP TO 4 TIMES DAILY. 100 each 0  ? hydrocortisone cream 1 % Apply 1 application topically to the affect area(s) 2 (two) times daily. 28 g 0  ? ibuprofen (ADVIL) 200 MG tablet Take 200 mg by mouth every 6 (six) hours as needed.    ? Insulin Pen Needle 32G X 4 MM MISC Use as directed 100 each 3  ? Rightest GL300 Lancets MISC USE AS DIRECTED UP TO 4 TIMES DAILY. 100 each 0  ? ?No facility-administered medications prior to visit.  ? ? ?No Known Allergies ? ?ROS ?Review of Systems ? ?  ?Objective:  ?  ?Physical Exam ? ?BP 109/66 (BP Location: Right Arm, Patient Position: Sitting, Cuff Size: Large)   Pulse 72   Temp 97.9 ?F (36.6 ?C) (Oral)   Ht $R'4\' 11"'Aq$  (1.499 m)   Wt 174 lb 14.4 oz (79.3 kg)   LMP 07/21/1997 (Approximate)   SpO2 96%   BMI 35.33 kg/m?  ?Wt Readings from Last 3 Encounters:  ?06/02/21 174 lb 14.4 oz (79.3 kg)  ?04/28/21 178 lb 3.2 oz (80.8 kg)  ?02/03/21 179 lb 9.6 oz (81.5 kg)  ? ? ? ?Health Maintenance Due  ?Topic Date Due  ?  COVID-19 Vaccine (1) Never done  ? Hepatitis C Screening  Never done  ? Zoster Vaccines- Shingrix (1 of 2) Never done  ? PAP SMEAR-Modifier  Never done  ? COLONOSCOPY (Pts 45-57yrs Insurance coverage will need to be confirmed)  Never done  ? URINE MICROALBUMIN  07/21/2021  ? ? ?There are no preventive care reminders to display for this patient. ? ?Lab Results  ?Component Value Date  ? TSH 1.930 01/26/2021  ? ?Lab Results  ?Component Value Date  ? WBC 5.3 07/21/2020  ? HGB 16.2 (H) 07/21/2020  ? HCT 48.5 (H) 07/21/2020  ? MCV 96 07/21/2020  ?  PLT 176 07/21/2020  ? ?Lab Results  ?Component Value Date  ? NA 136 07/21/2020  ? K 4.2 07/21/2020  ? CO2 25 07/21/2020  ? GLUCOSE 327 (H) 07/21/2020  ? BUN 10 07/21/2020  ? CREATININE 0.70 07/21/2020  ? BILITOT 0.6 07/21/2020  ? ALKPHOS 133 (H) 07/21/2020  ? AST 27 07/21/2020  ? ALT 28 07/21/2020  ? PROT 7.9 07/21/2020  ? ALBUMIN 4.5 07/21/2020  ? CALCIUM 9.1 07/21/2020  ? ANIONGAP 11 01/13/2020  ? EGFR 103 07/21/2020  ? ?Lab Results  ?Component Value Date  ? CHOL 168 10/27/2020  ? ?Lab Results  ?Component Value Date  ? HDL 53 10/27/2020  ? ?Lab Results  ?Component Value Date  ? [place] 95 10/27/2020  ? ?Lab Results  ?Component Value Date  ? TRIG 111 10/27/2020  ? ?Lab Results  ?Component Value Date  ? CHOLHDL 3.2 10/27/2020  ? ?Lab Results  ?Component Value Date  ? HGBA1C 9.8 (A) 04/28/2021  ? ? ?  ?Assessment & Plan:  ? ?Problem List Items Addressed This Visit   ? ?  ? Endocrine  ? Type 2 diabetes mellitus with hyperglycemia (HCC) - Primary  ? Relevant Orders  ? POCT Glucose (CBG) (Completed)  ? HgB A1c  ? Urine Microalbumin w/creat. ratio  ? ? ?No orders of the defined types were placed in this encounter. ? ? ?Follow-up: No follow-ups on file.  ? ? ?Lonia Skinner, RN ?

## 2021-06-02 NOTE — Progress Notes (Addendum)
I concur with the note ? ?Established Patient Office Visit ? ?Subjective:  ?Patient ID: Kristen Aguirre, female    DOB: Feb 20, 1966  Age: 56 y.o. MRN: [id number] ? ?CC:  ?Chief Complaint  ?Patient presents with  ? Follow-up  ?  Follow up. Checks sugars at home. Brought in list  ? ? ?HPI ? ?Kristen Aguirre  is a 56 year old female who has history of type 2 diabetes, thyroid disease, hyperlipidemia presents for follow up. Her HgbA1c done on 04/28/21 was 9.8%, blood glucose at this visit was 140 mg/dl. She checks her blood glucose twice daily and she brought her log to this appointment the morning reading ranges  from 107 mg/dl to 185 mg/d and the night reading were from 140 mg/dl to to 250 mg/dl. She denies hypo/hyperglycemic symptoms, peripheral neuropathy and performs daily foot checks. She states that she is compliant with her medications and continues to make healthy lifestyle changes.  She states that she experiences pain on her right abdominal quadrant due to her hernia if she sleeps on her right side. She has an upcoming appointment with [facility] on 06/23/21. She states that she's doing well  overall, and denies  any further complaint. ?  ? ?Past Medical History:  ?Diagnosis Date  ? Closed dislocation of left hip ([place]) 11/19/2019  ? Diabetes mellitus without complication ([place])   ? Displaced fracture of posterior wall of left acetabulum, initial encounter for closed fracture ([place]) 11/19/2019  ? Thyroid disease   ? ? ?Past Surgical History:  ?Procedure Laterality Date  ? CLOSED REDUCTION FINGER WITH PERCUTANEOUS PINNING Left 12/02/2019  ? Procedure: CLOSED REDUCTION FINGER WITH PERCUTANEOUS PINNING SMALL FINGER;  Surgeon: Leanora Cover, MD;  Location: [place];  Service: Orthopedics;  Laterality: Left;  ? EXTREMITY WIRE/PIN REMOVAL Left 01/13/2020  ? Procedure: REMOVAL K-WIRE/PIN EXTREMITY;  Surgeon: Leanora Cover, MD;  Location: [place];  Service: Orthopedics;  Laterality: Left;   ? FRACTURE SURGERY    ? HARDWARE REMOVAL Right 01/13/2020  ? Procedure: EXTERNAL FIXATOR RIGHT DISTAL RADIUS   ;  Surgeon: Leanora Cover, MD;  Location: [place];  Service: Orthopedics;  Laterality: Right;  ? HIP CLOSED REDUCTION Left 11/15/2019  ? Procedure: CLOSED REDUCTION HIP;  Surgeon: Nicholes Stairs, MD;  Location: [place];  Service: Orthopedics;  Laterality: Left;  ? I & D EXTREMITY Right 11/15/2019  ? Procedure: IRRIGATION AND DEBRIDEMENT; EXPLORATION POSSIBLE REPAIR OF TENDON AND NERVE;  Surgeon: Leanora Cover, MD;  Location: [place];  Service: Orthopedics;  Laterality: Right;  ? OPEN REDUCTION INTERNAL FIXATION (ORIF) DISTAL RADIAL FRACTURE Right 11/15/2019  ? Procedure: DISTAL RADIAL FRACTURE -RIGHT  EXTERNAL FIXATION; IRRIGATION AND DEBRIDEMENT LEFT SMALL FINGER WITH CLOSURE LACERATION. ;  Surgeon: Leanora Cover, MD;  Location: [place];  Service: Orthopedics;  Laterality: Right;  LEFT SMALL FINGER IRRIGATION AND DEBRIDEMENT WITH CLOSURE LACERATION ADDED.  ? OPEN REDUCTION INTERNAL FIXATION (ORIF) DISTAL RADIAL FRACTURE Right 12/02/2019  ? Procedure: OPEN REDUCTION RADIUS WITH Shirlean Kelly;  Surgeon: Leanora Cover, MD;  Location: [place];  Service: Orthopedics;  Laterality: Right;  ? OPEN REDUCTION INTERNAL FIXATION ACETABULUM FRACTURE POSTERIOR Left 11/17/2019  ? Procedure: OPEN REDUCTION INTERNAL FIXATION ACETABULUM FRACTURE POSTERIOR;  Surgeon: Altamese Learned, MD;  Location: [place];  Service: Orthopedics;  Laterality: Left;  ? ? ?Family History  ?Problem Relation Age of Onset  ? Kidney disease Mother   ? Diabetes Mother   ? Other Father   ?  unknown medical history  ? Kidney disease Sister   ? Diabetes Sister   ? Diabetes Maternal Aunt   ? Diabetes Maternal Grandfather   ? Breast cancer Neg Hx   ? ? ?Social History  ? ?Socioeconomic History  ? Marital status: Single  ?  Spouse name: Not on file  ? Number of children: Not on file  ? Years of education: Not on file  ? Highest education  level: Not on file  ?Occupational History  ? Not on file  ?Tobacco Use  ? Smoking status: Never  ? Smokeless tobacco: Never  ?Vaping Use  ? Vaping Use: Never used  ?Substance and Sexual Activity  ? Alcohol use: Yes  ?  Comment: social - once per month  ? Drug use: Never  ? Sexual activity: Not on file  ?Other Topics Concern  ? Not on file  ?Social History Narrative  ? Not on file  ? ?Social Determinants of Health  ? ?Financial Resource Strain: Not on file  ?Food Insecurity: No Food Insecurity  ? Worried About Charity fundraiser in the Last Year: Never true  ? Ran Out of Food in the Last Year: Never true  ?Transportation Needs: No Transportation Needs  ? Lack of Transportation (Medical): No  ? Lack of Transportation (Non-Medical): No  ?Physical Activity: Not on file  ?Stress: Not on file  ?Social Connections: Not on file  ?Intimate Partner Violence: Not on file  ? ? ?Outpatient Medications Prior to Visit  ?Medication Sig Dispense Refill  ? atorvastatin (LIPITOR) 20 MG tablet Take 1 tablet (20 mg total) by mouth daily. 30 tablet 3  ? Insulin Glargine (BASAGLAR KWIKPEN) 100 UNIT/ML Inject 20 Units into the skin once daily at bedtime. 15 mL 3  ? levothyroxine (SYNTHROID) 50 MCG tablet TAKE 1 TABLET (50 MCG TOTAL) BY MOUTH DAILY BEFORE BREAKFAST. 30 tablet 4  ? metFORMIN (GLUCOPHAGE) 1000 MG tablet Take 1 tablet (1,000 mg total) by mouth 2 (two) times daily with a meal. 60 tablet 2  ? acetaminophen (TYLENOL) 325 MG tablet Take 2 tablets by mouth every 6 (six) hours as needed. (Patient not taking: Reported on 04/28/2021)    ? Blood Glucose Monitoring Suppl (BLOOD GLUCOSE MONITOR SYSTEM) w/Device KIT Use up to 4 times daily as directed 1 kit 0  ? Elastic Bandages & Supports (ABDOMINAL BINDER/ELASTIC 2XL) MISC 1 each by Does not apply route daily. 1 each 0  ? glucose blood (RIGHTEST GS550 BLOOD GLUCOSE) test strip USED AS DIRECTED UP TO 4 TIMES DAILY. 100 each 0  ? hydrocortisone cream 1 % Apply 1 application topically to  the affect area(s) 2 (two) times daily. 28 g 0  ? ibuprofen (ADVIL) 200 MG tablet Take 200 mg by mouth every 6 (six) hours as needed.    ? Insulin Pen Needle 32G X 4 MM MISC Use as directed 100 each 3  ? Rightest GL300 Lancets MISC USE AS DIRECTED UP TO 4 TIMES DAILY. 100 each 0  ? ?No facility-administered medications prior to visit.  ? ? ?No Known Allergies ? ?ROS ?Review of Systems  ?Constitutional: Negative.   ?Eyes: Negative.   ?Respiratory: Negative.    ?Cardiovascular: Negative.   ?Gastrointestinal: Negative.   ?     Hernia to right abdominal quadrant  ?Endocrine: Negative.   ?Skin: Negative.   ?Neurological: Negative.   ?Psychiatric/Behavioral: Negative.    ? ?  ?Objective:  ?  ?Physical Exam ?HENT:  ?   Head: Normocephalic and  atraumatic.  ?   Mouth/Throat:  ?   Mouth: Mucous membranes are moist.  ?Eyes:  ?   Extraocular Movements: Extraocular movements intact.  ?   Conjunctiva/sclera: Conjunctivae normal.  ?   Pupils: Pupils are equal, round, and reactive to light.  ?Cardiovascular:  ?   Rate and Rhythm: Normal rate and regular rhythm.  ?   Pulses: Normal pulses.  ?   Heart sounds: Normal heart sounds.  ?Pulmonary:  ?   Effort: Pulmonary effort is normal.  ?   Breath sounds: Normal breath sounds.  ?Abdominal:  ?   General: Bowel sounds are normal. There is distension.  ?   Palpations: Abdomen is soft.  ?   Tenderness: There is abdominal tenderness in the right lower quadrant.  ?   Hernia: A hernia (right abdomen) is present.  ?Skin: ?   General: Skin is warm.  ?Neurological:  ?   General: No focal deficit present.  ?   Mental Status: She is alert and oriented to person, place, and time. Mental status is at baseline.  ?Psychiatric:     ?   Mood and Affect: Mood normal.     ?   Behavior: Behavior normal.     ?   Thought Content: Thought content normal.     ?   Judgment: Judgment normal.  ? ? ?BP 109/66 (BP Location: Right Arm, Patient Position: Sitting, Cuff Size: Large)   Pulse 72   Temp 97.9 ?F (36.6 ?C)  (Oral)   Ht _0  (1.499 m)   Wt 174 lb 14.4 oz (79.3 kg)   LMP 07/21/1997 (Approximate)   SpO2 96%   BMI 35.33 kg/m?  ?Wt Readings from Last 3 Encounters:  ?06/02/21 174 lb 14.4 oz (79.3 kg)  ?04/28/21

## 2021-06-02 NOTE — Progress Notes (Deleted)
? ?Established Patient Office Visit ? ?Subjective:  ?Patient ID: Malachi Pro, female    DOB: August 13, 1965  Age: 56 y.o. MRN: [id number] ? ?CC:  ?Chief Complaint  ?Patient presents with  ? Follow-up  ?  Follow up. Checks sugars at home. Brought in list  ? ? ?HPI ?Uriel Steele Sizer  is a 56 year old female who has history of type 2 diabetes, thyroid disease, hyperlipidemia presents for follow up. Her HgbA1c done during 9.8%, blood glucose at this visit read 140 mg/dl. She checks her blood glucose twice a day and she brought her log to this appointment the reading for morning ranges  from 107 to 185  and the night reading from 140 to to 250 She denies hypo/hyperglycemic symptoms, peripheral neuropathy and performs daily foot checks. She states that she is compliant with her medications and continues to make healthy lifestyle changes.  Overall, she states that she's doing well and offers no further complaint. ?  ? ?Past Medical History:  ?Diagnosis Date  ? Closed dislocation of left hip ([place]) 11/19/2019  ? Diabetes mellitus without complication ([place])   ? Displaced fracture of posterior wall of left acetabulum, initial encounter for closed fracture ([place]) 11/19/2019  ? Thyroid disease   ? ? ?Past Surgical History:  ?Procedure Laterality Date  ? CLOSED REDUCTION FINGER WITH PERCUTANEOUS PINNING Left 12/02/2019  ? Procedure: CLOSED REDUCTION FINGER WITH PERCUTANEOUS PINNING SMALL FINGER;  Surgeon: Leanora Cover, MD;  Location: [place];  Service: Orthopedics;  Laterality: Left;  ? EXTREMITY WIRE/PIN REMOVAL Left 01/13/2020  ? Procedure: REMOVAL K-WIRE/PIN EXTREMITY;  Surgeon: Leanora Cover, MD;  Location: [place];  Service: Orthopedics;  Laterality: Left;  ? FRACTURE SURGERY    ? HARDWARE REMOVAL Right 01/13/2020  ? Procedure: EXTERNAL FIXATOR RIGHT DISTAL RADIUS   ;  Surgeon: Leanora Cover, MD;  Location: [place];  Service: Orthopedics;  Laterality: Right;  ? HIP CLOSED  REDUCTION Left 11/15/2019  ? Procedure: CLOSED REDUCTION HIP;  Surgeon: Nicholes Stairs, MD;  Location: [place];  Service: Orthopedics;  Laterality: Left;  ? I & D EXTREMITY Right 11/15/2019  ? Procedure: IRRIGATION AND DEBRIDEMENT; EXPLORATION POSSIBLE REPAIR OF TENDON AND NERVE;  Surgeon: Leanora Cover, MD;  Location: [place];  Service: Orthopedics;  Laterality: Right;  ? OPEN REDUCTION INTERNAL FIXATION (ORIF) DISTAL RADIAL FRACTURE Right 11/15/2019  ? Procedure: DISTAL RADIAL FRACTURE -RIGHT  EXTERNAL FIXATION; IRRIGATION AND DEBRIDEMENT LEFT SMALL FINGER WITH CLOSURE LACERATION. ;  Surgeon: Leanora Cover, MD;  Location: [place];  Service: Orthopedics;  Laterality: Right;  LEFT SMALL FINGER IRRIGATION AND DEBRIDEMENT WITH CLOSURE LACERATION ADDED.  ? OPEN REDUCTION INTERNAL FIXATION (ORIF) DISTAL RADIAL FRACTURE Right 12/02/2019  ? Procedure: OPEN REDUCTION RADIUS WITH Shirlean Kelly;  Surgeon: Leanora Cover, MD;  Location: [place];  Service: Orthopedics;  Laterality: Right;  ? OPEN REDUCTION INTERNAL FIXATION ACETABULUM FRACTURE POSTERIOR Left 11/17/2019  ? Procedure: OPEN REDUCTION INTERNAL FIXATION ACETABULUM FRACTURE POSTERIOR;  Surgeon: Altamese Adak, MD;  Location: ;  Service: Orthopedics;  Laterality: Left;  ? ? ?Family History  ?Problem Relation Age of Onset  ? Kidney disease Mother   ? Diabetes Mother   ? Other Father   ?     unknown medical history  ? Kidney disease Sister   ? Diabetes Sister   ? Diabetes Maternal Aunt   ? Diabetes Maternal Grandfather   ? Breast cancer Neg Hx   ? ? ?Social History  ? ?Socioeconomic History  ?  Marital status: Single  ?  Spouse name: Not on file  ? Number of children: Not on file  ? Years of education: Not on file  ? Highest education level: Not on file  ?Occupational History  ? Not on file  ?Tobacco Use  ? Smoking status: Never  ? Smokeless tobacco: Never  ?Vaping Use  ? Vaping Use: Never used  ?Substance and Sexual Activity  ? Alcohol use: Yes  ?  Comment: social -  once per month  ? Drug use: Never  ? Sexual activity: Not on file  ?Other Topics Concern  ? Not on file  ?Social History Narrative  ? Not on file  ? ?Social Determinants of Health  ? ?Financial Resource Strain: Not on file  ?Food Insecurity: No Food Insecurity  ? Worried About Charity fundraiser in the Last Year: Never true  ? Ran Out of Food in the Last Year: Never true  ?Transportation Needs: No Transportation Needs  ? Lack of Transportation (Medical): No  ? Lack of Transportation (Non-Medical): No  ?Physical Activity: Not on file  ?Stress: Not on file  ?Social Connections: Not on file  ?Intimate Partner Violence: Not on file  ? ? ?Outpatient Medications Prior to Visit  ?Medication Sig Dispense Refill  ? atorvastatin (LIPITOR) 20 MG tablet Take 1 tablet (20 mg total) by mouth daily. 30 tablet 3  ? Insulin Glargine (BASAGLAR KWIKPEN) 100 UNIT/ML Inject 20 Units into the skin once daily at bedtime. 15 mL 3  ? levothyroxine (SYNTHROID) 50 MCG tablet TAKE 1 TABLET (50 MCG TOTAL) BY MOUTH DAILY BEFORE BREAKFAST. 30 tablet 4  ? metFORMIN (GLUCOPHAGE) 1000 MG tablet Take 1 tablet (1,000 mg total) by mouth 2 (two) times daily with a meal. 60 tablet 2  ? acetaminophen (TYLENOL) 325 MG tablet Take 2 tablets by mouth every 6 (six) hours as needed. (Patient not taking: Reported on 04/28/2021)    ? Blood Glucose Monitoring Suppl (BLOOD GLUCOSE MONITOR SYSTEM) w/Device KIT Use up to 4 times daily as directed 1 kit 0  ? Elastic Bandages & Supports (ABDOMINAL BINDER/ELASTIC 2XL) MISC 1 each by Does not apply route daily. 1 each 0  ? glucose blood (RIGHTEST GS550 BLOOD GLUCOSE) test strip USED AS DIRECTED UP TO 4 TIMES DAILY. 100 each 0  ? hydrocortisone cream 1 % Apply 1 application topically to the affect area(s) 2 (two) times daily. 28 g 0  ? ibuprofen (ADVIL) 200 MG tablet Take 200 mg by mouth every 6 (six) hours as needed.    ? Insulin Pen Needle 32G X 4 MM MISC Use as directed 100 each 3  ? Rightest GL300 Lancets MISC USE AS  DIRECTED UP TO 4 TIMES DAILY. 100 each 0  ? ?No facility-administered medications prior to visit.  ? ? ?No Known Allergies ? ?ROS ?Review of Systems  ?Constitutional: Negative.   ?HENT: Negative.    ?Eyes: Negative.   ?Respiratory: Negative.    ?Cardiovascular: Negative.   ?Gastrointestinal: Negative.   ?Endocrine: Negative.   ?Genitourinary: Negative.   ?Skin: Negative.   ?Neurological: Negative.   ?Psychiatric/Behavioral: Negative.    ? ?  ?Objective:  ?  ?Physical Exam ?Constitutional:   ?   Appearance: Normal appearance.  ?Cardiovascular:  ?   Rate and Rhythm: Normal rate and regular rhythm.  ?Abdominal:  ?   General: Bowel sounds are normal. There is distension.  ?   Tenderness: There is abdominal tenderness in the right upper quadrant and suprapubic area.  ?  Hernia: A hernia (onthe right lower quardrant) is present. Hernia is present in the right inguinal area.  ?   Comments: Right inguinal hernia   ?Musculoskeletal:     ?   General: Normal range of motion.  ?   Cervical back: Normal range of motion.  ?Skin: ?   General: Skin is warm and dry.  ?Neurological:  ?   Mental Status: She is alert and oriented to person, place, and time.  ?Psychiatric:     ?   Mood and Affect: Mood normal.  ? ? ?BP 109/66 (BP Location: Right Arm, Patient Position: Sitting, Cuff Size: Large)   Pulse 72   Temp 97.9 ?F (36.6 ?C) (Oral)   Ht $R'4\' 11"'QA$  (1.499 m)   Wt 174 lb 14.4 oz (79.3 kg)   LMP 07/21/1997 (Approximate)   SpO2 96%   BMI 35.33 kg/m?  ?Wt Readings from Last 3 Encounters:  ?06/02/21 174 lb 14.4 oz (79.3 kg)  ?04/28/21 178 lb 3.2 oz (80.8 kg)  ?02/03/21 179 lb 9.6 oz (81.5 kg)  ? ? ? ?Health Maintenance Due  ?Topic Date Due  ? COVID-19 Vaccine (1) Never done  ? Hepatitis C Screening  Never done  ? Zoster Vaccines- Shingrix (1 of 2) Never done  ? PAP SMEAR-Modifier  Never done  ? COLONOSCOPY (Pts 45-74yrs Insurance coverage will need to be confirmed)  Never done  ? URINE MICROALBUMIN  07/21/2021  ? ? ?There are no  preventive care reminders to display for this patient. ? ?Lab Results  ?Component Value Date  ? TSH 1.930 01/26/2021  ? ?Lab Results  ?Component Value Date  ? WBC 5.3 07/21/2020  ? HGB 16.2 (H) 07/21/2020  ? HCT 48.5 (H

## 2021-06-16 ENCOUNTER — Telehealth: Payer: Self-pay | Admitting: Pharmacy Technician

## 2021-06-16 NOTE — Telephone Encounter (Signed)
Received updated proof of income.  Patient eligible to receive medication assistance at [facility] until time for re-certification in 2024, and as long as eligibility requirements continue to be met. ? ?Kristen Aguirre ?Care Manager ?[facility]  ?

## 2021-06-23 ENCOUNTER — Other Ambulatory Visit: Payer: Self-pay

## 2021-06-27 ENCOUNTER — Other Ambulatory Visit: Payer: Self-pay

## 2021-06-28 ENCOUNTER — Other Ambulatory Visit: Payer: Self-pay

## 2021-07-27 ENCOUNTER — Other Ambulatory Visit: Payer: Self-pay

## 2021-07-27 ENCOUNTER — Encounter: Payer: Self-pay | Admitting: Gerontology

## 2021-07-27 ENCOUNTER — Ambulatory Visit: Payer: Self-pay | Admitting: Gerontology

## 2021-07-27 VITALS — BP 138/77 | HR 67 | Temp 98.0°F | Resp 16 | Ht 59.0 in | Wt 177.0 lb

## 2021-07-27 DIAGNOSIS — N6320 Unspecified lump in the left breast, unspecified quadrant: Secondary | ICD-10-CM | POA: Insufficient documentation

## 2021-07-27 DIAGNOSIS — N6322 Unspecified lump in the left breast, upper inner quadrant: Secondary | ICD-10-CM

## 2021-07-27 DIAGNOSIS — L209 Atopic dermatitis, unspecified: Secondary | ICD-10-CM

## 2021-07-27 DIAGNOSIS — E1165 Type 2 diabetes mellitus with hyperglycemia: Secondary | ICD-10-CM

## 2021-07-27 DIAGNOSIS — Z8639 Personal history of other endocrine, nutritional and metabolic disease: Secondary | ICD-10-CM

## 2021-07-27 LAB — POCT GLYCOSYLATED HEMOGLOBIN (HGB A1C): Hemoglobin A1C: 7.5 % — AB (ref 4.0–5.6)

## 2021-07-27 LAB — GLUCOSE, POCT (MANUAL RESULT ENTRY): POC Glucose: 128 mg/dl — AB (ref 70–99)

## 2021-07-27 MED ORDER — METFORMIN HCL 1000 MG PO TABS
1000.0000 mg | ORAL_TABLET | Freq: Two times a day (BID) | ORAL | 2 refills | Status: DC
  Filled 2021-07-27: qty 60, 30d supply, fill #0
  Filled 2021-09-01: qty 60, 30d supply, fill #1

## 2021-07-27 MED ORDER — LEVOTHYROXINE SODIUM 50 MCG PO TABS
ORAL_TABLET | Freq: Every day | ORAL | 4 refills | Status: DC
  Filled 2021-07-27: qty 30, 30d supply, fill #0
  Filled 2021-09-01: qty 30, 30d supply, fill #1
  Filled 2021-10-11: qty 30, 30d supply, fill #2
  Filled 2021-11-11: qty 30, 30d supply, fill #3
  Filled 2021-12-14: qty 30, 30d supply, fill #4

## 2021-07-27 MED ORDER — BASAGLAR KWIKPEN 100 UNIT/ML ~~LOC~~ SOPN
24.0000 [IU] | PEN_INJECTOR | Freq: Every day | SUBCUTANEOUS | 3 refills | Status: DC
  Filled 2021-07-27: qty 15, 62d supply, fill #0

## 2021-07-27 MED ORDER — HYDROCORTISONE 2.5 % EX CREA
1.0000 "application " | TOPICAL_CREAM | Freq: Two times a day (BID) | CUTANEOUS | 0 refills | Status: DC
  Filled 2021-07-27: qty 28, 15d supply, fill #0

## 2021-07-27 NOTE — Progress Notes (Signed)
Established Patient Office Visit  Subjective   Patient ID: Kristen Aguirre, female    DOB: May 08, 1965  Age: 56 y.o. MRN: [id number]  Chief Complaint  Patient presents with   Follow-up   Diabetes    HPI  Kristen Aguirre  is a 56 year old female who has history of type 2 diabetes, thyroid disease, hyperlipidemia presents for follow up, lab review and medication refill. Her blood glucose was 128 mg/dl during visit. Her HgbA1c decreased from 9.8% to 7.5%. She checks her blood glucose bid, every 3 days, she states that her fasting readings ranges 130-140 mg/dl.  She states that she is compliant with her medications, denies side effects and continues to make healthy lifestyle changes.  She denies hypo-/hyperglycemia, peripheral neuropathy and performs daily foot check.  She c/o painful lump to inner quadrant of her left breast  around 11 o'clock axis. She states that she noticed it 2 months ago.  She had her annual mammogram December 2022 and it was normal.  She was seen at [facility] general surgery clinic on 06/23/2021 with regards to her lumbar hernia.  She was advised that her hemoglobin A1c needs to be less than 7% and she will follow-up in 3 months.  Overall, she states that she is doing well and offers no further complaint.  Review of Systems  Constitutional: Negative.   Eyes: Negative.   Respiratory: Negative.    Cardiovascular: Negative.   Skin: Negative.   Neurological: Negative.   Endo/Heme/Allergies: Negative.   Psychiatric/Behavioral: Negative.       Objective:     BP 138/77 (BP Location: Left Arm, Patient Position: Sitting, Cuff Size: Large)   Pulse 67   Temp 98 F (36.7 C) (Oral)   Resp 16   Ht $R'4\' 11"'SN$  (1.499 m)   Wt 177 lb (80.3 kg)   LMP 07/21/1997 (Approximate)   SpO2 96%   BMI 35.75 kg/m  BP Readings from Last 3 Encounters:  07/27/21 138/77  06/02/21 109/66  04/28/21 136/78   Wt Readings from Last 3 Encounters:  07/27/21 177 lb (80.3 kg)  06/02/21  174 lb 14.4 oz (79.3 kg)  04/28/21 178 lb 3.2 oz (80.8 kg)   Encourage weight loss  Physical Exam HENT:     Head: Normocephalic and atraumatic.     Mouth/Throat:     Mouth: Mucous membranes are moist.  Eyes:     Extraocular Movements: Extraocular movements intact.     Conjunctiva/sclera: Conjunctivae normal.     Pupils: Pupils are equal, round, and reactive to light.  Cardiovascular:     Rate and Rhythm: Normal rate and regular rhythm.     Pulses: Normal pulses.     Heart sounds: Normal heart sounds.  Pulmonary:     Effort: Pulmonary effort is normal.     Breath sounds: Normal breath sounds.  Skin:    General: Skin is warm.  Neurological:     General: No focal deficit present.     Mental Status: She is alert and oriented to person, place, and time. Mental status is at baseline.  Psychiatric:        Mood and Affect: Mood normal.        Behavior: Behavior normal.        Thought Content: Thought content normal.        Judgment: Judgment normal.     Results for orders placed or performed in visit on 07/27/21  POCT Glucose (CBG)  Result Value Ref Range   POC  Glucose 128 (A) 70 - 99 mg/dl  POCT HgB A1C  Result Value Ref Range   Hemoglobin A1C 7.5 (A) 4.0 - 5.6 %   HbA1c POC (<> result, manual entry)     HbA1c, POC (prediabetic range)     HbA1c, POC (controlled diabetic range)      Last CBC Lab Results  Component Value Date   WBC 5.3 07/21/2020   HGB 16.2 (H) 07/21/2020   HCT 48.5 (H) 07/21/2020   MCV 96 07/21/2020   MCH 32.1 07/21/2020   RDW 13.0 07/21/2020   PLT 176 87/56/4332   Last metabolic panel Lab Results  Component Value Date   GLUCOSE 327 (H) 07/21/2020   NA 136 07/21/2020   K 4.2 07/21/2020   CL 97 07/21/2020   CO2 25 07/21/2020   BUN 10 07/21/2020   CREATININE 0.70 07/21/2020   EGFR 103 07/21/2020   CALCIUM 9.1 07/21/2020   PHOS 2.7 11/19/2019   PROT 7.9 07/21/2020   ALBUMIN 4.5 07/21/2020   LABGLOB 3.4 07/21/2020   AGRATIO 1.3 07/21/2020    BILITOT 0.6 07/21/2020   ALKPHOS 133 (H) 07/21/2020   AST 27 07/21/2020   ALT 28 07/21/2020   ANIONGAP 11 01/13/2020   Last lipids Lab Results  Component Value Date   CHOL 168 10/27/2020   HDL 53 10/27/2020   LDLCALC 95 10/27/2020   TRIG 111 10/27/2020   CHOLHDL 3.2 10/27/2020   Last hemoglobin A1c Lab Results  Component Value Date   HGBA1C 7.5 (A) 07/27/2021   Last thyroid functions Lab Results  Component Value Date   TSH 1.930 01/26/2021      The 10-year ASCVD risk score (Arnett DK, et al., 2019) is: 3.4%    Assessment & Plan:   1. Type 2 diabetes mellitus with hyperglycemia, with long-term current use of insulin (HCC) -Her diabetes is improving and her hemoglobin A1c was 7.5%.  Her Basaglar was increased to 24 units at bedtime.  She was advised to check her fasting blood glucose daily, record and bring log to appointment.  She was advised that her goal should be between 80 and 130 mg per DL.  She was advised to continue on low carbohydrate/no concentrated sweet diet and exercise as tolerated.  She will follow-up with [facility] endocrinology. - POCT HgB A1C; Future - POCT Glucose (CBG); Future - POCT Glucose (CBG) - POCT HgB A1C - metFORMIN (GLUCOPHAGE) 1000 MG tablet; Take 1 tablet (1,000 mg total) by mouth 2 (two) times daily with a meal.  Dispense: 60 tablet; Refill: 2 - Insulin Glargine (BASAGLAR KWIKPEN) 100 UNIT/ML; Inject 24 Units into the skin at bedtime.  Dispense: 15 mL; Refill: 3 - Urine Microalbumin w/creat. ratio  2. History of hypothyroidism -Her TSH is euthyroid and she will continue current medication. - levothyroxine (SYNTHROID) 50 MCG tablet; TAKE 1 TABLET (50 MCG TOTAL) BY MOUTH DAILY BEFORE BREAKFAST.  Dispense: 30 tablet; Refill: 4  3. Atopic dermatitis, unspecified type -We will continue current medication - hydrocortisone 2.5 % cream; Apply 1 application topically 2 (two) times daily.  Dispense: 28 g; Refill: 0  4. Mass of upper inner quadrant  of left breast -She was scheduled mammogram and was advised to go to the emergency room for worsening symptoms. - MM Digital Diagnostic Bilat; Future   Return in about 5 weeks (around 09/01/2021), or if symptoms worsen or fail to improve.    Zafar Debrosse Jerold Coombe, NP

## 2021-07-27 NOTE — Progress Notes (Signed)
Patient seen with the help of [employer], id#

## 2021-07-27 NOTE — Patient Instructions (Signed)
Recuento de carbohidratos para la diabetes mellitus en los adultos Carbohydrate Counting for Diabetes Mellitus, Adult El recuento de carbohidratos es un mtodo para llevar un registro de la cantidad de carbohidratos que se ingieren. La ingesta de carbohidratos aumenta la cantidad de azcar (glucosa) en la sangre. El recuento de la cantidad de carbohidratos que ingiere mejora el control de su glucemia. Esto, a su vez, le ayuda a controlar su diabetes. Los carbohidratos se miden en gramos (g) por porcin. Es importante saber la cantidad de carbohidratos (en gramos o por tamao de porcin) que se puede ingerir en cada comida. Esto es diferente en cada persona. Un nutricionista puede ayudarlo a crear un plan de alimentacin y a calcular la cantidad de carbohidratos que debe ingerir en cada comida y colacin. Qu alimentos contienen carbohidratos? Los siguientes alimentos incluyen carbohidratos: Granos, como panes y cereales. Frijoles secos y productos con soja. Verduras con almidn, como papas, guisantes y maz. Frutas y jugos de frutas. Leche y yogur. Dulces y colaciones, como pasteles, galletas, caramelos, papas fritas de bolsa y refrescos. Cmo se calculan los carbohidratos de los alimentos? Hay dos maneras de calcular los carbohidratos de los alimentos. Puede leer las etiquetas de los alimentos o aprender cules son los tamaos de las porciones estndar de los alimentos. Puede usar cualquiera de estos mtodos o una combinacin de ambos. Usar la etiqueta de informacin nutricional La lista de Informacin nutricional est incluida en las etiquetas de casi todas las bebidas y los alimentos envasados de los [place]. Esto incluye lo siguiente: El tamao de la porcin. Informacin sobre los nutrientes de cada porcin, incluidos los gramos de carbohidratos por porcin. Para usar la Informacin nutricional, decida cuntas porciones tomar. Luego, multiplique el nmero de porciones por la cantidad  de carbohidratos por porcin. El nmero resultante es la cantidad total de carbohidratos que comer. Conocer los tamaos de las porciones estndar de los alimentos Cuando coma alimentos que contengan carbohidratos y que no estn envasados o no incluyan la informacin nutricional en la etiqueta, debe medir las porciones para poder calcular los gramos de carbohidratos. Mida los alimentos que comer con una balanza de alimentos o una taza medidora, si es necesario. Decida cuntas porciones de tamao estndar comer. Multiplique el nmero de porciones por 15. En los alimentos que contienen carbohidratos, una porcin equivale a 15 g de carbohidratos. Por ejemplo, si come 2 tazas o 10 onzas (300 g) de fresas, habr comido 2 porciones y 30 g de carbohidratos (2 porciones x 15 g = 30 g). En el caso de las comidas que contienen mezclas de ms de un alimento, como las sopas y los guisos, debe calcular los carbohidratos de cada alimento que se incluye. La siguiente lista contiene los tamaos de porciones estndar de los alimentos ricos en carbohidratos ms comunes. Cada una de estas porciones tiene aproximadamente 15 g de carbohidratos: 1 rebanada de pan. 1 tortilla de seis pulgadas (15 cm). ? de taza o 2 onzas (53 g) de arroz o pasta cocidos.  taza o 3 onzas (85 g) de lentejas o frijoles cocidos o enlatados, escurridos y enjuagados.  taza o 3 onzas (85 g) de verduras con almidn, como guisantes, maz o zapallo.  taza o 4 onzas (120 g) de cereal caliente.  taza o 3 onzas (85 g) de papas hervidas o en pur, o  o 3 onzas (85 g) de una papa grande al horno.  taza o 4 onzas fluidas (118 ml) de jugo de frutas. 1 taza u   8 onzas fluidas (237 ml) de leche. 1 unidad pequea o 4 onzas (106 g) de manzana.  unidad o 2 onzas (63 g) de una banana mediana. 1 taza o 5 oz (150 g) de fresas. 3 tazas o 1 oz (28.3 g) de palomitas de maz. Cul sera un ejemplo de recuento de carbohidratos? Para calcular los gramos  de carbohidratos de este ejemplo de comida, siga los pasos que se describen a continuacin. Ejemplo de comida 3 onzas (85 g) de pechugas de pollo. ? de taza o 4 onzas (106 g) de arroz integral.  taza o 3 onzas (85 g) de maz. 1 taza u 8 onzas fluidas (237 ml) de leche. 1 taza o 5 onzas (150 g) de fresas con crema batida sin azcar. Clculo de carbohidratos Identifique los alimentos que contienen carbohidratos: Arroz. Maz. Leche. Fresas. Calcule cuntas porciones come de cada alimento: 2 porciones de arroz. 1 porcin de maz. 1 porcin de leche. 1 porcin de fresas. Multiplique cada nmero de porciones por 15 g: 2 porciones de arroz x 15 g = 30 g. 1 porcin de maz x 15 g = 15 g. 1 porcin de leche x 15 g = 15 g. 1 porcin de fresas x 15 g = 15 g. Sume todas las cantidades para conocer el total de gramos de carbohidratos consumidos: 30 g + 15 g + 15 g + 15 g = 75 g de carbohidratos en total. Consejos para seguir este plan Al ir de compras Elabore un plan de comidas y luego haga una lista de compras. Compre verduras frescas y congeladas, frutas frescas y congeladas, productos lcteos, huevos, frijoles, lentejas y cereales integrales. Fjese en las etiquetas de los alimentos. Elija los alimentos que tengan ms fibra y menos azcar. Evite los alimentos procesados y los alimentos con azcares agregados. Planificacin de las comidas Trate de consumir la misma cantidad de gramos de carbohidratos en cada comida y en cada colacin. Planifique tomar comidas y colaciones regulares y equilibradas. Dnde buscar ms informacin American Diabetes Association (Asociacin Estadounidense de la Diabetes): diabetes.org Centers for Disease Control and Prevention (Centros para el Control y la Prevencin de Enfermedades): cdc.gov Academy of Nutrition and Dietetics (Academia de Nutricin y Diettica): eatright.org Association of Diabetes Care & Education Specialists (Asociacin de Especialistas en  Atencin y Educacin sobre la Diabetes): diabeteseducator.org Resumen El recuento de carbohidratos es un mtodo para llevar un registro de la cantidad de carbohidratos que se ingieren. La ingesta de carbohidratos aumenta la cantidad de azcar (glucosa) en la sangre. El recuento de la cantidad de carbohidratos que ingiere mejora el control de su glucemia. Esto le ayuda a controlar su diabetes. Un nutricionista puede ayudarlo a crear un plan de alimentacin y a calcular la cantidad de carbohidratos que debe ingerir en cada comida y colacin. Esta informacin no tiene como fin reemplazar el consejo del mdico. Asegrese de hacerle al mdico cualquier pregunta que tenga. Document Revised: 10/28/2019 Document Reviewed: 10/28/2019 Elsevier Patient Education  2023 Elsevier Inc.  

## 2021-07-28 LAB — MICROALBUMIN / CREATININE URINE RATIO
Creatinine, Urine: 14.2 mg/dL
Microalb/Creat Ratio: <21 mg/g creat (ref 0–29)
Microalbumin, Urine: <3 ug/mL

## 2021-08-01 ENCOUNTER — Other Ambulatory Visit: Payer: Self-pay

## 2021-08-03 ENCOUNTER — Other Ambulatory Visit: Payer: Self-pay

## 2021-08-05 ENCOUNTER — Other Ambulatory Visit: Payer: Self-pay | Admitting: Gerontology

## 2021-08-05 DIAGNOSIS — N6322 Unspecified lump in the left breast, upper inner quadrant: Secondary | ICD-10-CM

## 2021-08-09 ENCOUNTER — Ambulatory Visit: Payer: Self-pay

## 2021-09-01 ENCOUNTER — Ambulatory Visit: Payer: Self-pay | Admitting: Gerontology

## 2021-09-01 ENCOUNTER — Encounter: Payer: Self-pay | Admitting: Gerontology

## 2021-09-01 ENCOUNTER — Other Ambulatory Visit: Payer: Self-pay

## 2021-09-01 VITALS — BP 109/59 | HR 67 | Temp 98.1°F | Resp 16 | Ht 59.0 in | Wt 181.2 lb

## 2021-09-01 DIAGNOSIS — E1165 Type 2 diabetes mellitus with hyperglycemia: Secondary | ICD-10-CM

## 2021-09-01 DIAGNOSIS — N6322 Unspecified lump in the left breast, upper inner quadrant: Secondary | ICD-10-CM

## 2021-09-01 DIAGNOSIS — Z8639 Personal history of other endocrine, nutritional and metabolic disease: Secondary | ICD-10-CM

## 2021-09-01 DIAGNOSIS — E785 Hyperlipidemia, unspecified: Secondary | ICD-10-CM

## 2021-09-01 NOTE — Progress Notes (Signed)
Patient's visit today was done with [employer], id [phone].

## 2021-09-01 NOTE — Progress Notes (Signed)
Established Patient Office Visit  Subjective   Patient ID: Kristen Aguirre, female    DOB: 1965/08/29  Age: 56 y.o. MRN: [id number]  Chief Complaint  Patient presents with   Follow-up   Diabetes    HPI  Kristen Aguirre  is a 56 year old female who has history of type 2 diabetes, thyroid disease, hyperlipidemia. Her HgbA1c done on 07/27/21 was 7.5%, she checks her blood glucose bid and it's less than 130 mg/dl. She states that she's compliant with her medications, denies side effects and continues to make healthy life style changes. She continues to experience intemittent discomfort to inner upper left breast and has not had mammogram done.  She also followed up with Dr. Sharen Counter general surgery at [facility] on 06/23/21 with regards to her hernia repair, was advised to lose weight, her hemoglobin A1c will be less than 7% before surgery could be done.  Overall, she states that she is doing well and offers no further complaint.  Review of Systems  Constitutional: Negative.   HENT: Negative.    Respiratory: Negative.    Cardiovascular: Negative.   Neurological: Negative.   Endo/Heme/Allergies: Negative.       Objective:     BP (!) 109/59 (BP Location: Right Arm, Patient Position: Sitting, Cuff Size: Large)   Pulse 67   Temp 98.1 F (36.7 C) (Oral)   Resp 16   Ht _0  (1.499 m)   Wt 181 lb 3.2 oz (82.2 kg)   LMP 07/21/1997 (Approximate)   SpO2 97%   BMI 36.60 kg/m  BP Readings from Last 3 Encounters:  09/01/21 (!) 109/59  07/27/21 138/77  06/02/21 109/66   Wt Readings from Last 3 Encounters:  09/01/21 181 lb 3.2 oz (82.2 kg)  07/27/21 177 lb (80.3 kg)  06/02/21 174 lb 14.4 oz (79.3 kg)      Physical Exam HENT:     Head: Normocephalic and atraumatic.     Mouth/Throat:     Mouth: Mucous membranes are moist.  Eyes:     Extraocular Movements: Extraocular movements intact.     Conjunctiva/sclera: Conjunctivae normal.     Pupils: Pupils are equal,  round, and reactive to light.  Cardiovascular:     Rate and Rhythm: Normal rate and regular rhythm.     Pulses: Normal pulses.     Heart sounds: Normal heart sounds.  Pulmonary:     Effort: Pulmonary effort is normal.     Breath sounds: Normal breath sounds.  Skin:    General: Skin is warm.  Neurological:     General: No focal deficit present.     Mental Status: She is alert and oriented to person, place, and time. Mental status is at baseline.  Psychiatric:        Mood and Affect: Mood normal.        Behavior: Behavior normal.        Thought Content: Thought content normal.        Judgment: Judgment normal.      No results found for any visits on 09/01/21.  Last CBC Lab Results  Component Value Date   WBC 5.3 07/21/2020   HGB 16.2 (H) 07/21/2020   HCT 48.5 (H) 07/21/2020   MCV 96 07/21/2020   MCH 32.1 07/21/2020   RDW 13.0 07/21/2020   PLT 176 84/16/6063   Last metabolic panel Lab Results  Component Value Date   GLUCOSE 327 (H) 07/21/2020   NA 136 07/21/2020   K  4.2 07/21/2020   CL 97 07/21/2020   CO2 25 07/21/2020   BUN 10 07/21/2020   CREATININE 0.70 07/21/2020   EGFR 103 07/21/2020   CALCIUM 9.1 07/21/2020   PHOS 2.7 11/19/2019   PROT 7.9 07/21/2020   ALBUMIN 4.5 07/21/2020   LABGLOB 3.4 07/21/2020   AGRATIO 1.3 07/21/2020   BILITOT 0.6 07/21/2020   ALKPHOS 133 (H) 07/21/2020   AST 27 07/21/2020   ALT 28 07/21/2020   ANIONGAP 11 01/13/2020   Last lipids Lab Results  Component Value Date   CHOL 168 10/27/2020   HDL 53 10/27/2020   LDLCALC 95 10/27/2020   TRIG 111 10/27/2020   CHOLHDL 3.2 10/27/2020   Last hemoglobin A1c Lab Results  Component Value Date   HGBA1C 7.5 (A) 07/27/2021   Last thyroid functions Lab Results  Component Value Date   TSH 1.930 01/26/2021      The 10-year ASCVD risk score (Arnett DK, et al., 2019) is: 2.2%    Assessment & Plan:   1. Type 2 diabetes mellitus with hyperglycemia, with long-term current use of  insulin (HCC) -Her hemoglobin A1c was 7.5%, she will continue on current medication, low carbohydrate/non concentrated sweet diet and exercise as tolerated. - HgB A1c; Future - Comp Met (CMET); Future - CBC w/Diff; Future  2. Elevated lipids -She will continue current medication, low-fat/cholesterol diet and we will recheck her lipids. - Lipid panel; Future  3. History of hypothyroidism -She will continue current medication, will recheck her TSH. - TSH; Future  4. Mass of upper inner quadrant of left breast -Cancer center was notified to schedule her mammogram.  She was advised to go to the emergency room for worsening symptoms.   Return in about 2 months (around 11/02/2021), or if symptoms worsen or fail to improve.    Merland Holness Jerold Coombe, NP

## 2021-09-01 NOTE — Patient Instructions (Signed)
Recuento de carbohidratos para la diabetes mellitus en los adultos Carbohydrate Counting for Diabetes Mellitus, Adult El recuento de carbohidratos es un mtodo para llevar un registro de la cantidad de carbohidratos que se ingieren. La ingesta de carbohidratos aumenta la cantidad de azcar (glucosa) en la sangre. El recuento de la cantidad de carbohidratos que ingiere mejora el control de su glucemia. Esto, a su vez, le ayuda a controlar su diabetes. Los carbohidratos se miden en gramos (g) por porcin. Es importante saber la cantidad de carbohidratos (en gramos o por tamao de porcin) que se puede ingerir en cada comida. Esto es diferente en cada persona. Un nutricionista puede ayudarlo a crear un plan de alimentacin y a calcular la cantidad de carbohidratos que debe ingerir en cada comida y colacin. Qu alimentos contienen carbohidratos? Los siguientes alimentos incluyen carbohidratos: Granos, como panes y cereales. Frijoles secos y productos con soja. Verduras con almidn, como papas, guisantes y maz. Frutas y jugos de frutas. Leche y yogur. Dulces y colaciones, como pasteles, galletas, caramelos, papas fritas de bolsa y refrescos. Cmo se calculan los carbohidratos de los alimentos? Hay dos maneras de calcular los carbohidratos de los alimentos. Puede leer las etiquetas de los alimentos o aprender cules son los tamaos de las porciones estndar de los alimentos. Puede usar cualquiera de estos mtodos o una combinacin de ambos. Usar la etiqueta de informacin nutricional La lista de Informacin nutricional est incluida en las etiquetas de casi todas las bebidas y los alimentos envasados de los [place]. Esto incluye lo siguiente: El tamao de la porcin. Informacin sobre los nutrientes de cada porcin, incluidos los gramos de carbohidratos por porcin. Para usar la Informacin nutricional, decida cuntas porciones tomar. Luego, multiplique el nmero de porciones por la cantidad  de carbohidratos por porcin. El nmero resultante es la cantidad total de carbohidratos que comer. Conocer los tamaos de las porciones estndar de los alimentos Cuando coma alimentos que contengan carbohidratos y que no estn envasados o no incluyan la informacin nutricional en la etiqueta, debe medir las porciones para poder calcular los gramos de carbohidratos. Mida los alimentos que comer con una balanza de alimentos o una taza medidora, si es necesario. Decida cuntas porciones de tamao estndar comer. Multiplique el nmero de porciones por 15. En los alimentos que contienen carbohidratos, una porcin equivale a 15 g de carbohidratos. Por ejemplo, si come 2 tazas o 10 onzas (300 g) de fresas, habr comido 2 porciones y 30 g de carbohidratos (2 porciones x 15 g = 30 g). En el caso de las comidas que contienen mezclas de ms de un alimento, como las sopas y los guisos, debe calcular los carbohidratos de cada alimento que se incluye. La siguiente lista contiene los tamaos de porciones estndar de los alimentos ricos en carbohidratos ms comunes. Cada una de estas porciones tiene aproximadamente 15 g de carbohidratos: 1 rebanada de pan. 1 tortilla de seis pulgadas (15 cm). ? de taza o 2 onzas (53 g) de arroz o pasta cocidos.  taza o 3 onzas (85 g) de lentejas o frijoles cocidos o enlatados, escurridos y enjuagados.  taza o 3 onzas (85 g) de verduras con almidn, como guisantes, maz o zapallo.  taza o 4 onzas (120 g) de cereal caliente.  taza o 3 onzas (85 g) de papas hervidas o en pur, o  o 3 onzas (85 g) de una papa grande al horno.  taza o 4 onzas fluidas (118 ml) de jugo de frutas. 1 taza u   8 onzas fluidas (237 ml) de leche. 1 unidad pequea o 4 onzas (106 g) de manzana.  unidad o 2 onzas (63 g) de una banana mediana. 1 taza o 5 oz (150 g) de fresas. 3 tazas o 1 oz (28.3 g) de palomitas de maz. Cul sera un ejemplo de recuento de carbohidratos? Para calcular los gramos  de carbohidratos de este ejemplo de comida, siga los pasos que se describen a continuacin. Ejemplo de comida 3 onzas (85 g) de pechugas de pollo. ? de taza o 4 onzas (106 g) de arroz integral.  taza o 3 onzas (85 g) de maz. 1 taza u 8 onzas fluidas (237 ml) de leche. 1 taza o 5 onzas (150 g) de fresas con crema batida sin azcar. Clculo de carbohidratos Identifique los alimentos que contienen carbohidratos: Arroz. Maz. Leche. Fresas. Calcule cuntas porciones come de cada alimento: 2 porciones de arroz. 1 porcin de maz. 1 porcin de leche. 1 porcin de fresas. Multiplique cada nmero de porciones por 15 g: 2 porciones de arroz x 15 g = 30 g. 1 porcin de maz x 15 g = 15 g. 1 porcin de leche x 15 g = 15 g. 1 porcin de fresas x 15 g = 15 g. Sume todas las cantidades para conocer el total de gramos de carbohidratos consumidos: 30 g + 15 g + 15 g + 15 g = 75 g de carbohidratos en total. Consejos para seguir este plan Al ir de compras Elabore un plan de comidas y luego haga una lista de compras. Compre verduras frescas y congeladas, frutas frescas y congeladas, productos lcteos, huevos, frijoles, lentejas y cereales integrales. Fjese en las etiquetas de los alimentos. Elija los alimentos que tengan ms fibra y menos azcar. Evite los alimentos procesados y los alimentos con azcares agregados. Planificacin de las comidas Trate de consumir la misma cantidad de gramos de carbohidratos en cada comida y en cada colacin. Planifique tomar comidas y colaciones regulares y equilibradas. Dnde buscar ms informacin American Diabetes Association (Asociacin Estadounidense de la Diabetes): diabetes.org Centers for Disease Control and Prevention (Centros para el Control y la Prevencin de Enfermedades): cdc.gov Academy of Nutrition and Dietetics (Academia de Nutricin y Diettica): eatright.org Association of Diabetes Care & Education Specialists (Asociacin de Especialistas en  Atencin y Educacin sobre la Diabetes): diabeteseducator.org Resumen El recuento de carbohidratos es un mtodo para llevar un registro de la cantidad de carbohidratos que se ingieren. La ingesta de carbohidratos aumenta la cantidad de azcar (glucosa) en la sangre. El recuento de la cantidad de carbohidratos que ingiere mejora el control de su glucemia. Esto le ayuda a controlar su diabetes. Un nutricionista puede ayudarlo a crear un plan de alimentacin y a calcular la cantidad de carbohidratos que debe ingerir en cada comida y colacin. Esta informacin no tiene como fin reemplazar el consejo del mdico. Asegrese de hacerle al mdico cualquier pregunta que tenga. Document Revised: 10/28/2019 Document Reviewed: 10/28/2019 Elsevier Patient Education  2023 Elsevier Inc.  

## 2021-09-02 ENCOUNTER — Other Ambulatory Visit: Payer: Self-pay

## 2021-09-06 ENCOUNTER — Other Ambulatory Visit: Payer: Self-pay

## 2021-09-06 DIAGNOSIS — N6320 Unspecified lump in the left breast, unspecified quadrant: Secondary | ICD-10-CM

## 2021-09-16 ENCOUNTER — Ambulatory Visit
Admission: RE | Admit: 2021-09-16 | Discharge: 2021-09-16 | Disposition: A | Discharge status: Home / Self Care | Payer: Self-pay | Source: Ambulatory Visit | Attending: Obstetrics and Gynecology | Admitting: Obstetrics and Gynecology

## 2021-09-16 ENCOUNTER — Other Ambulatory Visit: Payer: Self-pay

## 2021-09-16 ENCOUNTER — Ambulatory Visit
Admission: RE | Admit: 2021-09-16 | Discharge: 2021-09-16 | Disposition: A | Discharge status: Home / Self Care | Payer: Self-pay | Source: Ambulatory Visit | Attending: Gerontology | Admitting: Gerontology

## 2021-09-16 DIAGNOSIS — N6320 Unspecified lump in the left breast, unspecified quadrant: Secondary | ICD-10-CM

## 2021-09-16 DIAGNOSIS — N6322 Unspecified lump in the left breast, upper inner quadrant: Secondary | ICD-10-CM | POA: Insufficient documentation

## 2021-10-11 ENCOUNTER — Other Ambulatory Visit: Payer: Self-pay

## 2021-10-26 ENCOUNTER — Other Ambulatory Visit: Payer: Self-pay

## 2021-11-02 ENCOUNTER — Other Ambulatory Visit: Payer: Self-pay

## 2021-11-02 ENCOUNTER — Telehealth: Payer: Self-pay | Admitting: Gerontology

## 2021-11-02 ENCOUNTER — Encounter: Payer: Self-pay | Admitting: Gerontology

## 2021-11-02 ENCOUNTER — Ambulatory Visit: Payer: Self-pay | Admitting: Gerontology

## 2021-11-02 VITALS — BP 118/72 | HR 64 | Temp 98.2°F | Resp 16 | Ht 59.0 in | Wt 173.4 lb

## 2021-11-02 DIAGNOSIS — E1165 Type 2 diabetes mellitus with hyperglycemia: Secondary | ICD-10-CM

## 2021-11-02 DIAGNOSIS — Z8639 Personal history of other endocrine, nutritional and metabolic disease: Secondary | ICD-10-CM

## 2021-11-02 DIAGNOSIS — L209 Atopic dermatitis, unspecified: Secondary | ICD-10-CM

## 2021-11-02 DIAGNOSIS — E785 Hyperlipidemia, unspecified: Secondary | ICD-10-CM

## 2021-11-02 LAB — POCT GLYCOSYLATED HEMOGLOBIN (HGB A1C): Hemoglobin A1C: 6.7 % — AB (ref 4.0–5.6)

## 2021-11-02 LAB — GLUCOSE, POCT (MANUAL RESULT ENTRY): POC Glucose: 122 mg/dl — AB (ref 70–99)

## 2021-11-02 MED ORDER — ATORVASTATIN CALCIUM 20 MG PO TABS
20.0000 mg | ORAL_TABLET | Freq: Every day | ORAL | 3 refills | Status: DC
  Filled 2021-11-02: qty 30, 30d supply, fill #0
  Filled 2021-12-14: qty 30, 30d supply, fill #1
  Filled 2022-01-17: qty 30, 30d supply, fill #2

## 2021-11-02 MED ORDER — BASAGLAR KWIKPEN 100 UNIT/ML ~~LOC~~ SOPN
14.0000 [IU] | PEN_INJECTOR | Freq: Every day | SUBCUTANEOUS | 3 refills | Status: DC
  Filled 2021-11-02: qty 15, 107d supply, fill #0

## 2021-11-02 MED ORDER — METFORMIN HCL 1000 MG PO TABS
1000.0000 mg | ORAL_TABLET | Freq: Two times a day (BID) | ORAL | 2 refills | Status: DC
  Filled 2021-11-02: qty 60, 30d supply, fill #0
  Filled 2022-01-17: qty 60, 30d supply, fill #1

## 2021-11-02 MED ORDER — HYDROCORTISONE 2.5 % EX CREA
1.0000 | TOPICAL_CREAM | Freq: Two times a day (BID) | CUTANEOUS | 0 refills | Status: DC
  Filled 2021-11-02: qty 30, 30d supply, fill #0

## 2021-11-02 NOTE — Progress Notes (Signed)
Established Patient Office Visit  Subjective   Patient ID: Kristen Aguirre, female    DOB: May 27, 1965  Age: 56 y.o. MRN: [id number]  Chief Complaint  Patient presents with   Follow-up   Diabetes    HPI   Kristen Aguirre  is a 56 year old female who has history of type 2 diabetes, thyroid disease, hyperlipidemia. Her HgbA1c done during visit decreased from 7.5% to 6.7%. She states that she's compliant with her medications, denies side effects and continues to make healthy lifestyle changes. Her Blood glucose was 122 mg/dl during visit. She states that she checks her blood glucose bid, and her fasting reading today  was 106 mg/dl. She denies hypo/hyperglycemic symptoms, peripheral neuropathy and performs daily foot checks. She had mammogram and ultrasound done on 09/16/21 and At the palpable site of concern in the upper inner left breast there are findings consistent with benign fat necrosis, recommended to continue annual screening mammography that's due in December 2023. She will follow up at [facility] for colonoscopy on 12/09/21, and with general surgery with regards to her right abdominal hernia. Overall, she states that she's doing well and offers no further complaint.    Review of Systems  Constitutional: Negative.   Eyes: Negative.   Respiratory: Negative.    Cardiovascular: Negative.   Neurological: Negative.   Endo/Heme/Allergies: Negative.       Objective:     BP 118/72 (BP Location: Right Arm, Patient Position: Sitting, Cuff Size: Large)   Pulse 64   Temp 98.2 F (36.8 C) (Oral)   Resp 16   Ht 4' 11" (1.499 m)   Wt 173 lb 6.4 oz (78.7 kg)   LMP 07/21/1997 (Approximate)   SpO2 96%   BMI 35.02 kg/m  BP Readings from Last 3 Encounters:  11/02/21 118/72  09/01/21 (!) 109/59  07/27/21 138/77   Wt Readings from Last 3 Encounters:  11/02/21 173 lb 6.4 oz (78.7 kg)  09/01/21 181 lb 3.2 oz (82.2 kg)  07/27/21 177 lb (80.3 kg)      Physical  Exam HENT:     Head: Normocephalic and atraumatic.     Mouth/Throat:     Mouth: Mucous membranes are moist.  Eyes:     Extraocular Movements: Extraocular movements intact.     Conjunctiva/sclera: Conjunctivae normal.     Pupils: Pupils are equal, round, and reactive to light.  Cardiovascular:     Rate and Rhythm: Normal rate and regular rhythm.     Pulses: Normal pulses.     Heart sounds: Normal heart sounds.  Pulmonary:     Effort: Pulmonary effort is normal.     Breath sounds: Normal breath sounds.  Skin:    General: Skin is warm.  Neurological:     General: No focal deficit present.     Mental Status: She is alert and oriented to person, place, and time. Mental status is at baseline.  Psychiatric:        Mood and Affect: Mood normal.        Behavior: Behavior normal.        Thought Content: Thought content normal.        Judgment: Judgment normal.      Results for orders placed or performed in visit on 11/02/21  POCT Glucose (CBG)  Result Value Ref Range   POC Glucose 122 (A) 70 - 99 mg/dl  POCT HgB A1C  Result Value Ref Range   Hemoglobin A1C 6.7 (A) 4.0 - 5.6 %  HbA1c POC (<> result, manual entry)     HbA1c, POC (prediabetic range)     HbA1c, POC (controlled diabetic range)      Last CBC Lab Results  Component Value Date   WBC 5.3 07/21/2020   HGB 16.2 (H) 07/21/2020   HCT 48.5 (H) 07/21/2020   MCV 96 07/21/2020   MCH 32.1 07/21/2020   RDW 13.0 07/21/2020   PLT 176 37/90/2409   Last metabolic panel Lab Results  Component Value Date   GLUCOSE 327 (H) 07/21/2020   NA 136 07/21/2020   K 4.2 07/21/2020   CL 97 07/21/2020   CO2 25 07/21/2020   BUN 10 07/21/2020   CREATININE 0.70 07/21/2020   EGFR 103 07/21/2020   CALCIUM 9.1 07/21/2020   PHOS 2.7 11/19/2019   PROT 7.9 07/21/2020   ALBUMIN 4.5 07/21/2020   LABGLOB 3.4 07/21/2020   AGRATIO 1.3 07/21/2020   BILITOT 0.6 07/21/2020   ALKPHOS 133 (H) 07/21/2020   AST 27 07/21/2020   ALT 28  07/21/2020   ANIONGAP 11 01/13/2020   Last lipids Lab Results  Component Value Date   CHOL 168 10/27/2020   HDL 53 10/27/2020   LDLCALC 95 10/27/2020   TRIG 111 10/27/2020   CHOLHDL 3.2 10/27/2020   Last hemoglobin A1c Lab Results  Component Value Date   HGBA1C 6.7 (A) 11/02/2021   Last thyroid functions Lab Results  Component Value Date   TSH 1.930 01/26/2021   Last vitamin D Lab Results  Component Value Date   VD25OH 13.5 (L) 07/21/2020   Last vitamin B12 and Folate No results found for: "VITAMINB12", "FOLATE"    The 10-year ASCVD risk score (Arnett DK, et al., 2019) is: 2.5%    Assessment & Plan:   1. Type 2 diabetes mellitus with hyperglycemia, with long-term current use of insulin (HCC) - Her diabetes is improving, HgbA1c was 6.7%, Her Basaglar ws decreased to 14 units at bedtime, will follow up with [facility] Endocrinology. She was encouraged to continue on low carb/non concentrated sweet diet and exercise as tolerated. - POCT HgB A1C; Future - POCT Glucose (CBG); Future - Insulin Glargine (BASAGLAR KWIKPEN) 100 UNIT/ML; Inject 14 Units into the skin at bedtime.  Dispense: 15 mL; Refill: 3 - metFORMIN (GLUCOPHAGE) 1000 MG tablet; Take 1 tablet (1,000 mg total) by mouth 2 (two) times daily with a meal.  Dispense: 60 tablet; Refill: 2 - CBC w/Diff - Comp Met (CMET) - HgB A1c - POCT Glucose (CBG) - POCT HgB A1C  2. Elevated lipids - She will continue current medication, low fat/cholesterol diet and exercise as tolerated. - atorvastatin (LIPITOR) 20 MG tablet; Take 1 tablet (20 mg total) by mouth daily.  Dispense: 30 tablet; Refill: 3 - Lipid panel  3. Atopic dermatitis, unspecified type - It's improving, will continue on current medication and will recheck at her next visit . - hydrocortisone 2.5 % cream; Apply 1 application topically 2 (two) times daily.  Dispense: 28 g; Refill: 0  4. History of hypothyroidism - Will recheck TSH, and will trate medication  accordingly. - TSH    Return in about 3 months (around 02/01/2022), or if symptoms worsen or fail to improve.    Alixis Harmon Jerold Coombe, NP

## 2021-11-02 NOTE — Progress Notes (Signed)
[employer], id # [id number] for today's visit.

## 2021-11-02 NOTE — Patient Instructions (Signed)
Recuento de carbohidratos para la diabetes mellitus en los adultos Carbohydrate Counting for Diabetes Mellitus, Adult El recuento de carbohidratos es un mtodo para llevar un registro de la cantidad de carbohidratos que se ingieren. La ingesta de carbohidratos aumenta la cantidad de azcar (glucosa) en la sangre. El recuento de la cantidad de carbohidratos que ingiere mejora el control de su glucemia. Esto, a su vez, le ayuda a controlar su diabetes. Los carbohidratos se miden en gramos (g) por porcin. Es importante saber la cantidad de carbohidratos (en gramos o por tamao de porcin) que se puede ingerir en cada comida. Esto es diferente en cada persona. Un nutricionista puede ayudarlo a crear un plan de alimentacin y a calcular la cantidad de carbohidratos que debe ingerir en cada comida y colacin. Qu alimentos contienen carbohidratos? Los siguientes alimentos incluyen carbohidratos: Granos, como panes y cereales. Frijoles secos y productos con soja. Verduras con almidn, como papas, guisantes y maz. Frutas y jugos de frutas. Leche y yogur. Dulces y colaciones, como pasteles, galletas, caramelos, papas fritas de bolsa y refrescos. Cmo se calculan los carbohidratos de los alimentos? Hay dos maneras de calcular los carbohidratos de los alimentos. Puede leer las etiquetas de los alimentos o aprender cules son los tamaos de las porciones estndar de los alimentos. Puede usar cualquiera de estos mtodos o una combinacin de ambos. Usar la etiqueta de informacin nutricional La lista de Informacin nutricional est incluida en las etiquetas de casi todas las bebidas y los alimentos envasados de los [place]. Esto incluye lo siguiente: El tamao de la porcin. Informacin sobre los nutrientes de cada porcin, incluidos los gramos de carbohidratos por porcin. Para usar la Informacin nutricional, decida cuntas porciones tomar. Luego, multiplique el nmero de porciones por la cantidad  de carbohidratos por porcin. El nmero resultante es la cantidad total de carbohidratos que comer. Conocer los tamaos de las porciones estndar de los alimentos Cuando coma alimentos que contengan carbohidratos y que no estn envasados o no incluyan la informacin nutricional en la etiqueta, debe medir las porciones para poder calcular los gramos de carbohidratos. Mida los alimentos que comer con una balanza de alimentos o una taza medidora, si es necesario. Decida cuntas porciones de tamao estndar comer. Multiplique el nmero de porciones por 15. En los alimentos que contienen carbohidratos, una porcin equivale a 15 g de carbohidratos. Por ejemplo, si come 2 tazas o 10 onzas (300 g) de fresas, habr comido 2 porciones y 30 g de carbohidratos (2 porciones x 15 g = 30 g). En el caso de las comidas que contienen mezclas de ms de un alimento, como las sopas y los guisos, debe calcular los carbohidratos de cada alimento que se incluye. La siguiente lista contiene los tamaos de porciones estndar de los alimentos ricos en carbohidratos ms comunes. Cada una de estas porciones tiene aproximadamente 15 g de carbohidratos: 1 rebanada de pan. 1 tortilla de seis pulgadas (15 cm). ? de taza o 2 onzas (53 g) de arroz o pasta cocidos.  taza o 3 onzas (85 g) de lentejas o frijoles cocidos o enlatados, escurridos y enjuagados.  taza o 3 onzas (85 g) de verduras con almidn, como guisantes, maz o zapallo.  taza o 4 onzas (120 g) de cereal caliente.  taza o 3 onzas (85 g) de papas hervidas o en pur, o  o 3 onzas (85 g) de una papa grande al horno.  taza o 4 onzas fluidas (118 ml) de jugo de frutas. 1 taza u   8 onzas fluidas (237 ml) de leche. 1 unidad pequea o 4 onzas (106 g) de manzana.  unidad o 2 onzas (63 g) de una banana mediana. 1 taza o 5 oz (150 g) de fresas. 3 tazas o 1 oz (28.3 g) de palomitas de maz. Cul sera un ejemplo de recuento de carbohidratos? Para calcular los gramos  de carbohidratos de este ejemplo de comida, siga los pasos que se describen a continuacin. Ejemplo de comida 3 onzas (85 g) de pechugas de pollo. ? de taza o 4 onzas (106 g) de arroz integral.  taza o 3 onzas (85 g) de maz. 1 taza u 8 onzas fluidas (237 ml) de leche. 1 taza o 5 onzas (150 g) de fresas con crema batida sin azcar. Clculo de carbohidratos Identifique los alimentos que contienen carbohidratos: Arroz. Maz. Leche. Fresas. Calcule cuntas porciones come de cada alimento: 2 porciones de arroz. 1 porcin de maz. 1 porcin de leche. 1 porcin de fresas. Multiplique cada nmero de porciones por 15 g: 2 porciones de arroz x 15 g = 30 g. 1 porcin de maz x 15 g = 15 g. 1 porcin de leche x 15 g = 15 g. 1 porcin de fresas x 15 g = 15 g. Sume todas las cantidades para conocer el total de gramos de carbohidratos consumidos: 30 g + 15 g + 15 g + 15 g = 75 g de carbohidratos en total. Consejos para seguir este plan Al ir de compras Elabore un plan de comidas y luego haga una lista de compras. Compre verduras frescas y congeladas, frutas frescas y congeladas, productos lcteos, huevos, frijoles, lentejas y cereales integrales. Fjese en las etiquetas de los alimentos. Elija los alimentos que tengan ms fibra y menos azcar. Evite los alimentos procesados y los alimentos con azcares agregados. Planificacin de las comidas Trate de consumir la misma cantidad de gramos de carbohidratos en cada comida y en cada colacin. Planifique tomar comidas y colaciones regulares y equilibradas. Dnde buscar ms informacin American Diabetes Association (Asociacin Estadounidense de la Diabetes): diabetes.org Centers for Disease Control and Prevention (Centros para el Control y la Prevencin de Enfermedades): cdc.gov Academy of Nutrition and Dietetics (Academia de Nutricin y Diettica): eatright.org Association of Diabetes Care & Education Specialists (Asociacin de Especialistas en  Atencin y Educacin sobre la Diabetes): diabeteseducator.org Resumen El recuento de carbohidratos es un mtodo para llevar un registro de la cantidad de carbohidratos que se ingieren. La ingesta de carbohidratos aumenta la cantidad de azcar (glucosa) en la sangre. El recuento de la cantidad de carbohidratos que ingiere mejora el control de su glucemia. Esto le ayuda a controlar su diabetes. Un nutricionista puede ayudarlo a crear un plan de alimentacin y a calcular la cantidad de carbohidratos que debe ingerir en cada comida y colacin. Esta informacin no tiene como fin reemplazar el consejo del mdico. Asegrese de hacerle al mdico cualquier pregunta que tenga. Document Revised: 10/28/2019 Document Reviewed: 10/28/2019 Elsevier Patient Education  2023 Elsevier Inc.  

## 2021-11-02 NOTE — Telephone Encounter (Signed)
Nurse from [facility] general surgery returned my phone call for pt regarding a follow up appt to be scheduled after Oct 20. Nurse will have to have doc add her in. Stated they will call pt to schedule appt. Verbalized understanding.

## 2021-11-03 ENCOUNTER — Other Ambulatory Visit: Payer: Self-pay

## 2021-11-03 LAB — COMPREHENSIVE METABOLIC PANEL
ALT: 29 IU/L (ref 0–32)
AST: 34 IU/L (ref 0–40)
Albumin/Globulin Ratio: 1.7 (ref 1.2–2.2)
Albumin: 4.3 g/dL (ref 3.8–4.9)
Alkaline Phosphatase: 75 IU/L (ref 44–121)
BUN/Creatinine Ratio: 11 (ref 9–23)
BUN: 6 mg/dL (ref 6–24)
Bilirubin Total: 0.4 mg/dL (ref 0.0–1.2)
CO2: 27 mmol/L (ref 20–29)
Calcium: 9.4 mg/dL (ref 8.7–10.2)
Chloride: 99 mmol/L (ref 96–106)
Creatinine, Ser: 0.56 mg/dL — ABNORMAL LOW (ref 0.57–1.00)
Globulin, Total: 2.5 g/dL (ref 1.5–4.5)
Glucose: 127 mg/dL — ABNORMAL HIGH (ref 70–99)
Potassium: 4.5 mmol/L (ref 3.5–5.2)
Sodium: 140 mmol/L (ref 134–144)
Total Protein: 6.8 g/dL (ref 6.0–8.5)
eGFR: 108 mL/min/{1.73_m2} (ref >59)

## 2021-11-03 LAB — LIPID PANEL
Chol/HDL Ratio: 2.6 ratio (ref 0.0–4.4)
Cholesterol, Total: 142 mg/dL (ref 100–199)
HDL: 54 mg/dL (ref >39)
LDL Chol Calc (NIH): 68 mg/dL (ref 0–99)
Triglycerides: 108 mg/dL (ref 0–149)
VLDL Cholesterol Cal: 20 mg/dL (ref 5–40)

## 2021-11-03 LAB — CBC WITH DIFFERENTIAL/PLATELET
Basophils Absolute: 0 10*3/uL (ref 0.0–0.2)
Basos: 1 %
EOS (ABSOLUTE): 0.1 10*3/uL (ref 0.0–0.4)
Eos: 2 %
Hematocrit: 42 % (ref 34.0–46.6)
Hemoglobin: 14 g/dL (ref 11.1–15.9)
Immature Grans (Abs): 0 10*3/uL (ref 0.0–0.1)
Immature Granulocytes: 1 %
Lymphocytes Absolute: 1.4 10*3/uL (ref 0.7–3.1)
Lymphs: 24 %
MCH: 31.5 pg (ref 26.6–33.0)
MCHC: 33.3 g/dL (ref 31.5–35.7)
MCV: 94 fL (ref 79–97)
Monocytes Absolute: 0.3 10*3/uL (ref 0.1–0.9)
Monocytes: 5 %
Neutrophils Absolute: 4.1 10*3/uL (ref 1.4–7.0)
Neutrophils: 67 %
Platelets: 201 10*3/uL (ref 150–450)
RBC: 4.45 x10E6/uL (ref 3.77–5.28)
RDW: 11.8 % (ref 11.7–15.4)
WBC: 6 10*3/uL (ref 3.4–10.8)

## 2021-11-03 LAB — HEMOGLOBIN A1C
Est. average glucose Bld gHb Est-mCnc: 151 mg/dL
Hgb A1c MFr Bld: 6.9 % — ABNORMAL HIGH (ref 4.8–5.6)

## 2021-11-03 LAB — TSH: TSH: 1.93 u[IU]/mL (ref 0.450–4.500)

## 2021-11-11 ENCOUNTER — Other Ambulatory Visit: Payer: Self-pay

## 2021-12-06 ENCOUNTER — Ambulatory Visit: Payer: Self-pay

## 2021-12-14 ENCOUNTER — Other Ambulatory Visit: Payer: Self-pay

## 2021-12-15 ENCOUNTER — Telehealth: Payer: Self-pay | Admitting: Emergency Medicine

## 2021-12-15 NOTE — Telephone Encounter (Signed)
Called patient with the help of interpreter services, id# [phone]. Advised patient that she would need to call Dr Georgia Duff office at [facility] [phone]) and schedule a follow up appointment. Patient agreed and voiced understanding.

## 2021-12-15 NOTE — Telephone Encounter (Signed)
-----   Message from Renie Ora sent at 12/15/2021  1:33 PM EDT ----- Regarding: Follow-up for surgery question [name] had a colonoscopy done and received results. She has a question regarding scheduling surgery for her hernia.

## 2022-01-17 ENCOUNTER — Other Ambulatory Visit: Payer: Self-pay

## 2022-01-17 ENCOUNTER — Other Ambulatory Visit: Payer: Self-pay | Admitting: Gerontology

## 2022-01-17 DIAGNOSIS — Z8639 Personal history of other endocrine, nutritional and metabolic disease: Secondary | ICD-10-CM

## 2022-01-17 MED ORDER — LEVOTHYROXINE SODIUM 50 MCG PO TABS
ORAL_TABLET | Freq: Every day | ORAL | 4 refills | Status: DC
  Filled 2022-01-17: qty 30, 30d supply, fill #0

## 2022-01-25 ENCOUNTER — Other Ambulatory Visit: Payer: Self-pay

## 2022-02-01 ENCOUNTER — Ambulatory Visit: Payer: Self-pay | Admitting: Gerontology

## 2022-02-01 ENCOUNTER — Encounter: Payer: Self-pay | Admitting: Gerontology

## 2022-02-01 ENCOUNTER — Other Ambulatory Visit: Payer: Self-pay

## 2022-02-01 VITALS — BP 118/79 | HR 66 | Wt 175.0 lb

## 2022-02-01 DIAGNOSIS — E1165 Type 2 diabetes mellitus with hyperglycemia: Secondary | ICD-10-CM

## 2022-02-01 DIAGNOSIS — R222 Localized swelling, mass and lump, trunk: Secondary | ICD-10-CM

## 2022-02-01 DIAGNOSIS — K5900 Constipation, unspecified: Secondary | ICD-10-CM

## 2022-02-01 DIAGNOSIS — Z8639 Personal history of other endocrine, nutritional and metabolic disease: Secondary | ICD-10-CM

## 2022-02-01 DIAGNOSIS — E785 Hyperlipidemia, unspecified: Secondary | ICD-10-CM

## 2022-02-01 LAB — POCT GLYCOSYLATED HEMOGLOBIN (HGB A1C): Hemoglobin A1C: 8.3 % — AB (ref 4.0–5.6)

## 2022-02-01 LAB — GLUCOSE, POCT (MANUAL RESULT ENTRY): POC Glucose: 199 mg/dl — AB (ref 70–99)

## 2022-02-01 MED ORDER — LEVOTHYROXINE SODIUM 50 MCG PO TABS
ORAL_TABLET | Freq: Every day | ORAL | 4 refills | Status: DC
  Filled 2022-02-01: qty 30, 30d supply, fill #0
  Filled 2022-03-06: qty 30, 30d supply, fill #1
  Filled 2022-04-07: qty 30, 30d supply, fill #2
  Filled 2022-05-12: qty 30, 30d supply, fill #3
  Filled 2022-06-16: qty 30, 30d supply, fill #4

## 2022-02-01 MED ORDER — METFORMIN HCL 1000 MG PO TABS
1000.0000 mg | ORAL_TABLET | Freq: Two times a day (BID) | ORAL | 2 refills | Status: DC
  Filled 2022-02-01 – 2022-05-12 (×2): qty 60, 30d supply, fill #0

## 2022-02-01 MED ORDER — DOCUSATE SODIUM 100 MG PO CAPS
100.0000 mg | ORAL_CAPSULE | Freq: Every day | ORAL | 2 refills | Status: DC
  Filled 2022-02-01: qty 30, 30d supply, fill #0

## 2022-02-01 MED ORDER — ATORVASTATIN CALCIUM 20 MG PO TABS
20.0000 mg | ORAL_TABLET | Freq: Every day | ORAL | 3 refills | Status: DC
  Filled 2022-02-01 – 2022-03-06 (×2): qty 30, 30d supply, fill #0
  Filled 2022-04-07: qty 30, 30d supply, fill #1
  Filled 2022-05-12: qty 30, 30d supply, fill #2
  Filled 2022-06-16: qty 30, 30d supply, fill #3

## 2022-02-01 NOTE — Progress Notes (Signed)
Established Patient Office Visit  Subjective   Patient ID: Kristen Aguirre, female    DOB: Jul 15, 1965  Age: 56 y.o. MRN: [id number]  No chief complaint on file.   Medication Refill Associated symptoms include abdominal pain.    Kristen Aguirre  is a 56 year old female who has history of type 2 diabetes, thyroid disease, hyperlipidemia. Her HgbA1c was done during visit which has increased from 6.7% to 8.3%. Her blood glucose is 199 mg/dL during visit. She states that she been  monitoring her blood glucose BID and it runs approximately 111-113 mg/dl. She denies hypo/hyperglycemic symptoms and peripheral neuropathy. She has an appointment scheduled with general surgery on 06/15/2022 for her right abdominal hernia. She states that she is experiincing worsening abdominal pain at night and rates it a 8/10 on the pain scale which decreases after taking Tylenol. She states that her abdominal pain is currently an a 3/10 on the pain scale. She states that at times she experiences constipation, denies nausea and vomitting. She stated that her last bowel movement was om 01/30/2022  Pt denies polydipsia, polyphagia, and polyuria. She states that she has also been sick with a cold for a week. She reports that she has been eating a lot of soups, lentils, and drinking a lot of hot chocolate due to her cold and has intermittent headaches since she has been sick. She stated that she  has not been tested for COVID and denies being febrile or fatigued, and states that she has not been around anyone that has not been sick. She states that her symptoms has improved  80% and  overall, she states that she's doing well and offer no further complaint.  Review of Systems  Constitutional: Negative.   Eyes: Negative.   Cardiovascular: Negative.   Gastrointestinal:  Positive for abdominal pain.       Pt stated that she is experienicing abdominal pain due to the abdominal hernia and rates it at a / during  today's visit.   Genitourinary: Negative.   Musculoskeletal: Negative.   Skin: Negative.   Neurological: Negative.   Endo/Heme/Allergies: Negative.   Psychiatric/Behavioral: Negative.        Objective:    Headache  LMP 07/21/1997 (Approximate)  BP Readings from Last 3 Encounters:  02/01/22 118/79  11/02/21 118/72  09/01/21 (!) 109/59   Wt Readings from Last 3 Encounters:  02/01/22 175 lb (79.4 kg)  11/02/21 173 lb 6.4 oz (78.7 kg)  09/01/21 181 lb 3.2 oz (82.2 kg)      Physical Exam Constitutional:      Appearance: Normal appearance. She is normal weight.  HENT:     Head: Normocephalic and atraumatic.     Mouth/Throat:     Mouth: Mucous membranes are moist.     Pharynx: Oropharynx is clear.  Eyes:     Conjunctiva/sclera: Conjunctivae normal.  Cardiovascular:     Rate and Rhythm: Normal rate and regular rhythm.     Heart sounds: Normal heart sounds.  Pulmonary:     Effort: Pulmonary effort is normal.     Breath sounds: Normal breath sounds.  Abdominal:     General: Bowel sounds are normal.     Palpations: Abdomen is soft.     Hernia: A hernia is present.     Comments: Abdominal hernia present on R side.   Genitourinary:    Rectum: Normal.  Musculoskeletal:        General: Normal range of motion.  Skin:  General: Skin is warm and dry.  Neurological:     General: No focal deficit present.     Mental Status: She is alert and oriented to person, place, and time. Mental status is at baseline.  Psychiatric:        Mood and Affect: Mood normal.      No results found for any visits on 02/01/22.  Last CBC Lab Results  Component Value Date   WBC 6.0 11/02/2021   HGB 14.0 11/02/2021   HCT 42.0 11/02/2021   MCV 94 11/02/2021   MCH 31.5 11/02/2021   RDW 11.8 11/02/2021   PLT 201 24/40/1027   Last metabolic panel Lab Results  Component Value Date   GLUCOSE 127 (H) 11/02/2021   NA 140 11/02/2021   K 4.5 11/02/2021   CL 99 11/02/2021   CO2 27  11/02/2021   BUN 6 11/02/2021   CREATININE 0.56 (L) 11/02/2021   EGFR 108 11/02/2021   CALCIUM 9.4 11/02/2021   PHOS 2.7 11/19/2019   PROT 6.8 11/02/2021   ALBUMIN 4.3 11/02/2021   LABGLOB 2.5 11/02/2021   AGRATIO 1.7 11/02/2021   BILITOT 0.4 11/02/2021   ALKPHOS 75 11/02/2021   AST 34 11/02/2021   ALT 29 11/02/2021   ANIONGAP 11 01/13/2020   Last lipids Lab Results  Component Value Date   CHOL 142 11/02/2021   HDL 54 11/02/2021   LDLCALC 68 11/02/2021   TRIG 108 11/02/2021   CHOLHDL 2.6 11/02/2021   Last hemoglobin A1c Lab Results  Component Value Date   HGBA1C 6.7 (A) 11/02/2021   Last thyroid functions Lab Results  Component Value Date   TSH 1.930 11/02/2021      The 10-year ASCVD risk score (Arnett DK, et al., 2019) is: 1.7%    Assessment & Plan:  1. Type 2 diabetes mellitus with hyperglycemia, with long-term current use of insulin (HCC) - Her HgB A1C was 8.3%, she will continue on her current medication, low carbohydrate/ non concentrated sweet diet and exercise as tolerated. She was advised to check her blood glucose tid, record and bring log to follow up appointment. Her fasting reading should be between 80-130 mg/dl. - POCT HgB A1C; Future - POCT Glucose (CBG); Future - metFORMIN (GLUCOPHAGE) 1000 MG tablet; Take 1 tablet (1,000 mg total) by mouth 2 (two) times daily with a meal.  Dispense: 60 tablet; Refill: 2  2. Elevated lipids - She will continue her current medication, low-fat/ cholesterol diet and we will recheck her lipids. - atorvastatin (LIPITOR) 20 MG tablet; Take 1 tablet (20 mg total) by mouth daily.  Dispense: 30 tablet; Refill: 3  3. Abdominal wall lump - She will follow up with the general surgery to request an earlier appointment or she will go the ED immediately for worsening symptoms.   4. History of hypothyroidism - She will continue current medication, we will recheck her TSH.  - levothyroxine (SYNTHROID) 50 MCG tablet; TAKE 1 TABLET  (50 MCG TOTAL) BY MOUTH DAILY BEFORE BREAKFAST.  Dispense: 30 tablet; Refill: 4  5. Constipation, unspecified constipation type - She was started on docusate 100 MG for constipation. She was educated on medication side effects and advised to notify clinic. She was encouraged to continue on high fiber diet, increase water intake and exercise as tolerated. - docusate sodium (COLACE) 100 MG capsule; Take 1 capsule (100 mg total) by mouth daily.  Dispense: 30 capsule; Refill: 2     F/U in 3 months on or around (05/03/2022).  Sharon Mt, FNP student.

## 2022-02-01 NOTE — Patient Instructions (Addendum)
Estreimiento, en adultos Constipation, Adult Estreimiento significa que una persona hace menos de tres deposiciones en una semana, tiene dificultades para defecar o las heces (deposiciones) son secas, duras o ms grandes de lo normal. La causa puede ser una afeccin subyacente. Puede empeorar con la edad si una persona toma ciertos medicamentos y no toma suficiente lquido. Siga estas instrucciones en su casa: Comida y bebida  Consuma alimentos con alto contenido de [place], como frijoles, cereales integrales, y frutas y verduras frescas. Limite el consumo de alimentos que sean bajos en fibra y ricos en grasas y azcares procesados, como los fritos y los dulces. Estos incluyen patatas fritas, hamburguesas, galletas, dulces y refrescos. Beba suficiente lquido como para Pharmacologistmantener la orina de color amarillo plido. Instrucciones generales Haga actividad fsica habitualmente o como se lo haya indicado el mdico. Intente practicar 150 minutos de actividad fsica moderada por semana. Vaya al bao siempre que sienta ganas de defecar. No se aguante las ganas. Use los medicamentos de venta libre y los recetados solamente como se lo haya indicado el mdico. Esto incluye suplementos de Garnerfibra. En el momento de hacer sus deposiciones: Respire profundamente mientras relaja la parte inferior del abdomen. Practique la relajacin del suelo plvico. Controle su afeccin para detectar cualquier cambio. Informe a su mdico acerca de cualquier cambio. Concurra a todas las visitas de [address]se[address] como se lo haya indicado el mdico. Esto es importante. Comunquese con un mdico si: Su dolor empeora. Tiene fiebre. No defeca despus de 4 das. Vomita. [place] de [place] Beachpeso o no tiene apetito. Tiene sangrado por la abertura entre las nalgas (ano). Las heces son delgadas como un lpiz. Solicite ayuda de inmediato si: Lance Mussiene fiebre, y los sntomas empeoran repentinamente. Observa que se filtran heces o hay sangre en las  heces. Tiene el abdomen distendido. Siente un dolor intenso en el abdomen. Se siente mareado o se desmaya. Resumen Estreimiento significa que una persona hace menos de tres deposiciones en una semana, tiene dificultades para defecar o las heces (deposiciones) son secas, duras o ms grandes de lo normal. Consuma alimentos con alto contenido de [place] Valleyfibra, como frijoles, cereales integrales, y frutas y verduras frescas. Beba suficiente lquido como para Pharmacologistmantener la orina de color amarillo plido. Use los medicamentos de venta libre y los recetados solamente como se lo haya indicado el mdico. Esto incluye suplementos de Catlinfibra. Esta informacin no tiene Theme park managercomo fin reemplazar el consejo del mdico. Asegrese de hacerle al mdico cualquier pregunta que tenga. Document Revised: 03/14/2019 Document Reviewed: 03/14/2019 Elsevier Patient Education  2023 Elsevier Inc. Plan de alimentacin cardiosaludable Heart-Healthy Eating Plan Muchos factores influyen en la salud cardaca, entre ellos, los hbitos de alimentacin y de ejercicio fsico. La salud del corazn tambin se denomina salud coronaria. El riesgo coronario aumenta cuando hay niveles anormales de grasa (lpidos) en la [place]. Un plan de alimentacin cardiosaludable implica limitar las grasas poco saludables, aumentar las grasas saludables, limitar el consumo de sal (sodio) y hacer otros cambios en la dieta y el estilo de [place]. En qu consiste el plan? El mdico podra recomendarle que haga lo siguiente: Que limite la ingesta de grasas al ________ % o menos del total de caloras por da. Que limite la ingesta de grasas saturadas al _________ % o menos del total de caloras por da. Que limite la cantidad de colesterol en su dieta a menos de _________ mg por da. Que limite la cantidad de sodio en su dieta a menos de _________ mg por da. Cules son algunos  consejos para seguir este plan? Al cocinar Evite frer los alimentos a la hora de la coccin.  Hornear, hervir, grillar y asar a la parrilla son buenas opciones. Otras formas de reducir el consumo de grasas son las siguientes: Quite la piel de las aves. Quite todas las grasas visibles de las carnes. Cocine al vapor las verduras en agua o caldo. Planificacin de las comidas  En las comidas, imagine dividir su plato en cuartos: Llene la mitad del plato con verduras y ensaladas de hojas verdes. Llene un cuarto del plato con cereales integrales. Llene un cuarto del plato con alimentos con protenas magras. Coma de 2 a 4 tazas de Copy. Una taza de verduras equivale a 1 taza (91 g) de brcoli o coliflor, 2 zanahorias medianas, 1 pimiento morrn grande, 1 batata grande, 1 tomate grande, 1 patata blanca mediana, 2 tazas (150 g) de verduras de [employer] crudas. Coma de 1 a 2 tazas de Radiation protection practitioner. Una taza de fruta equivale a 1 manzana pequea, 1 banana grande, 1 taza (237 g) de fruta mixta, 1 naranja grande,  taza (82 g) de fruta seca, 1 taza (240 ml) de jugo de fruta al 100 %. Consuma ms alimentos con fibra soluble. Entre ellos, se incluyen las [place], el [place], las [place], los frijoles, los guisantes y Aeronautical engineer. Trate de consumir de 25 a 30 g de [employer]. Aumente el consumo de legumbres, frutos secos y semillas a 4 o 5 porciones por semana. Una porcin de frijoles secos o legumbres equivale a 1?4 taza (90 g) cocinados, 1 porcin de frutos secos equivale a  oz (12 almendras, 24 pistachos o 7 mitades de nuez) y 1 porcin de semillas equivale a  oz (8 g). Grasas Elija grasas saludables con mayor frecuencia. Elija las grasas monoinsaturadas y [address], como el aceite de oliva y canola, el aceite de [place], la semillas de [place], las nueces, las almendras y las semillas. Consuma ms grasas omega-3. Elija salmn, caballa, sardinas, atn, aceite de lino y semillas de lino molidas. Propngase comer pescado al [employer] veces por semana. Lea las etiquetas de los  alimentos detenidamente para identificar los que contienen grasas trans o altas cantidades de grasas saturadas. Limite el consumo de grasas saturadas. Estas se encuentran en productos de origen animal, como carnes, mantequilla y crema. Las grasas saturadas de origen vegetal incluyen aceite de palma, de palmiste y de coco. Evite los alimentos con aceites parcialmente hidrogenados. Estos contienen grasas trans. [address] [place], se incluyen margarinas en barra, algunas margarinas untables, galletas dulces y [place] y otros productos horneados. Evite las comidas fritas. Informacin general Consuma ms comida casera y menos de restaurante, de bares y comida rpida. Limite o evite el alcohol. Limite los alimentos con alto contenido de International aid/development worker aadido y almidones simples, como los alimentos elaborados con harina blanca refinada (pan blanco, pasteles, dulces). Baje de peso si es necesario. Perder solo del 5 al 10 % de su peso corporal puede ayudarlo a mejorar su estado de salud general y a Education officer, museum, como la diabetes y las enfermedades cardacas. Controle la ingesta de sodio, especialmente si tiene presin arterial alta. Hable con el mdico acerca de cambiar la ingesta de sodio. Intente incorporar ms comidas vegetarianas cada semana. Qu alimentos debo comer? Nils Pyle Nils Pyle frescas, en conserva (en su jugo natural) o frutas congeladas. Verduras Verduras frescas o congeladas (crudas, al vapor, asadas o grilladas). Ensaladas de hojas verdes. Cereales La mayora de los cereales. Elija  casi siempre trigo integral y Radiation protection practitioner. Arroz y pastas, incluido el arroz integral y las pastas elaboradas con trigo integral. Armed forces operational officer y otras protenas [place] magras de res, ternera, cerdo y cordero a las que se les haya quitado la grasa visible. Pollo y pavo sin piel. Todos los pescados y [employer]. Pato salvaje, conejo, faisn y venado. Claras de huevo o sustitutos del huevo bajos en colesterol.  Porotos, guisantes y lentejas secos y tofu. Semillas y la mayora de los frutos secos. Lcteos Quesos descremados y semidescremados, entre ellos, ricota y Garment/textile technologist. Leche descremada o al 1 % (lquida, en polvo o evaporada). Suero de [employer] elaborado con [employer]. Yogur descremado o semidescremado. Grasas y Writer no hidrogenadas (sin grasas trans). Aceites vegetales, incluido el de soja, ssamo, girasol, [place], [place], man, crtamo, maz, canola y semillas de algodn. Alios para ensalada o mayonesa elaborados con aceite vegetal. Halina Andreas (mineral o con gas). T y caf. T helado sin endulzar. Bebidas dietticas. Dulces y [employer], gelatina y helado de frutas. Pequeas cantidades de chocolate amargo. Limite todos los dulces y postres. Alios y [employer] y condimentos. Es posible que los productos que se enumeran ms [place] no constituyan una lista completa de los alimentos y las bebidas que puede tomar. Consulte a un nutricionista para conocer ms opciones. Qu alimentos debo evitar? Nils Pyle Fruta enlatada en almbar espeso. Frutas con salsa de crema o mantequilla. Frutas fritas. Limite el consumo de coco. Verduras Verduras cocinadas con salsas de queso, crema o mantequilla. Verduras fritas. Cereales Panes elaborados con grasas saturadas o trans, aceites o [employer]. Croissants. Panecillos dulces. Rosquillas. Galletas con alto contenido de grasas, como las que galletas de queso y las papas fritas. Carnes y [address] protenas [address] grasas, como perros calientes, [place] de res, salchichas, tocino, asado de Producer, television/film/video o [place]. Fiambres con alto contenido de [place], como salame y Loss adjuster, chartered. Caviar. Pato y ganso domsticos. Vsceras, como hgado. Lcteos Crema, crema agria, queso crema y [place] con crema. Quesos enteros. Leche entera o al 2 % (lquida, evaporada o condensada). Suero de [employer]. Salsa de crema o queso con  alto contenido de [place]. Yogur de [employer]ak. Grasas y [place] de carne o materia grasa. Manteca de cacao, aceites hidrogenados, aceite de palma, aceite de coco, aceite de palmiste. Grasas y [address] grasas slidas, incluida la grasa del tocino, el cerdo [place], la [place] de cerdo y Civil engineer, contracting. Sustitutos no lcteos de la crema. Aderezos para ensalada con queso o crema agria. Bebidas Refrescos regulares y cualquier bebida con agregado de azcar. Dulces y [employer]. Pudin. Galletas dulces. Bizcochuelos. Pasteles. Chocolate con leche o chocolate blanco. Almbares con mantequilla. Helados o bebidas elaboradas con helado con alto contenido de grasas. Es posible que los productos que se enumeran ms arriba no sean una lista completa de los alimentos y las bebidas que se Theatre stage manager. Consulte a un nutricionista para obtener ms informacin. Resumen La planificacin de comidas cardiosaludables implica limitar las grasas poco saludables, aumentar las grasas saludables, limitar el consumo de sal (sodio) y Radio producer otros cambios en la dieta y el estilo de [place]. Baje de peso si es necesario. Perder solo del 5 al 10 % de su peso corporal puede ayudarlo a mejorar su estado de salud general y a Education officer, museum, como la diabetes y las enfermedades cardacas. Propngase seguir una dieta equilibrada, que incluya frutas y verduras, productos lcteos descremados o semidescremados, protenas magras, frutos secos y legumbres, cereales integrales y Therapist, occupational  y grasas cardiosaludables. Esta informacin no tiene Theme park manager el consejo del mdico. Asegrese de hacerle al mdico cualquier pregunta que tenga. Document Revised: 03/30/2021 Document Reviewed: 03/30/2021 Elsevier Patient Education  2023 Elsevier Inc. Recuento de carbohidratos para la diabetes mellitus en los adultos Carbohydrate Counting for Diabetes Mellitus, Adult El recuento de carbohidratos es un mtodo para llevar un registro  de la cantidad de carbohidratos que se ingieren. La ingesta de carbohidratos aumenta la cantidad de azcar (glucosa) en la sangre. El recuento de la cantidad de carbohidratos que ingiere mejora el control de su glucemia. Esto, a su vez, le ayuda a controlar su diabetes. Los carbohidratos se miden en gramos (g) por porcin. Es importante saber la cantidad de carbohidratos (en gramos o por tamao de porcin) que se puede ingerir en cada comida. Esto es Government social research officer. Un nutricionista puede ayudarlo a crear un plan de alimentacin y a calcular la cantidad de carbohidratos que debe ingerir en cada comida y colacin. Qu alimentos contienen carbohidratos? Los siguientes alimentos incluyen carbohidratos: Granos, como panes y cereales. Frijoles secos y productos con soja. Verduras con almidn, como papas, guisantes y maz. Nils Pyle y jugos de frutas. Leche y Dentist. Dulces y colaciones, como pasteles, galletas, caramelos, papas fritas de bolsa y refrescos. Cmo se calculan los carbohidratos de los alimentos? Hay dos maneras de calcular los carbohidratos de los alimentos. Puede leer las etiquetas de los alimentos o aprender cules son los tamaos de las porciones estndar de los alimentos. Puede usar cualquiera de estos mtodos o una combinacin de [place]. Usar la Air cabin crew de informacin nutricional La lista de Informacin nutricional est incluida en las etiquetas de casi todas las bebidas y los alimentos envasados de los [place]. Esto incluye lo siguiente: El tamao de la porcin. Informacin sobre los nutrientes de cada porcin, incluidos los gramos de carbohidratos por porcin. Para usar la Informacin nutricional, decida cuntas porciones tomar. Luego, multiplique el nmero de porciones por la cantidad de carbohidratos por porcin. El nmero resultante es la cantidad total de carbohidratos que comer. Conocer los tamaos de las porciones estndar de los alimentos Cuando coma  alimentos que contengan carbohidratos y que no estn envasados o no incluyan la informacin nutricional en la etiqueta, debe medir las porciones para poder calcular los gramos de carbohidratos. Mida los alimentos que comer con una balanza de alimentos o una taza medidora, si es necesario. Decida cuntas porciones de Programmer, systems. Multiplique el nmero de porciones por 15. En los alimentos que contienen carbohidratos, una porcin [place] a 15 g de carbohidratos. Por ejemplo, si come 2 tazas o 10 onzas (300 g) de fresas, habr comido 2 porciones y 30 g de carbohidratos (2 porciones x 15 g = 30 g). En el caso de las comidas que contienen mezclas de ms de un alimento, como las sopas y los guisos, debe calcular los carbohidratos de cada alimento que se incluye. La siguiente lista contiene los tamaos de porciones estndar de los alimentos ricos en carbohidratos ms comunes. Cada una de estas porciones tiene aproximadamente 15 g de carbohidratos: 1 rebanada de pan. 1 tortilla de seis pulgadas (15 cm). ? de taza o 2 onzas (53 g) de arroz o pasta cocidos.  taza o 3 onzas (85 g) de lentejas o frijoles cocidos o enlatados, escurridos y enjuagados.  taza o 3 onzas (85 g) de verduras con almidn, como guisantes, maz o zapallo.  taza o 4 onzas (120 g) de cereal caliente.  taza o 3  onzas (85 g) de papas hervidas o en pur, o  o 3 onzas (85 g) de una papa grande al horno.  taza o 4 onzas fluidas (118 ml) de jugo de frutas. 1 taza u 8 onzas fluidas (237 ml) de leche. 1 unidad pequea o 4 onzas (106 g) de manzana.  unidad o 2 onzas (63 g) de una banana mediana. 1 taza o 5 oz (150 g) de fresas. 3 tazas o 1 oz (28.3 g) de palomitas de maz. Cul sera un ejemplo de recuento de carbohidratos? Para calcular los gramos de carbohidratos de este ejemplo de comida, siga los pasos que se describen a continuacin. Ejemplo de comida 3 onzas (85 g) de pechugas de pollo. ? de taza o 4 onzas (106 g)  de arroz integral.  taza o 3 onzas (85 g) de maz. 1 taza u 8 onzas fluidas (237 ml) de leche. 1 taza o 5 onzas (150 g) de fresas con crema batida sin azcar. Clculo de carbohidratos Identifique los alimentos que contienen carbohidratos: Arroz. Maz. Leche. Jinny Sanders. Calcule cuntas porciones come de cada alimento: 2 porciones de arroz. 1 porcin de maz. 1 porcin de leche. 1 porcin de fresas. Multiplique cada nmero de porciones por 15 g: 2 porciones de arroz x 15 g = 30 g. 1 porcin de maz x 15 g = 15 g. 1 porcin de leche x 15 g = 15 g. 1 porcin de fresas x 15 g = 15 g. Sume todas las cantidades para conocer el total de gramos de carbohidratos consumidos: 30 g + 15 g + 15 g + 15 g = 75 g de carbohidratos en total. Consejos para seguir este plan Al ir de compras [place] un plan de comidas y luego haga una lista de compras. Compre verduras frescas y congeladas, frutas frescas y congeladas, productos lcteos, huevos, frijoles, lentejas y cereales integrales. Fjese en las etiquetas de los alimentos. Elija los alimentos que tengan ms fibra y Neurosurgeon. Evite los alimentos procesados y los alimentos con Biochemist, clinical. Planificacin de las comidas Trate de consumir la misma cantidad de gramos de carbohidratos en cada comida y en cada colacin. Planifique tomar comidas y colaciones regulares y equilibradas. Dnde buscar ms informacin American Diabetes Association (Asociacin Estadounidense de la Diabetes): diabetes.org Centers for Disease Control and Prevention (Centros para el Control y la Prevencin de [place]): TonerPromos.no Academy of Nutrition and Dietetics (Academia de Nutricin y Pension scheme manager): Theme park [email or web address].org Association of Diabetes Care & Education Specialists (Asociacin de Especialistas en Atencin y Educacin sobre la Diabetes): diabeteseducator.org Resumen El recuento de carbohidratos es un mtodo para llevar un registro de la cantidad de carbohidratos que se  ingieren. La ingesta de carbohidratos aumenta la cantidad de azcar (glucosa) en la sangre. El recuento de la cantidad de carbohidratos que ingiere mejora el control de su glucemia. Esto le ayuda a Chief Operating Officer su diabetes. Un nutricionista puede ayudarlo a crear un plan de alimentacin y a calcular la cantidad de carbohidratos que debe ingerir en cada comida y colacin. Esta informacin no tiene Theme park manager el consejo del mdico. Asegrese de hacerle al mdico cualquier pregunta que tenga. Document Revised: 10/28/2019 Document Reviewed: 10/28/2019 Elsevier Patient Education  2023 [employer].

## 2022-03-06 ENCOUNTER — Other Ambulatory Visit: Payer: Self-pay

## 2022-04-07 ENCOUNTER — Other Ambulatory Visit: Payer: Self-pay

## 2022-04-23 IMAGING — MG MM DIGITAL SCREENING BILAT W/ TOMO AND CAD
8 series · 8 of 24 positions shown · non-contrast
Comparison: Previous exam(s).

CLINICAL DATA: Screening.

EXAM:
DIGITAL SCREENING BILATERAL MAMMOGRAM WITH TOMOSYNTHESIS AND CAD
TECHNIQUE: Bilateral screening digital craniocaudal and mediolateral oblique
mammograms were obtained. Bilateral screening digital breast
tomosynthesis was performed. The images were evaluated with
computer-aided detection.

[L MLO synth-2D]
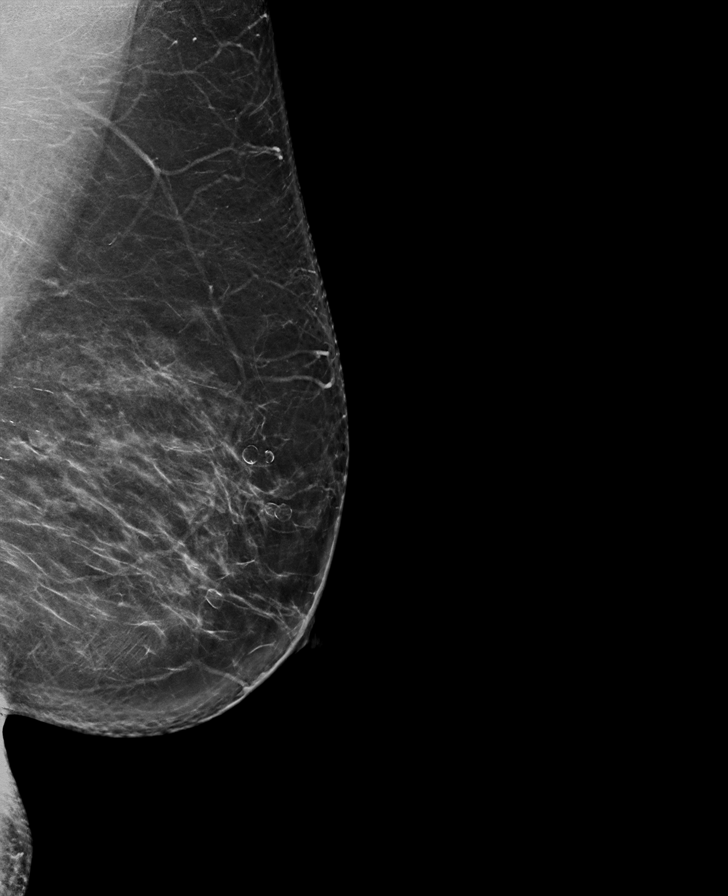

[L CC synth-2D]
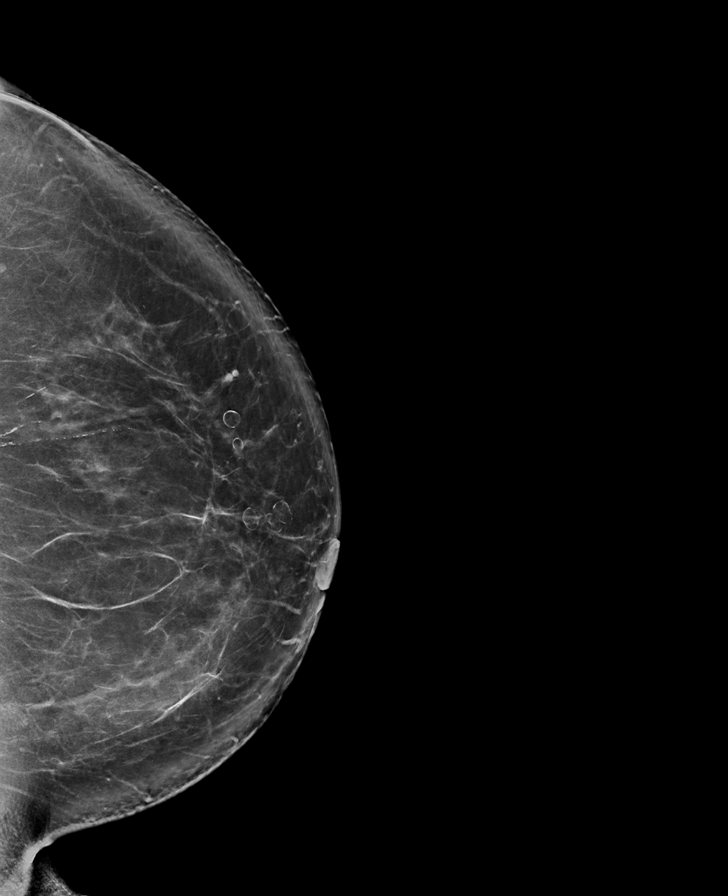

[R CC synth-2D]
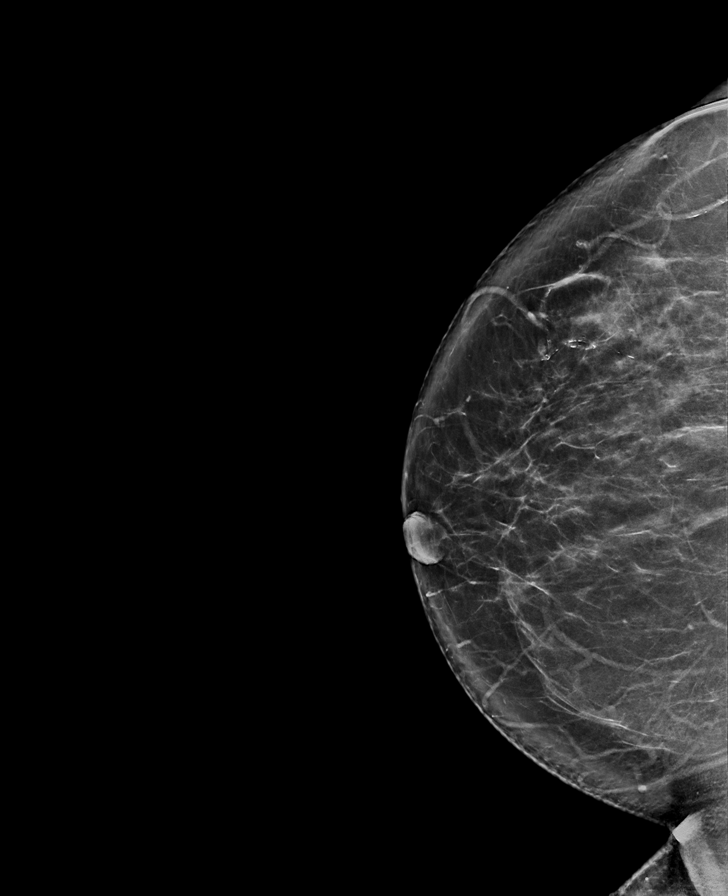

[R MLO synth-2D]
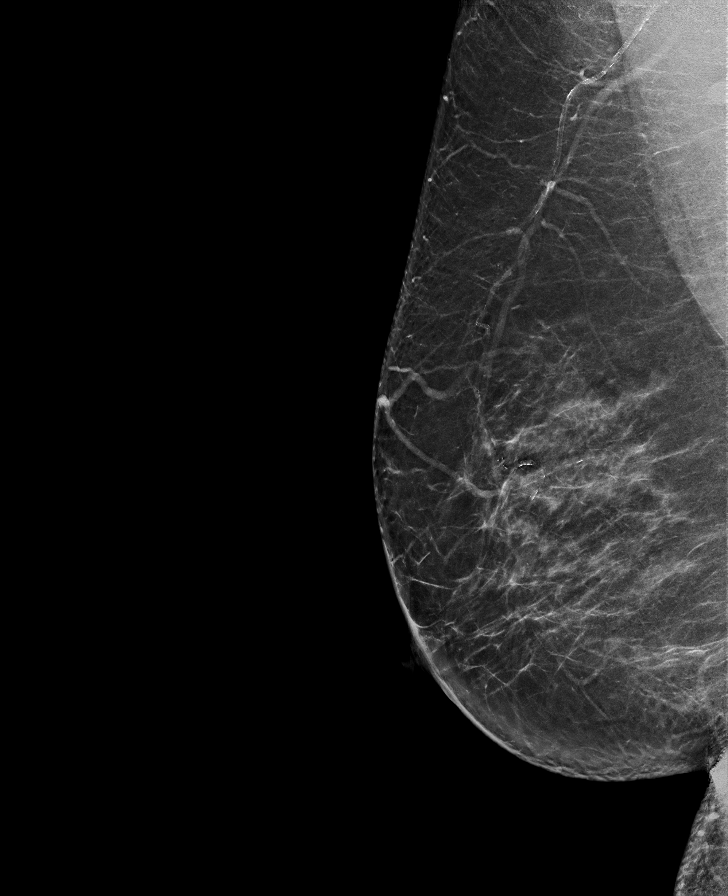

[L CC tomo · tomo slice 49/97.0]
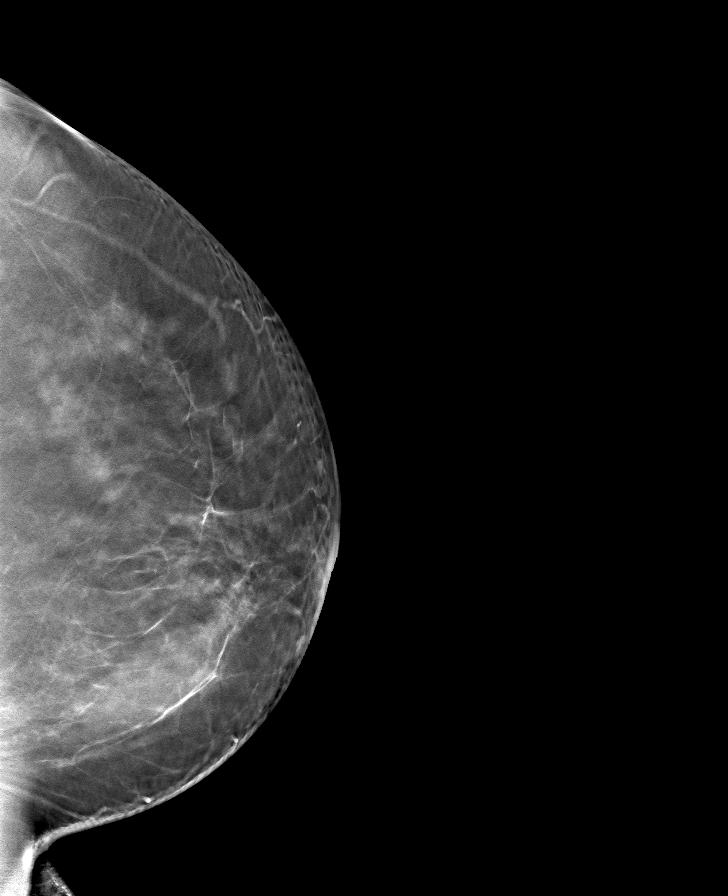

[R CC tomo · tomo slice 46/91.0]
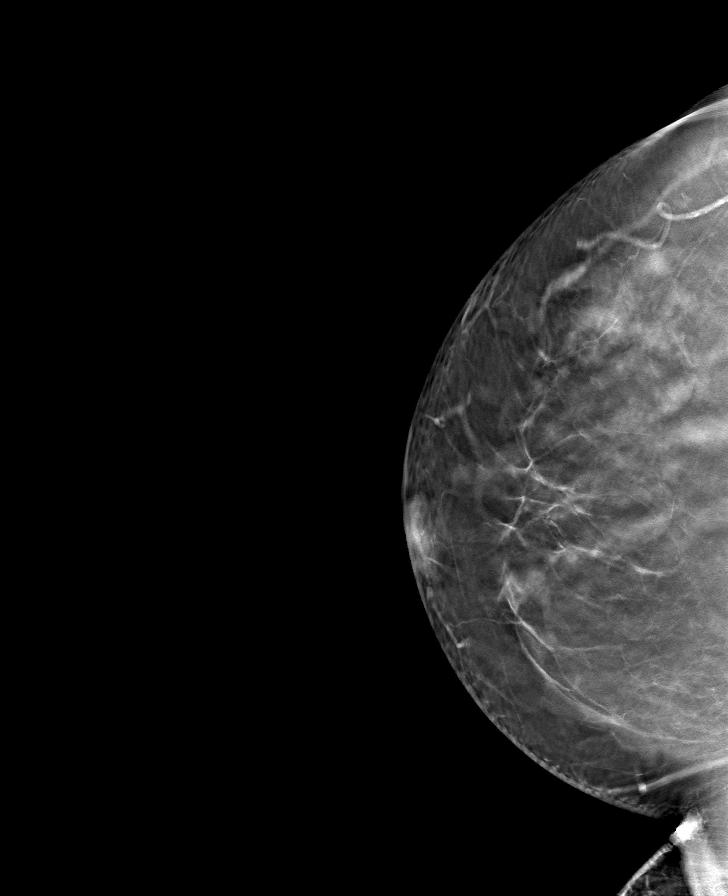

[L MLO tomo · tomo slice 48/95.0]
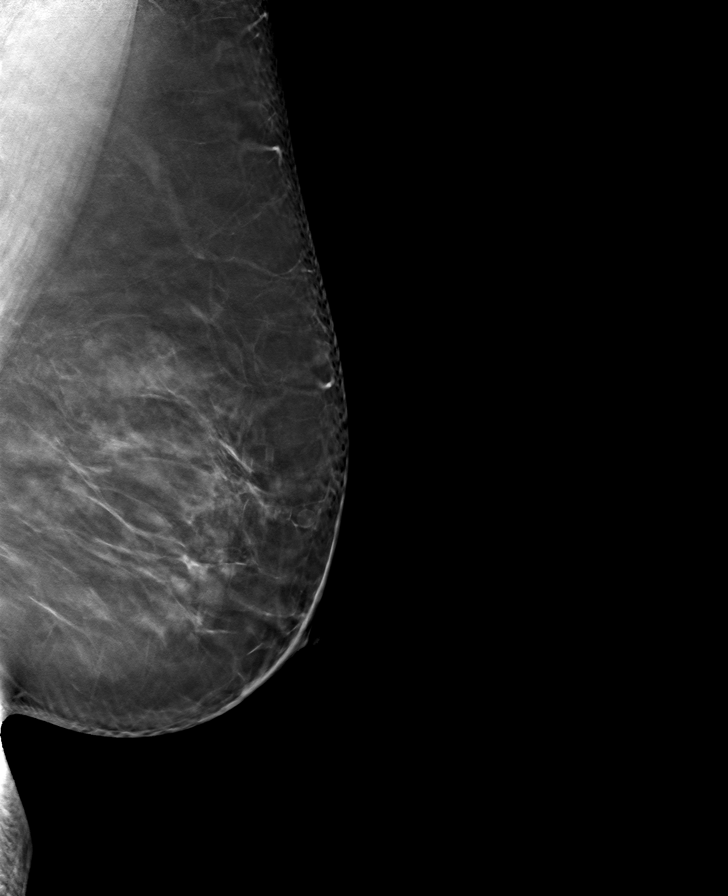

[R MLO tomo · tomo slice 47/92.0]
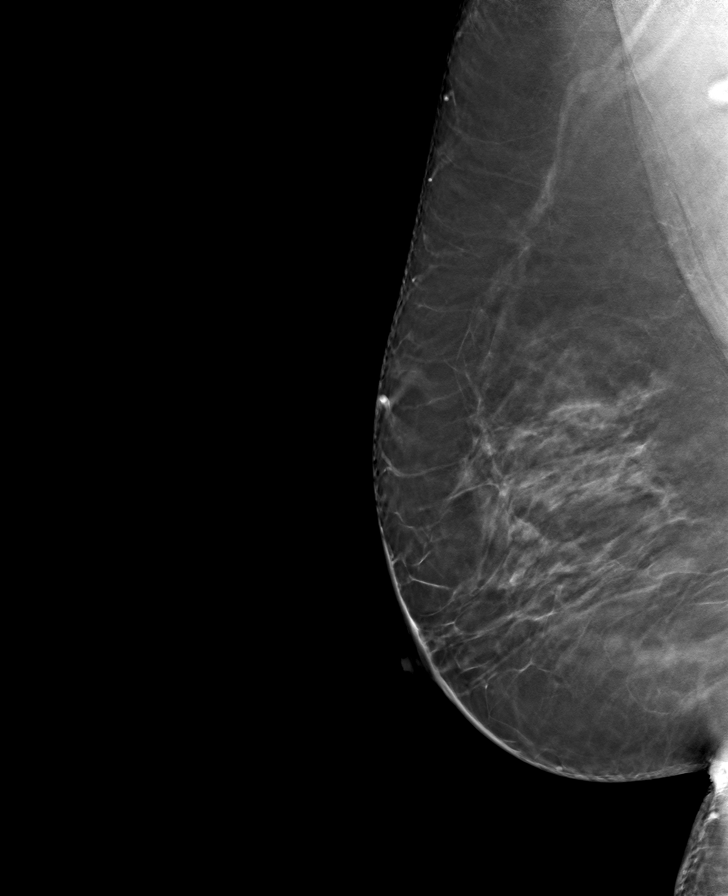

[8 of 24 positions shown; findings below may reference images not displayed]

ACR Breast Density Category b: There are scattered areas of
fibroglandular density.
FINDINGS: There are no findings suspicious for malignancy.
IMPRESSION: No mammographic evidence of malignancy. A result letter of this
screening mammogram will be mailed directly to the patient.

RECOMMENDATION:
Screening mammogram in one year. (Code:51-O-LD2)

BI-RADS CATEGORY  1: Negative.

## 2022-04-27 ENCOUNTER — Other Ambulatory Visit: Payer: Self-pay

## 2022-05-03 ENCOUNTER — Ambulatory Visit: Payer: Self-pay | Admitting: Gerontology

## 2022-05-12 ENCOUNTER — Other Ambulatory Visit: Payer: Self-pay

## 2022-06-16 ENCOUNTER — Other Ambulatory Visit: Payer: Self-pay

## 2022-06-20 ENCOUNTER — Telehealth: Payer: Self-pay | Admitting: Emergency Medicine

## 2022-06-20 NOTE — Telephone Encounter (Signed)
Received fax from [facility], Dr. Carlynn Purl stating that patient's A1c on 06/15/22 was 7.5 and they would like it to be 7.0 or below prior to hernia repair surgery. Surgery is tentatively scheduled for 08/21/22.  Called patient to schedule office visit. No answer. Left message on voice mail with the help of pacific interpreters to call and schedule appointment asap. Patient no showed for her last appointment 05/03/22.

## 2022-06-22 ENCOUNTER — Ambulatory Visit: Payer: Self-pay | Admitting: Gerontology

## 2022-06-22 ENCOUNTER — Other Ambulatory Visit: Payer: Self-pay

## 2022-06-22 VITALS — BP 132/81 | HR 67 | Wt 178.5 lb

## 2022-06-22 DIAGNOSIS — E785 Hyperlipidemia, unspecified: Secondary | ICD-10-CM

## 2022-06-22 DIAGNOSIS — E1165 Type 2 diabetes mellitus with hyperglycemia: Secondary | ICD-10-CM

## 2022-06-22 DIAGNOSIS — Z8639 Personal history of other endocrine, nutritional and metabolic disease: Secondary | ICD-10-CM

## 2022-06-22 LAB — POCT GLYCOSYLATED HEMOGLOBIN (HGB A1C): Hemoglobin A1C: 7.9 % — AB (ref 4.0–5.6)

## 2022-06-22 LAB — GLUCOSE, POCT (MANUAL RESULT ENTRY): POC Glucose: 94 mg/dl (ref 70–99)

## 2022-06-22 MED ORDER — LEVOTHYROXINE SODIUM 50 MCG PO TABS
ORAL_TABLET | Freq: Every day | ORAL | 4 refills | Status: DC
  Filled 2022-06-22: qty 30, fill #0
  Filled 2022-07-18: qty 30, 30d supply, fill #0
  Filled 2022-08-18: qty 30, 30d supply, fill #1
  Filled 2022-09-18: qty 30, 30d supply, fill #2
  Filled 2022-11-08: qty 30, 30d supply, fill #3

## 2022-06-22 MED ORDER — BASAGLAR KWIKPEN 100 UNIT/ML ~~LOC~~ SOPN
24.0000 [IU] | PEN_INJECTOR | Freq: Every day | SUBCUTANEOUS | 3 refills | Status: DC
  Filled 2022-06-22: qty 15, 62d supply, fill #0

## 2022-06-22 MED ORDER — ATORVASTATIN CALCIUM 20 MG PO TABS
20.0000 mg | ORAL_TABLET | Freq: Every day | ORAL | 3 refills | Status: DC
  Filled 2022-06-22 – 2022-07-18 (×2): qty 30, 30d supply, fill #0
  Filled 2022-08-18: qty 30, 30d supply, fill #1
  Filled 2022-09-18: qty 30, 30d supply, fill #2
  Filled 2022-11-08: qty 30, 30d supply, fill #3

## 2022-06-22 MED ORDER — METFORMIN HCL 1000 MG PO TABS
1000.0000 mg | ORAL_TABLET | Freq: Two times a day (BID) | ORAL | 2 refills | Status: DC
  Filled 2022-06-22: qty 60, 30d supply, fill #0
  Filled 2022-08-18: qty 60, 30d supply, fill #1
  Filled 2022-09-18: qty 60, 30d supply, fill #2

## 2022-06-22 NOTE — Patient Instructions (Signed)
Recuento de carbohidratos para la diabetes mellitus en los adultos Carbohydrate Counting for Diabetes Mellitus, Adult El recuento de carbohidratos es un mtodo para llevar un registro de la cantidad de carbohidratos que se ingieren. La ingesta de carbohidratos aumenta la cantidad de azcar (glucosa) en la sangre. El recuento de la cantidad de carbohidratos que ingiere mejora el control de su glucemia. Esto, a su vez, le ayuda a controlar su diabetes. Los carbohidratos se miden en gramos (g) por porcin. Es importante saber la cantidad de carbohidratos (en gramos o por tamao de porcin) que se puede ingerir en cada comida. Esto es diferente en cada persona. Un nutricionista puede ayudarlo a crear un plan de alimentacin y a calcular la cantidad de carbohidratos que debe ingerir en cada comida y colacin. Qu alimentos contienen carbohidratos? Los siguientes alimentos incluyen carbohidratos: Granos, como panes y cereales. Frijoles secos y productos con soja. Verduras con almidn, como papas, guisantes y maz. Frutas y jugos de frutas. Leche y yogur. Dulces y colaciones, como pasteles, galletas, caramelos, papas fritas de bolsa y refrescos. Cmo se calculan los carbohidratos de los alimentos? Hay dos maneras de calcular los carbohidratos de los alimentos. Puede leer las etiquetas de los alimentos o aprender cules son los tamaos de las porciones estndar de los alimentos. Puede usar cualquiera de estos mtodos o una combinacin de ambos. Usar la etiqueta de informacin nutricional La lista de Informacin nutricional est incluida en las etiquetas de casi todas las bebidas y los alimentos envasados de los [place]. Esto incluye lo siguiente: El tamao de la porcin. Informacin sobre los nutrientes de cada porcin, incluidos los gramos de carbohidratos por porcin. Para usar la Informacin nutricional, decida cuntas porciones tomar. Luego, multiplique el nmero de porciones por la cantidad  de carbohidratos por porcin. El nmero resultante es la cantidad total de carbohidratos que comer. Conocer los tamaos de las porciones estndar de los alimentos Cuando coma alimentos que contengan carbohidratos y que no estn envasados o no incluyan la informacin nutricional en la etiqueta, debe medir las porciones para poder calcular los gramos de carbohidratos. Mida los alimentos que comer con una balanza de alimentos o una taza medidora, si es necesario. Decida cuntas porciones de tamao estndar comer. Multiplique el nmero de porciones por 15. En los alimentos que contienen carbohidratos, una porcin equivale a 15 g de carbohidratos. Por ejemplo, si come 2 tazas o 10 onzas (300 g) de fresas, habr comido 2 porciones y 30 g de carbohidratos (2 porciones x 15 g = 30 g). En el caso de las comidas que contienen mezclas de ms de un alimento, como las sopas y los guisos, debe calcular los carbohidratos de cada alimento que se incluye. La siguiente lista contiene los tamaos de porciones estndar de los alimentos ricos en carbohidratos ms comunes. Cada una de estas porciones tiene aproximadamente 15 g de carbohidratos: 1 rebanada de pan. 1 tortilla de seis pulgadas (15 cm). ? de taza o 2 onzas (53 g) de arroz o pasta cocidos.  taza o 3 onzas (85 g) de lentejas o frijoles cocidos o enlatados, escurridos y enjuagados.  taza o 3 onzas (85 g) de verduras con almidn, como guisantes, maz o zapallo.  taza o 4 onzas (120 g) de cereal caliente.  taza o 3 onzas (85 g) de papas hervidas o en pur, o  o 3 onzas (85 g) de una papa grande al horno.  taza o 4 onzas fluidas (118 ml) de jugo de frutas. 1 taza u   8 onzas fluidas (237 ml) de leche. 1 unidad pequea o 4 onzas (106 g) de manzana.  unidad o 2 onzas (63 g) de una banana mediana. 1 taza o 5 oz (150 g) de fresas. 3 tazas o 1 oz (28.3 g) de palomitas de maz. Cul sera un ejemplo de recuento de carbohidratos? Para calcular los gramos  de carbohidratos de este ejemplo de comida, siga los pasos que se describen a continuacin. Ejemplo de comida 3 onzas (85 g) de pechugas de pollo. ? de taza o 4 onzas (106 g) de arroz integral.  taza o 3 onzas (85 g) de maz. 1 taza u 8 onzas fluidas (237 ml) de leche. 1 taza o 5 onzas (150 g) de fresas con crema batida sin azcar. Clculo de carbohidratos Identifique los alimentos que contienen carbohidratos: Arroz. Maz. Leche. Fresas. Calcule cuntas porciones come de cada alimento: 2 porciones de arroz. 1 porcin de maz. 1 porcin de leche. 1 porcin de fresas. Multiplique cada nmero de porciones por 15 g: 2 porciones de arroz x 15 g = 30 g. 1 porcin de maz x 15 g = 15 g. 1 porcin de leche x 15 g = 15 g. 1 porcin de fresas x 15 g = 15 g. Sume todas las cantidades para conocer el total de gramos de carbohidratos consumidos: 30 g + 15 g + 15 g + 15 g = 75 g de carbohidratos en total. Consejos para seguir este plan Al ir de compras Elabore un plan de comidas y luego haga una lista de compras. Compre verduras frescas y congeladas, frutas frescas y congeladas, productos lcteos, huevos, frijoles, lentejas y cereales integrales. Fjese en las etiquetas de los alimentos. Elija los alimentos que tengan ms fibra y menos azcar. Evite los alimentos procesados y los alimentos con azcares agregados. Planificacin de las comidas Trate de consumir la misma cantidad de gramos de carbohidratos en cada comida y en cada colacin. Planifique tomar comidas y colaciones regulares y equilibradas. Dnde buscar ms informacin American Diabetes Association (Asociacin Estadounidense de la Diabetes): diabetes.org Centers for Disease Control and Prevention (Centros para el Control y la Prevencin de Enfermedades): cdc.gov Academy of Nutrition and Dietetics (Academia de Nutricin y Diettica): eatright.org Association of Diabetes Care & Education Specialists (Asociacin de Especialistas en  Atencin y Educacin sobre la Diabetes): diabeteseducator.org Resumen El recuento de carbohidratos es un mtodo para llevar un registro de la cantidad de carbohidratos que se ingieren. La ingesta de carbohidratos aumenta la cantidad de azcar (glucosa) en la sangre. El recuento de la cantidad de carbohidratos que ingiere mejora el control de su glucemia. Esto le ayuda a controlar su diabetes. Un nutricionista puede ayudarlo a crear un plan de alimentacin y a calcular la cantidad de carbohidratos que debe ingerir en cada comida y colacin. Esta informacin no tiene como fin reemplazar el consejo del mdico. Asegrese de hacerle al mdico cualquier pregunta que tenga. Document Revised: 10/28/2019 Document Reviewed: 10/28/2019 Elsevier Patient Education  2023 Elsevier Inc.  

## 2022-06-22 NOTE — Progress Notes (Signed)
Established Patient Office Visit  Subjective   Patient ID: Kristen Aguirre, female    DOB: 1965/07/29  Age: 57 y.o. MRN: [id number]  No chief complaint on file.   HPI  Kristen Aguirre  is a 57 year old female who has history of type 2 diabetes, thyroid disease, hyperlipidemia presents for routine follow up visit. She states that she's compliant with her medications, denies side effects and continues to make healthy lifestyle changes. She states that she's injecting 24 units of Glargine at bedtime. Her HgbA1c  checked during visit was 7.9% and blood glucose was 94 mg/dl. She reports checking her blood glucose bid, states that her fasting reading is usually less than 110 mg/dl and evening in the 409'W mg/dl. She denies hypo/hyperglycemic symptoms, peripheral neuropathy and performs daily foot checks. She was seen at  [facility] by Dr. Thomes Cake for evaluation of right flank hernia  on 06/15/22 and it was recommended that her HgbA1c  needs to be less than 7%. Her hernia repair surgery is scheduled on 08/21/22. Overall, she states that she's doing well and offers no further complaints.     Review of Systems  Constitutional: Negative.   Eyes: Negative.   Respiratory: Negative.    Cardiovascular: Negative.   Gastrointestinal:        Right flank Hernia  Skin: Negative.   Neurological: Negative.   Endo/Heme/Allergies: Negative.   Psychiatric/Behavioral: Negative.           Objective:     BP 132/81 (BP Location: Left Arm, Patient Position: Sitting, Cuff Size: Large)   Pulse 67   Wt 178 lb 8 oz (81 kg)   LMP 07/21/1997 (Approximate)   BMI 36.05 kg/m  BP Readings from Last 3 Encounters:  06/22/22 132/81  02/01/22 118/79  11/02/21 118/72   Wt Readings from Last 3 Encounters:  06/22/22 178 lb 8 oz (81 kg)  02/01/22 175 lb (79.4 kg)  11/02/21 173 lb 6.4 oz (78.7 kg)      Physical Exam HENT:     Head: Normocephalic and atraumatic.   Cardiovascular:     Rate and Rhythm: Normal rate and regular rhythm.     Pulses: Normal pulses.     Heart sounds: Normal heart sounds.  Pulmonary:     Effort: Pulmonary effort is normal.     Breath sounds: Normal breath sounds.  Abdominal:     Hernia: A hernia (right flank hernia) is present.  Skin:    General: Skin is warm.  Neurological:     General: No focal deficit present.     Mental Status: She is alert and oriented to person, place, and time. Mental status is at baseline.  Psychiatric:        Mood and Affect: Mood normal.        Behavior: Behavior normal.        Thought Content: Thought content normal.        Judgment: Judgment normal.      No results found for any visits on 06/22/22.  Last CBC Lab Results  Component Value Date   WBC 6.0 11/02/2021   HGB 14.0 11/02/2021   HCT 42.0 11/02/2021   MCV 94 11/02/2021   MCH 31.5 11/02/2021   RDW 11.8 11/02/2021   PLT 201 11/02/2021   Last metabolic panel Lab Results  Component Value Date   GLUCOSE 127 (H) 11/02/2021   NA 140 11/02/2021   K 4.5 11/02/2021   CL 99 11/02/2021   CO2  27 11/02/2021   BUN 6 11/02/2021   CREATININE 0.56 (L) 11/02/2021   EGFR 108 11/02/2021   CALCIUM 9.4 11/02/2021   PHOS 2.7 11/19/2019   PROT 6.8 11/02/2021   ALBUMIN 4.3 11/02/2021   LABGLOB 2.5 11/02/2021   AGRATIO 1.7 11/02/2021   BILITOT 0.4 11/02/2021   ALKPHOS 75 11/02/2021   AST 34 11/02/2021   ALT 29 11/02/2021   ANIONGAP 11 01/13/2020   Last lipids Lab Results  Component Value Date   CHOL 142 11/02/2021   HDL 54 11/02/2021   LDLCALC 68 11/02/2021   TRIG 108 11/02/2021   CHOLHDL 2.6 11/02/2021   Last hemoglobin A1c Lab Results  Component Value Date   HGBA1C 8.3 (A) 02/01/2022   Last thyroid functions Lab Results  Component Value Date   TSH 1.930 11/02/2021   Last vitamin D Lab Results  Component Value Date   VD25OH 13.5 (L) 07/21/2020   Last vitamin B12 and Folate No results found for: "VITAMINB12",  "FOLATE"    The 10-year ASCVD risk score (Arnett DK, et al., 2019) is: 2.9%    Assessment & Plan:   1. Type 2 diabetes mellitus with hyperglycemia, with long-term current use of insulin (HCC) - Her diabetes is improving, HgbA1c was 7.9%, and goal should be less than 7%. She will continue current medication, low carb/non concentrated sweet diet and exercise as tolerated. - POCT HgB A1C; Future - POCT Glucose (CBG); Future - metFORMIN (GLUCOPHAGE) 1000 MG tablet; Take 1 tablet (1,000 mg total) by mouth 2 (two) times daily with a meal.  Dispense: 60 tablet; Refill: 2 - Insulin Glargine (BASAGLAR KWIKPEN) 100 UNIT/ML; Inject 24 Units into the skin at bedtime.  Dispense: 15 mL; Refill: 3  2. Elevated lipids - She will continue current medication, low fat/cholesterol diet and exercise as tolerated. - atorvastatin (LIPITOR) 20 MG tablet; Take 1 tablet (20 mg total) by mouth daily.  Dispense: 30 tablet; Refill: 3  3. History of hypothyroidism - Her TSH is euthyroid, she will continue on current medication. - levothyroxine (SYNTHROID) 50 MCG tablet; TAKE 1 TABLET (50 MCG TOTAL) BY MOUTH DAILY BEFORE BREAKFAST.  Dispense: 30 tablet; Refill: 4   Return in about 1 month (around 07/25/2022), or if symptoms worsen or fail to improve.    Kristen Henri Trellis Paganini, NP

## 2022-07-11 ENCOUNTER — Other Ambulatory Visit: Payer: Self-pay

## 2022-07-12 ENCOUNTER — Other Ambulatory Visit: Payer: Self-pay

## 2022-07-18 ENCOUNTER — Other Ambulatory Visit: Payer: Self-pay

## 2022-07-26 ENCOUNTER — Ambulatory Visit: Payer: Self-pay | Admitting: Gerontology

## 2022-07-26 ENCOUNTER — Other Ambulatory Visit: Payer: Self-pay

## 2022-07-26 ENCOUNTER — Encounter: Payer: Self-pay | Admitting: Gerontology

## 2022-07-26 VITALS — BP 123/77 | HR 69 | Temp 98.4°F | Resp 16 | Ht 59.0 in | Wt 176.0 lb

## 2022-07-26 DIAGNOSIS — E1165 Type 2 diabetes mellitus with hyperglycemia: Secondary | ICD-10-CM

## 2022-07-26 LAB — GLUCOSE, POCT (MANUAL RESULT ENTRY): POC Glucose: 137 mg/dl — AB (ref 70–99)

## 2022-07-26 NOTE — Progress Notes (Signed)
[employer] used for today's visit, id# [id number].

## 2022-07-26 NOTE — Progress Notes (Signed)
Established Patient Office Visit  Subjective   Patient ID: Kristen Aguirre, female    DOB: 02-Oct-1965  Age: 57 y.o. MRN: [id number]  Chief Complaint  Patient presents with   Follow-up   Diabetes   Sore Throat    Patient c/o sore and itchy throat since this morning.    HPI  Kristen Aguirre  is a 57 year old female who has history of type 2 diabetes, thyroid disease, hyperlipidemia presents for routine follow up visit . She states that she's compliant with her medications, denies side effects and continues to make healthy lifestyle changes. Her HgbA1c checked on  06/22/22 was 7.9%, she state that she checks her blood glucose bid, her fasting readings are usually less than 130 mg/dl and it was 409 mg/dl during visit. She denies hypo/hyperglycemic symptoms, peripheral neuropathy and performs daily foot checks. She is scheduled for right abdominal hernia repair at [facility] on 08/21/22. She c/o sore throat that started 3 days ago, denies fever, chills, cough, loss of taste and shortness of breath. Overall, she states that she's doing well and offers no further complaint.  Review of Systems  Constitutional: Negative.   Eyes: Negative.   Respiratory: Negative.    Cardiovascular: Negative.   Gastrointestinal:  Positive for abdominal pain (intermittent pain at right quadrant at hernia.).  Neurological: Negative.   Endo/Heme/Allergies: Negative.   Psychiatric/Behavioral: Negative.        Objective:     BP 123/77 (BP Location: Right Arm, Patient Position: Sitting, Cuff Size: Normal)   Pulse 69   Temp 98.4 F (36.9 C) (Oral)   Resp 16   Ht 4\' 11"  (1.499 m)   Wt 176 lb (79.8 kg)   LMP 07/21/1997 (Approximate)   SpO2 95%   BMI 35.55 kg/m  BP Readings from Last 3 Encounters:  07/26/22 123/77  06/22/22 132/81  02/01/22 118/79   Wt Readings from Last 3 Encounters:  07/26/22 176 lb (79.8 kg)  06/22/22 178 lb 8 oz (81 kg)  02/01/22 175 lb (79.4 kg)       Physical Exam HENT:     Head: Normocephalic and atraumatic.     Nose: No congestion or rhinorrhea.     Mouth/Throat:     Mouth: Mucous membranes are moist. No oral lesions.     Pharynx: Oropharynx is clear. No pharyngeal swelling, oropharyngeal exudate or posterior oropharyngeal erythema.     Tonsils: No tonsillar exudate or tonsillar abscesses. 0 on the right. 0 on the left.  Eyes:     Conjunctiva/sclera: Conjunctivae normal.  Cardiovascular:     Rate and Rhythm: Normal rate and regular rhythm.     Heart sounds: Normal heart sounds.  Pulmonary:     Effort: Pulmonary effort is normal.     Breath sounds: Normal breath sounds.  Abdominal:     General: Bowel sounds are normal.     Palpations: Abdomen is soft.     Hernia: A hernia (right abdominal quadrant) is present.  Skin:    General: Skin is warm.  Neurological:     General: No focal deficit present.     Mental Status: She is alert and oriented to person, place, and time.  Psychiatric:        Mood and Affect: Mood normal.        Behavior: Behavior normal.      Results for orders placed or performed in visit on 07/26/22  POCT Glucose (CBG)  Result Value Ref Range   POC  Glucose 137 (A) 70 - 99 mg/dl    Last CBC Lab Results  Component Value Date   WBC 6.0 11/02/2021   HGB 14.0 11/02/2021   HCT 42.0 11/02/2021   MCV 94 11/02/2021   MCH 31.5 11/02/2021   RDW 11.8 11/02/2021   PLT 201 11/02/2021   Last metabolic panel Lab Results  Component Value Date   GLUCOSE 127 (H) 11/02/2021   NA 140 11/02/2021   K 4.5 11/02/2021   CL 99 11/02/2021   CO2 27 11/02/2021   BUN 6 11/02/2021   CREATININE 0.56 (L) 11/02/2021   EGFR 108 11/02/2021   CALCIUM 9.4 11/02/2021   PHOS 2.7 11/19/2019   PROT 6.8 11/02/2021   ALBUMIN 4.3 11/02/2021   LABGLOB 2.5 11/02/2021   AGRATIO 1.7 11/02/2021   BILITOT 0.4 11/02/2021   ALKPHOS 75 11/02/2021   AST 34 11/02/2021   ALT 29 11/02/2021   ANIONGAP 11 01/13/2020   Last  lipids Lab Results  Component Value Date   CHOL 142 11/02/2021   HDL 54 11/02/2021   LDLCALC 68 11/02/2021   TRIG 108 11/02/2021   CHOLHDL 2.6 11/02/2021   Last hemoglobin A1c Lab Results  Component Value Date   HGBA1C 7.9 (A) 06/22/2022   Last thyroid functions Lab Results  Component Value Date   TSH 1.930 11/02/2021      The 10-year ASCVD risk score (Arnett DK, et al., 2019) is: 2.5%    Assessment & Plan:   1. Type 2 diabetes mellitus with hyperglycemia, with long-term current use of insulin (HCC) - Her HgbA1c was 7.9% and her goal should be less than 7%. She was encouraged to continue on low carb/non concentrated sweet diet and exercise as tolerated . Her Hernia repair surgery will be done if her HgbA1c is less than 7%. She will continue on current medication regimen. No erythema, no swelling, fever nor chills. He was advised to use warm water gargle and notify clinic for worsening symptoms. - POCT Glucose (CBG)    Return in about 3 weeks (around 08/16/2022), or if symptoms worsen or fail to improve.    Rhiley Tarver Trellis Paganini, NP

## 2022-07-26 NOTE — Patient Instructions (Signed)
Recuento de carbohidratos para la diabetes mellitus en los adultos Carbohydrate Counting for Diabetes Mellitus, Adult El recuento de carbohidratos es un mtodo para llevar un registro de la cantidad de carbohidratos que se ingieren. La ingesta de carbohidratos aumenta la cantidad de azcar (glucosa) en la sangre. El recuento de la cantidad de carbohidratos que ingiere mejora el control de su glucemia. Esto, a su vez, le ayuda a controlar su diabetes. Los carbohidratos se miden en gramos (g) por porcin. Es importante saber la cantidad de carbohidratos (en gramos o por tamao de porcin) que se puede ingerir en cada comida. Esto es diferente en cada persona. Un nutricionista puede ayudarlo a crear un plan de alimentacin y a calcular la cantidad de carbohidratos que debe ingerir en cada comida y colacin. Qu alimentos contienen carbohidratos? Los siguientes alimentos incluyen carbohidratos: Granos, como panes y cereales. Frijoles secos y productos con soja. Verduras con almidn, como papas, guisantes y maz. Frutas y jugos de frutas. Leche y yogur. Dulces y colaciones, como pasteles, galletas, caramelos, papas fritas de bolsa y refrescos. Cmo se calculan los carbohidratos de los alimentos? Hay dos maneras de calcular los carbohidratos de los alimentos. Puede leer las etiquetas de los alimentos o aprender cules son los tamaos de las porciones estndar de los alimentos. Puede usar cualquiera de estos mtodos o una combinacin de ambos. Usar la etiqueta de informacin nutricional La lista de Informacin nutricional est incluida en las etiquetas de casi todas las bebidas y los alimentos envasados de los [place]. Esto incluye lo siguiente: El tamao de la porcin. Informacin sobre los nutrientes de cada porcin, incluidos los gramos de carbohidratos por porcin. Para usar la Informacin nutricional, decida cuntas porciones tomar. Luego, multiplique el nmero de porciones por la cantidad  de carbohidratos por porcin. El nmero resultante es la cantidad total de carbohidratos que comer. Conocer los tamaos de las porciones estndar de los alimentos Cuando coma alimentos que contengan carbohidratos y que no estn envasados o no incluyan la informacin nutricional en la etiqueta, debe medir las porciones para poder calcular los gramos de carbohidratos. Mida los alimentos que comer con una balanza de alimentos o una taza medidora, si es necesario. Decida cuntas porciones de tamao estndar comer. Multiplique el nmero de porciones por 15. En los alimentos que contienen carbohidratos, una porcin equivale a 15 g de carbohidratos. Por ejemplo, si come 2 tazas o 10 onzas (300 g) de fresas, habr comido 2 porciones y 30 g de carbohidratos (2 porciones x 15 g = 30 g). En el caso de las comidas que contienen mezclas de ms de un alimento, como las sopas y los guisos, debe calcular los carbohidratos de cada alimento que se incluye. La siguiente lista contiene los tamaos de porciones estndar de los alimentos ricos en carbohidratos ms comunes. Cada una de estas porciones tiene aproximadamente 15 g de carbohidratos: 1 rebanada de pan. 1 tortilla de seis pulgadas (15 cm). ? de taza o 2 onzas (53 g) de arroz o pasta cocidos.  taza o 3 onzas (85 g) de lentejas o frijoles cocidos o enlatados, escurridos y enjuagados.  taza o 3 onzas (85 g) de verduras con almidn, como guisantes, maz o zapallo.  taza o 4 onzas (120 g) de cereal caliente.  taza o 3 onzas (85 g) de papas hervidas o en pur, o  o 3 onzas (85 g) de una papa grande al horno.  taza o 4 onzas fluidas (118 ml) de jugo de frutas. 1 taza u   8 onzas fluidas (237 ml) de leche. 1 unidad pequea o 4 onzas (106 g) de manzana.  unidad o 2 onzas (63 g) de una banana mediana. 1 taza o 5 oz (150 g) de fresas. 3 tazas o 1 oz (28.3 g) de palomitas de maz. Cul sera un ejemplo de recuento de carbohidratos? Para calcular los gramos  de carbohidratos de este ejemplo de comida, siga los pasos que se describen a continuacin. Ejemplo de comida 3 onzas (85 g) de pechugas de pollo. ? de taza o 4 onzas (106 g) de arroz integral.  taza o 3 onzas (85 g) de maz. 1 taza u 8 onzas fluidas (237 ml) de leche. 1 taza o 5 onzas (150 g) de fresas con crema batida sin azcar. Clculo de carbohidratos Identifique los alimentos que contienen carbohidratos: Arroz. Maz. Leche. Fresas. Calcule cuntas porciones come de cada alimento: 2 porciones de arroz. 1 porcin de maz. 1 porcin de leche. 1 porcin de fresas. Multiplique cada nmero de porciones por 15 g: 2 porciones de arroz x 15 g = 30 g. 1 porcin de maz x 15 g = 15 g. 1 porcin de leche x 15 g = 15 g. 1 porcin de fresas x 15 g = 15 g. Sume todas las cantidades para conocer el total de gramos de carbohidratos consumidos: 30 g + 15 g + 15 g + 15 g = 75 g de carbohidratos en total. Consejos para seguir este plan Al ir de compras El[employer]ore un plan de comidas y luego haga una lista de compras. Compre verduras frescas y congeladas, frutas frescas y congeladas, productos lcteos, huevos, frijoles, lentejas y cereales integrales. Fjese en las etiquetas de los alimentos. Elija los alimentos que tengan ms fibra y menos azcar. Evite los alimentos procesados y los alimentos con azcares agregados. Planificacin de las comidas Trate de consumir la misma cantidad de gramos de carbohidratos en cada comida y en cada colacin. Planifique tomar comidas y colaciones regulares y equilibradas. Dnde buscar ms informacin American Diabetes Association (Asociacin Estadounidense de la Diabetes): diabetes.org Centers for Disease Control and Prevention (Centros para el Control y la Prevencin de Enfermedades): cdc.gov Academy of Nutrition and Dietetics (Academia de Nutricin y Diettica): eatright.org Association of Diabetes Care & Education Specialists (Asociacin de Especialistas en  Atencin y Educacin sobre la Diabetes): diabeteseducator.org Resumen El recuento de carbohidratos es un mtodo para llevar un registro de la cantidad de carbohidratos que se ingieren. La ingesta de carbohidratos aumenta la cantidad de azcar (glucosa) en la sangre. El recuento de la cantidad de carbohidratos que ingiere mejora el control de su glucemia. Esto le ayuda a controlar su diabetes. Un nutricionista puede ayudarlo a crear un plan de alimentacin y a calcular la cantidad de carbohidratos que debe ingerir en cada comida y colacin. Esta informacin no tiene como fin reemplazar el consejo del mdico. Asegrese de hacerle al mdico cualquier pregunta que tenga. Document Revised: 10/28/2019 Document Reviewed: 10/28/2019 Elsevier Patient Education  2024 Elsevier Inc.  

## 2022-08-16 ENCOUNTER — Other Ambulatory Visit: Payer: Self-pay

## 2022-08-16 ENCOUNTER — Encounter: Payer: Self-pay | Admitting: Gerontology

## 2022-08-16 ENCOUNTER — Ambulatory Visit: Payer: Self-pay | Admitting: Gerontology

## 2022-08-16 VITALS — BP 106/74 | HR 67 | Temp 98.6°F | Resp 16 | Ht 59.0 in | Wt 177.4 lb

## 2022-08-16 DIAGNOSIS — Z794 Long term (current) use of insulin: Secondary | ICD-10-CM

## 2022-08-16 LAB — POCT GLYCOSYLATED HEMOGLOBIN (HGB A1C): Hemoglobin A1C: 7.5 % — AB (ref 4.0–5.6)

## 2022-08-16 LAB — GLUCOSE, POCT (MANUAL RESULT ENTRY): POC Glucose: 123 mg/dl — AB (ref 70–99)

## 2022-08-16 NOTE — Progress Notes (Addendum)
[employer] language services used for today's visit, id# [id number]. Connection disconnected. Recall id# [phone] [phone]

## 2022-08-16 NOTE — Progress Notes (Signed)
Established Patient Office Visit  Subjective   Patient ID: Kristen Aguirre, female    DOB: 30-May-1965  Age: 57 y.o. MRN: [id number]  Chief Complaint  Patient presents with   Follow-up   Diabetes    Diabetes   Kristen Aguirre  is a 57 year old female who has history of type 2 diabetes, thyroid disease, hyperlipidemia presents for routine follow up visit.  She states that she's compliant with her medications, denies side effects and continues to make healthy lifestyle changes. Her HgbA1c checked on  06/22/22 was 7.9%, and  today and it was 7.5%.  Her blood glucose was checked also and it was 123 mg/dl.  She checks her blood glucose bid, but forgot to bring her log. She denies hypo/hyperglycemic symptoms, peripheral neuropathy and performs daily foot checks.  She reports that she has introduced an herb "pig weed" into her diet to help improve her health as well. She is scheduled for right abdominal hernia repair at [facility] on 08/21/22. Overall, she states that she's doing well and offers no further complaint.    Review of Systems  Constitutional: Negative.   HENT: Negative.    Eyes: Negative.   Respiratory: Negative.    Cardiovascular: Negative.   Gastrointestinal: Negative.   Musculoskeletal: Negative.   Neurological: Negative.   Endo/Heme/Allergies: Negative.   Psychiatric/Behavioral: Negative.        Objective:     BP 106/74 (BP Location: Right Arm, Patient Position: Sitting, Cuff Size: Large)   Pulse 67   Temp 98.6 F (37 C) (Oral)   Resp 16   Ht 4\' 11"  (1.499 m)   Wt 177 lb 6.4 oz (80.5 kg)   LMP 07/21/1997 (Approximate)   SpO2 96%   BMI 35.83 kg/m  BP Readings from Last 3 Encounters:  08/16/22 106/74  07/26/22 123/77  06/22/22 132/81   Wt Readings from Last 3 Encounters:  08/16/22 177 lb 6.4 oz (80.5 kg)  07/26/22 176 lb (79.8 kg)  06/22/22 178 lb 8 oz (81 kg)      Physical Exam Constitutional:      Appearance: Normal appearance.   Cardiovascular:     Rate and Rhythm: Normal rate and regular rhythm.     Pulses: Normal pulses.     Heart sounds: Normal heart sounds.  Pulmonary:     Effort: Pulmonary effort is normal.     Breath sounds: Normal breath sounds.  Abdominal:     Hernia: A hernia (right abdominal quadrant) is present.  Musculoskeletal:        General: Normal range of motion.     Cervical back: Normal range of motion.  Skin:    General: Skin is warm and dry.  Neurological:     Mental Status: She is alert and oriented to person, place, and time.      Results for orders placed or performed in visit on 08/16/22  POCT Glucose (CBG)  Result Value Ref Range   POC Glucose 123 (A) 70 - 99 mg/dl  POCT HgB H0Q  Result Value Ref Range   Hemoglobin A1C 7.5 (A) 4.0 - 5.6 %   HbA1c POC (<> result, manual entry)     HbA1c, POC (prediabetic range)     HbA1c, POC (controlled diabetic range)      Last CBC Lab Results  Component Value Date   WBC 6.0 11/02/2021   HGB 14.0 11/02/2021   HCT 42.0 11/02/2021   MCV 94 11/02/2021   MCH 31.5 11/02/2021  RDW 11.8 11/02/2021   PLT 201 11/02/2021   Last metabolic panel Lab Results  Component Value Date   GLUCOSE 127 (H) 11/02/2021   NA 140 11/02/2021   K 4.5 11/02/2021   CL 99 11/02/2021   CO2 27 11/02/2021   BUN 6 11/02/2021   CREATININE 0.56 (L) 11/02/2021   EGFR 108 11/02/2021   CALCIUM 9.4 11/02/2021   PHOS 2.7 11/19/2019   PROT 6.8 11/02/2021   ALBUMIN 4.3 11/02/2021   LABGLOB 2.5 11/02/2021   AGRATIO 1.7 11/02/2021   BILITOT 0.4 11/02/2021   ALKPHOS 75 11/02/2021   AST 34 11/02/2021   ALT 29 11/02/2021   ANIONGAP 11 01/13/2020   Last lipids Lab Results  Component Value Date   CHOL 142 11/02/2021   HDL 54 11/02/2021   LDLCALC 68 11/02/2021   TRIG 108 11/02/2021   CHOLHDL 2.6 11/02/2021   Last hemoglobin A1c Lab Results  Component Value Date   HGBA1C 7.5 (A) 08/16/2022   Last thyroid functions Lab Results  Component Value Date    TSH 1.930 11/02/2021   Last vitamin D Lab Results  Component Value Date   VD25OH 13.5 (L) 07/21/2020   Last vitamin B12 and Folate No results found for: "VITAMINB12", "FOLATE"    The 10-year ASCVD risk score (Arnett DK, et al., 2019) is: 1.9%    Assessment & Plan:  1. Type 2 diabetes mellitus with hyperglycemia, with long-term current use of insulin (HCC) Her HgbA1C was 7.5%,  with a goal of less than 7%. She was encouraged to continue on a low carb/ non concentrated sweet diet and exercise as tolerated.  She will continue on current medication regimen.  Refills are available at the pharmacy. - POCT Glucose (CBG) - POCT HgB A1C; Future - POCT HgB A1C   Return in about 13 weeks (around October 19, 2022) or if symptoms worsen or fail to improve.    Return in about 2 months (around 10/19/2022), or if symptoms worsen or fail to improve.    Chioma Trellis Paganini, NP

## 2022-08-16 NOTE — Patient Instructions (Signed)
Diabetes mellitus y nutricin, en adultos Diabetes Mellitus and Nutrition, Adult Si sufre de diabetes, o diabetes mellitus, es muy importante tener hbitos alimenticios saludables debido a que sus niveles de azcar en la sangre (glucosa) se ven afectados en gran medida por lo que come y bebe. Comer alimentos saludables en las cantidades correctas, aproximadamente a la misma hora todos los das, lo ayudar a: Controlar su glucemia. Disminuir el riesgo de sufrir una enfermedad cardaca. Mejorar la presin arterial. Alcanzar o mantener un peso saludable. Qu puede afectar mi plan de alimentacin? Todas las personas que sufren de diabetes son diferentes y cada una tiene necesidades diferentes en cuanto a un plan de alimentacin. El mdico puede recomendarle que trabaje con un nutricionista para elaborar el mejor plan para usted. Su plan de alimentacin puede variar segn factores como: Las caloras que necesita. Los medicamentos que toma. Su peso. Sus niveles de glucemia, presin arterial y colesterol. Su nivel de actividad. Otras afecciones que tenga, como enfermedades cardacas o renales. Cmo me afectan los carbohidratos? Los carbohidratos, o hidratos de carbono, afectan su nivel de glucemia ms que cualquier otro tipo de alimento. La ingesta de carbohidratos aumenta la cantidad de glucosa en la sangre. Es importante conocer la cantidad de carbohidratos que se pueden ingerir en cada comida sin correr ningn riesgo. Esto es diferente en cada persona. Su nutricionista puede ayudarlo a calcular la cantidad de carbohidratos que debe ingerir en cada comida y en cada refrigerio. Cmo me afecta el alcohol? El alcohol puede provocar una disminucin de la glucemia (hipoglucemia), especialmente si usa insulina o toma determinados medicamentos por va oral para la diabetes. La hipoglucemia es una afeccin potencialmente mortal. Los sntomas de la hipoglucemia, como somnolencia, mareos y confusin, son  similares a los sntomas de haber consumido demasiado alcohol. No beba alcohol si: Su mdico le indica no hacerlo. Est embarazada, puede estar embarazada o est tratando de quedar embarazada. Si bebe alcohol: Limite la cantidad que bebe a lo siguiente: De 0 a 1 medida por da para las mujeres. De 0 a 2 medidas por da para los hombres. Sepa cunta cantidad de alcohol hay en las bebidas que toma. En los [place], una medida equivale a una botella de cerveza de 12 oz (355 ml), un vaso de vino de 5 oz (148 ml) o un vaso de una bebida alcohlica de alta graduacin de 1 oz (44 ml). Mantngase hidratado bebiendo agua, refrescos dietticos o t helado sin azcar. Tenga en cuenta que los refrescos comunes, los jugos y otras bebidas para mezclar pueden contener mucha azcar y se deben contar como carbohidratos. Consejos para seguir este plan  Leer las etiquetas de los alimentos Comience por leer el tamao de la porcin en la etiqueta de Informacin nutricional de los alimentos envasados y las bebidas. La cantidad de caloras, carbohidratos, grasas y otros nutrientes detallados en la etiqueta se basan en una porcin del alimento. Muchos alimentos contienen ms de una porcin por envase. Verifique la cantidad total de gramos (g) de carbohidratos totales en una porcin. Verifique la cantidad de gramos de grasas saturadas y grasas trans en una porcin. Escoja alimentos que no contengan estas grasas o que su contenido de estas sea bajo. Verifique la cantidad de miligramos (mg) de sal (sodio) en una porcin. La mayora de las personas deben limitar la ingesta de sodio total a menos de 2300 mg por da. Siempre consulte la informacin nutricional de los alimentos etiquetados como "con bajo contenido de grasa" o "sin grasa".   Estos alimentos pueden tener un mayor contenido de azcar agregada o carbohidratos refinados, y deben evitarse. Hable con su nutricionista para identificar sus objetivos diarios en  cuanto a los nutrientes mencionados en la etiqueta. Al ir de compras Evite comprar alimentos procesados, enlatados o precocidos. Estos alimentos tienden a tener una mayor cantidad de grasa, sodio y azcar agregada. Compre en la zona exterior de la tienda de comestibles. Esta es la zona donde se encuentran con mayor frecuencia las frutas y las verduras frescas, los cereales a granel, las carnes frescas y los productos lcteos frescos. Al cocinar Use mtodos de coccin a baja temperatura, como hornear, en lugar de mtodos de coccin a alta temperatura, como frer en abundante aceite. Cocine con aceites saludables, como el aceite de oliva, canola o girasol. Evite cocinar con manteca, crema o carnes con alto contenido de grasa. Planificacin de las comidas Coma las comidas y los refrigerios regularmente, preferentemente a la misma hora todos los das. Evite pasar largos perodos de tiempo sin comer. Consuma alimentos ricos en fibra, como frutas frescas, verduras, frijoles y cereales integrales. Consuma entre 4 y 6 onzas (entre 112 y 168 g) de protenas magras por da, como carnes magras, pollo, pescado, huevos o tofu. Una onza (oz) (28 g) de protena magra equivale a: 1 onza (28 g) de carne, pollo o pescado. 1 huevo.  taza (62 g) de tofu. Coma algunos alimentos por da que contengan grasas saludables, como aguacates, frutos secos, semillas y pescado. Qu alimentos debo comer? Frutas Bayas. Manzanas. Naranjas. Duraznos. Damascos. Ciruelas. Uvas. Mangos. Papayas. Granadas. Kiwi. Cerezas. Verduras Verduras de hoja verde, que incluyen lechuga, espinaca, col rizada, acelga, hojas de berza, hojas de mostaza y repollo. Remolachas. Coliflor. Brcoli. Zanahorias. Judas verdes. Tomates. Pimientos. Cebollas. Pepinos. Coles de Bruselas. Granos Granos integrales, como panes, galletas, tortillas, cereales y pastas de salvado o integrales. Avena sin azcar. Quinua. Arroz integral o salvaje. Carnes y otras  protenas Frutos de mar. Carne de ave sin piel. Cortes magros de ave y carne de res. Tofu. Frutos secos. Semillas. Lcteos Productos lcteos sin grasa o con bajo contenido de grasa, como leche, yogur y queso. Es posible que los productos detallados arriba no constituyan una lista completa de los alimentos y las bebidas que puede tomar. Consulte a un nutricionista para obtener ms informacin. Qu alimentos debo evitar? Frutas Frutas enlatadas al almbar. Verduras Verduras enlatadas. Verduras congeladas con mantequilla o salsa de crema. Granos Productos elaborados con harina y harina blanca refinada, como panes, pastas, bocadillos y cereales. Evite todos los alimentos procesados. Carnes y otras protenas Cortes de carne con alto contenido de grasa. Carne de ave con piel. Carnes empanizadas o fritas. Carne procesada. Evite las grasas saturadas. Lcteos Yogur, queso o leche enteros. Bebidas Bebidas azucaradas, como gaseosas o t helado. Es posible que los productos que se enumeran ms arriba no constituyan una lista completa de los alimentos y las bebidas que debe evitar. Consulte a un nutricionista para obtener ms informacin. Preguntas para hacerle al mdico Debo consultar con un especialista certificado en atencin y educacin sobre la diabetes? Es necesario que me rena con un nutricionista? A qu nmero puedo llamar si tengo preguntas? Cules son los mejores momentos para controlar la glucemia? Dnde encontrar ms informacin: American Diabetes Association (Asociacin Estadounidense de la Diabetes): diabetes.org Academy of Nutrition and Dietetics (Academia de Nutricin y Diettica): eatright.org National Institute of Diabetes and Digestive and Kidney Diseases (Instituto Nacional de la Diabetes y las Enfermedades Digestivas y Renales): niddk.nih.gov Association of Diabetes   Care & Education Specialists (Asociacin de Especialistas en Atencin y Educacin sobre la Diabetes):  diabeteseducator.org Resumen Es importante tener hbitos alimenticios saludables debido a que sus niveles de azcar en la sangre (glucosa) se ven afectados en gran medida por lo que come y bebe. Es importante consumir alcohol con prudencia. Un plan de comidas saludable lo ayudar a controlar la glucosa en sangre y a reducir el riesgo de enfermedades cardacas. El mdico puede recomendarle que trabaje con un nutricionista para elaborar el mejor plan para usted. Esta informacin no tiene como fin reemplazar el consejo del mdico. Asegrese de hacerle al mdico cualquier pregunta que tenga. Document Revised: 10/15/2019 Document Reviewed: 10/15/2019 Elsevier Patient Education  2024 Elsevier Inc.  

## 2022-08-18 ENCOUNTER — Other Ambulatory Visit: Payer: Self-pay

## 2022-09-18 ENCOUNTER — Other Ambulatory Visit: Payer: Self-pay

## 2022-10-05 ENCOUNTER — Other Ambulatory Visit: Payer: Self-pay

## 2022-10-12 ENCOUNTER — Other Ambulatory Visit: Payer: Self-pay

## 2022-10-18 ENCOUNTER — Encounter: Payer: Self-pay | Admitting: Gerontology

## 2022-10-18 ENCOUNTER — Ambulatory Visit: Payer: Self-pay | Admitting: Gerontology

## 2022-10-18 ENCOUNTER — Other Ambulatory Visit: Payer: Self-pay

## 2022-10-18 VITALS — BP 111/78 | HR 73 | Ht 60.0 in | Wt 179.4 lb

## 2022-10-18 DIAGNOSIS — G8918 Other acute postprocedural pain: Secondary | ICD-10-CM

## 2022-10-18 DIAGNOSIS — Z794 Long term (current) use of insulin: Secondary | ICD-10-CM

## 2022-10-18 MED ORDER — METFORMIN HCL 1000 MG PO TABS
1000.0000 mg | ORAL_TABLET | Freq: Two times a day (BID) | ORAL | 2 refills | Status: DC
  Filled 2022-10-18: qty 60, 30d supply, fill #0
  Filled 2023-01-02: qty 60, 30d supply, fill #1

## 2022-10-18 NOTE — Progress Notes (Signed)
Established Patient Office Visit  Subjective   Patient ID: Kristen Aguirre, female    DOB: 03/07/1965  Age: 57 y.o. MRN: [id number]  Chief Complaint  Patient presents with   Follow-up    HPI   Kristen Aguirre  is a 57 year old female who has history of type 2 diabetes, thyroid disease, hyperlipidemia presents for routine visit and post surgery assessment. She states that she's compliant with her medications, denies side effects and continues to make healthy lifestyle changes.  She had a right abdominal hernia repair at [facility] on 08/21/22 and her lap sites are healed, no erythema, swelling or discharge. She states that she is currently experiencing pain to her right abdominal quadrant. She rates her pain  as 3/10 and it radiates to her back. She states that standing, walking and other household activities  aggravate pain and taking tylenol 500 mg daily as needed relieves pain. Her HgbA1c checked on  08/16/22  was 7.5%. She states that she checks her blood glucose bid, but forgot to bring her log. She denies hypo/hyperglycemic symptoms, peripheral neuropathy and performs daily foot checks. Overall, she states that she's doing well and offers no further complaint.      Patient Active Problem List   Diagnosis Date Noted   Lump of left breast 07/27/2021   Type 2 diabetes mellitus with hyperglycemia (HCC) 08/04/2020   History of hypothyroidism 08/04/2020   Elevated lipids 08/04/2020   Vitamin D deficiency 08/04/2020   Abdominal wall lump 07/21/2020   Abnormality of gait 02/09/2020   Perinephric hematoma 02/09/2020   Distal radius fracture, right    Concussion with loss of consciousness    Drug-induced hypotension    Drug induced constipation    Diabetes mellitus, new onset (HCC)    Acute blood loss anemia    Post-operative pain    Compression fracture of body of thoracic vertebra (HCC) 12/04/2019   Displaced fracture of posterior wall of left acetabulum, initial  encounter for closed fracture (HCC) 11/19/2019   Closed dislocation of left hip (HCC) 11/19/2019   Traumatic rhabdomyolysis (HCC) 11/18/2019   Elevated LFTs 11/18/2019   COVID-19 virus infection    Multiple injuries due to trauma 11/14/2019   Healthcare maintenance 01/02/2017   Low back pain 01/02/2017   Past Medical History:  Diagnosis Date   Closed dislocation of left hip (HCC) 11/19/2019   Diabetes mellitus without complication (HCC)    Displaced fracture of posterior wall of left acetabulum, initial encounter for closed fracture (HCC) 11/19/2019   Thyroid disease    Past Surgical History:  Procedure Laterality Date   CLOSED REDUCTION FINGER WITH PERCUTANEOUS PINNING Left 12/02/2019   Procedure: CLOSED REDUCTION FINGER WITH PERCUTANEOUS PINNING SMALL FINGER;  Surgeon: Betha Loa, MD;  Location: [facility] OR;  Service: Orthopedics;  Laterality: Left;   EXTREMITY WIRE/PIN REMOVAL Left 01/13/2020   Procedure: REMOVAL K-WIRE/PIN EXTREMITY;  Surgeon: Betha Loa, MD;  Location: [facility];  Service: Orthopedics;  Laterality: Left;   FRACTURE SURGERY     HARDWARE REMOVAL Right 01/13/2020   Procedure: EXTERNAL FIXATOR RIGHT DISTAL RADIUS   ;  Surgeon: Betha Loa, MD;  Location: [facility];  Service: Orthopedics;  Laterality: Right;   HIP CLOSED REDUCTION Left 11/15/2019   Procedure: CLOSED REDUCTION HIP;  Surgeon: Yolonda Kida, MD;  Location: [facility] OR;  Service: Orthopedics;  Laterality: Left;   I & D EXTREMITY Right 11/15/2019   Procedure: IRRIGATION AND DEBRIDEMENT; EXPLORATION POSSIBLE REPAIR OF  TENDON AND NERVE;  Surgeon: Betha Loa, MD;  Location: [facility] OR;  Service: Orthopedics;  Laterality: Right;   OPEN REDUCTION INTERNAL FIXATION (ORIF) DISTAL RADIAL FRACTURE Right 11/15/2019   Procedure: DISTAL RADIAL FRACTURE -RIGHT  EXTERNAL FIXATION; IRRIGATION AND DEBRIDEMENT LEFT SMALL FINGER WITH CLOSURE LACERATION. ;  Surgeon: Betha Loa, MD;  Location: [facility] OR;   Service: Orthopedics;  Laterality: Right;  LEFT SMALL FINGER IRRIGATION AND DEBRIDEMENT WITH CLOSURE LACERATION ADDED.   OPEN REDUCTION INTERNAL FIXATION (ORIF) DISTAL RADIAL FRACTURE Right 12/02/2019   Procedure: OPEN REDUCTION RADIUS WITH PERCUTANEOU PINNING;  Surgeon: Betha Loa, MD;  Location: [facility] OR;  Service: Orthopedics;  Laterality: Right;   OPEN REDUCTION INTERNAL FIXATION ACETABULUM FRACTURE POSTERIOR Left 11/17/2019   Procedure: OPEN REDUCTION INTERNAL FIXATION ACETABULUM FRACTURE POSTERIOR;  Surgeon: Myrene Galas, MD;  Location: [facility] OR;  Service: Orthopedics;  Laterality: Left;   Social History   Tobacco Use   Smoking status: Never   Smokeless tobacco: Never  Vaping Use   Vaping status: Never Used  Substance Use Topics   Alcohol use: Yes    Comment: social - once per month   Drug use: Never   Social History   Socioeconomic History   Marital status: Single    Spouse name: Not on file   Number of children: Not on file   Years of education: Not on file   Highest education level: Not on file  Occupational History   Not on file  Tobacco Use   Smoking status: Never   Smokeless tobacco: Never  Vaping Use   Vaping status: Never Used  Substance and Sexual Activity   Alcohol use: Yes    Comment: social - once per month   Drug use: Never   Sexual activity: Not on file  Other Topics Concern   Not on file  Social History Narrative   Not on file   Social Determinants of Health   Financial Resource Strain: High Risk (10/18/2022)   Overall Financial Resource Strain (CARDIA)    Difficulty of Paying Living Expenses: Hard  Food Insecurity: Food Insecurity Present (08/16/2022)   Hunger Vital Sign    Worried About Running Out of Food in the Last Year: Sometimes true    Ran Out of Food in the Last Year: Sometimes true  Transportation Needs: No Transportation Needs (07/27/2021)   PRAPARE - Administrator, Civil Service (Medical): No    Lack of Transportation  (Non-Medical): No  Physical Activity: Sufficiently Active (10/18/2022)   Exercise Vital Sign    Days of Exercise per Week: 7 days    Minutes of Exercise per Session: 30 min  Stress: Stress Concern Present (10/18/2022)   [employer] of Occupational Health - Occupational Stress Questionnaire    Feeling of Stress : To some extent  Social Connections: Socially Isolated (10/18/2022)   Social Connection and Isolation Panel [NHANES]    Frequency of Communication with Friends and Family: More than three times a week    Frequency of Social Gatherings with Friends and Family: Once a week    Attends Religious Services: Never    Database administrator or Organizations: No    Attends Banker Meetings: Never    Marital Status: Separated  Intimate Partner Violence: Not At Risk (08/16/2022)   Humiliation, Afraid, Rape, and Kick questionnaire    Fear of Current or Ex-Partner: No    Emotionally Abused: No    Physically Abused: No    Sexually Abused: No  Family Status  Relation Name Status   Mother  Deceased   Father  Deceased   Sister  Deceased   Mat Aunt  Alive   MGF  Deceased   Neg Hx  (Not Specified)  No partnership data on file   Family History  Problem Relation Age of Onset   Kidney disease Mother    Diabetes Mother    Other Father        unknown medical history   Kidney disease Sister    Diabetes Sister    Diabetes Maternal Aunt    Diabetes Maternal Grandfather    Breast cancer Neg Hx    No Known Allergies    Review of Systems  Constitutional: Negative.   HENT: Negative.    Eyes: Negative.  Negative for blurred vision.  Respiratory: Negative.    Cardiovascular: Negative.   Gastrointestinal:  Positive for abdominal pain (abdominal pain s/p hernia repair procedure).  Genitourinary: Negative.   Skin: Negative.   Neurological: Negative.  Negative for headaches.  Endo/Heme/Allergies: Negative.   Psychiatric/Behavioral: Negative.        Objective:      BP 111/78   Pulse 73   Ht 5' (1.524 m)   Wt 179 lb 6.4 oz (81.4 kg)   LMP 07/21/1997 (Approximate)   SpO2 95%   BMI 35.04 kg/m  BP Readings from Last 3 Encounters:  10/18/22 111/78  08/16/22 106/74  07/26/22 123/77   Wt Readings from Last 3 Encounters:  10/18/22 179 lb 6.4 oz (81.4 kg)  08/16/22 177 lb 6.4 oz (80.5 kg)  07/26/22 176 lb (79.8 kg)      Physical Exam Constitutional:      Appearance: Normal appearance.  HENT:     Head: Normocephalic.  Eyes:     Pupils: Pupils are equal, round, and reactive to light.  Cardiovascular:     Rate and Rhythm: Normal rate and regular rhythm.     Pulses: Normal pulses.     Heart sounds: Normal heart sounds.  Pulmonary:     Effort: Pulmonary effort is normal.     Breath sounds: Normal breath sounds.  Abdominal:     General: Bowel sounds are normal.     Palpations: Abdomen is soft.     Tenderness: There is no abdominal tenderness.  Skin:    General: Skin is warm.  Neurological:     General: No focal deficit present.     Mental Status: She is alert and oriented to person, place, and time.  Psychiatric:        Mood and Affect: Mood normal.        Behavior: Behavior normal.        Thought Content: Thought content normal.        Judgment: Judgment normal.      No results found for any visits on 10/18/22.  Last CBC Lab Results  Component Value Date   WBC 6.0 11/02/2021   HGB 14.0 11/02/2021   HCT 42.0 11/02/2021   MCV 94 11/02/2021   MCH 31.5 11/02/2021   RDW 11.8 11/02/2021   PLT 201 11/02/2021   Last metabolic panel Lab Results  Component Value Date   GLUCOSE 127 (H) 11/02/2021   NA 140 11/02/2021   K 4.5 11/02/2021   CL 99 11/02/2021   CO2 27 11/02/2021   BUN 6 11/02/2021   CREATININE 0.56 (L) 11/02/2021   EGFR 108 11/02/2021   CALCIUM 9.4 11/02/2021   PHOS 2.7 11/19/2019   PROT 6.8  11/02/2021   ALBUMIN 4.3 11/02/2021   LABGLOB 2.5 11/02/2021   AGRATIO 1.7 11/02/2021   BILITOT 0.4 11/02/2021    ALKPHOS 75 11/02/2021   AST 34 11/02/2021   ALT 29 11/02/2021   ANIONGAP 11 01/13/2020   Last lipids Lab Results  Component Value Date   CHOL 142 11/02/2021   HDL 54 11/02/2021   LDLCALC 68 11/02/2021   TRIG 108 11/02/2021   CHOLHDL 2.6 11/02/2021   Last hemoglobin A1c Lab Results  Component Value Date   HGBA1C 7.5 (A) 08/16/2022   Last thyroid functions Lab Results  Component Value Date   TSH 1.930 11/02/2021   Last vitamin D Lab Results  Component Value Date   VD25OH 13.5 (L) 07/21/2020   Last vitamin B12 and Folate No results found for: "VITAMINB12", "FOLATE"    The 10-year ASCVD risk score (Arnett DK, et al., 2019) is: 2.1%    Assessment & Plan:  1. Type 2 diabetes mellitus with hyperglycemia, with long-term current use of insulin (HCC) She was encouraged to continue on low carb/non concentrated sweet diet and exercise as tolerated. She will continue on current medication regimen. No erythema, no swelling, fever nor chills. He was advised to use warm water gargle and notify clinic for worsening symptoms.  - metFORMIN (GLUCOPHAGE) 1000 MG tablet; Take 1 tablet (1,000 mg total) by mouth 2 (two) times daily with a meal.  Dispense: 60 tablet; Refill: 2  2. Post-operative pain She was encouraged to continue taking Robaxin and gabapentin as needed and call Surgical team with worsening pain or go to the ED. She was encouraged to follow up with her appointment.    Return in about 27 days (around 11/14/2022), or if symptoms worsen or fail to improve.   Chioma Trellis Paganini, NP

## 2022-10-18 NOTE — Patient Instructions (Signed)
Recuento de carbohidratos para la diabetes mellitus en los adultos Carbohydrate Counting for Diabetes Mellitus, Adult El recuento de carbohidratos es un mtodo para llevar un registro de la cantidad de carbohidratos que se ingieren. La ingesta de carbohidratos aumenta la cantidad de azcar (glucosa) en la sangre. El recuento de la cantidad de carbohidratos que ingiere mejora el control de su glucemia. Esto, a su vez, le ayuda a controlar su diabetes. Los carbohidratos se miden en gramos (g) por porcin. Es importante saber la cantidad de carbohidratos (en gramos o por tamao de porcin) que se puede ingerir en cada comida. Esto es diferente en cada persona. Un nutricionista puede ayudarlo a crear un plan de alimentacin y a calcular la cantidad de carbohidratos que debe ingerir en cada comida y colacin. Qu alimentos contienen carbohidratos? Los siguientes alimentos incluyen carbohidratos: Granos, como panes y cereales. Frijoles secos y productos con soja. Verduras con almidn, como papas, guisantes y maz. Frutas y jugos de frutas. Leche y yogur. Dulces y colaciones, como pasteles, galletas, caramelos, papas fritas de bolsa y refrescos. Cmo se calculan los carbohidratos de los alimentos? Hay dos maneras de calcular los carbohidratos de los alimentos. Puede leer las etiquetas de los alimentos o aprender cules son los tamaos de las porciones estndar de los alimentos. Puede usar cualquiera de estos mtodos o una combinacin de ambos. Usar la etiqueta de informacin nutricional La lista de Informacin nutricional est incluida en las etiquetas de casi todas las bebidas y los alimentos envasados de los [place]. Esto incluye lo siguiente: El tamao de la porcin. Informacin sobre los nutrientes de cada porcin, incluidos los gramos de carbohidratos por porcin. Para usar la Informacin nutricional, decida cuntas porciones tomar. Luego, multiplique el nmero de porciones por la cantidad  de carbohidratos por porcin. El nmero resultante es la cantidad total de carbohidratos que comer. Conocer los tamaos de las porciones estndar de los alimentos Cuando coma alimentos que contengan carbohidratos y que no estn envasados o no incluyan la informacin nutricional en la etiqueta, debe medir las porciones para poder calcular los gramos de carbohidratos. Mida los alimentos que comer con una balanza de alimentos o una taza medidora, si es necesario. Decida cuntas porciones de tamao estndar comer. Multiplique el nmero de porciones por 15. En los alimentos que contienen carbohidratos, una porcin equivale a 15 g de carbohidratos. Por ejemplo, si come 2 tazas o 10 onzas (300 g) de fresas, habr comido 2 porciones y 30 g de carbohidratos (2 porciones x 15 g = 30 g). En el caso de las comidas que contienen mezclas de ms de un alimento, como las sopas y los guisos, debe calcular los carbohidratos de cada alimento que se incluye. La siguiente lista contiene los tamaos de porciones estndar de los alimentos ricos en carbohidratos ms comunes. Cada una de estas porciones tiene aproximadamente 15 g de carbohidratos: 1 rebanada de pan. 1 tortilla de seis pulgadas (15 cm). ? de taza o 2 onzas (53 g) de arroz o pasta cocidos.  taza o 3 onzas (85 g) de lentejas o frijoles cocidos o enlatados, escurridos y enjuagados.  taza o 3 onzas (85 g) de verduras con almidn, como guisantes, maz o zapallo.  taza o 4 onzas (120 g) de cereal caliente.  taza o 3 onzas (85 g) de papas hervidas o en pur, o  o 3 onzas (85 g) de una papa grande al horno.  taza o 4 onzas fluidas (118 ml) de jugo de frutas. 1 taza u   8 onzas fluidas (237 ml) de leche. 1 unidad pequea o 4 onzas (106 g) de manzana.  unidad o 2 onzas (63 g) de una banana mediana. 1 taza o 5 oz (150 g) de fresas. 3 tazas o 1 oz (28.3 g) de palomitas de maz. Cul sera un ejemplo de recuento de carbohidratos? Para calcular los gramos  de carbohidratos de este ejemplo de comida, siga los pasos que se describen a continuacin. Ejemplo de comida 3 onzas (85 g) de pechugas de pollo. ? de taza o 4 onzas (106 g) de arroz integral.  taza o 3 onzas (85 g) de maz. 1 taza u 8 onzas fluidas (237 ml) de leche. 1 taza o 5 onzas (150 g) de fresas con crema batida sin azcar. Clculo de carbohidratos Identifique los alimentos que contienen carbohidratos: Arroz. Maz. Leche. Fresas. Calcule cuntas porciones come de cada alimento: 2 porciones de arroz. 1 porcin de maz. 1 porcin de leche. 1 porcin de fresas. Multiplique cada nmero de porciones por 15 g: 2 porciones de arroz x 15 g = 30 g. 1 porcin de maz x 15 g = 15 g. 1 porcin de leche x 15 g = 15 g. 1 porcin de fresas x 15 g = 15 g. Sume todas las cantidades para conocer el total de gramos de carbohidratos consumidos: 30 g + 15 g + 15 g + 15 g = 75 g de carbohidratos en total. Consejos para seguir este plan Al ir de compras El[employer]ore un plan de comidas y luego haga una lista de compras. Compre verduras frescas y congeladas, frutas frescas y congeladas, productos lcteos, huevos, frijoles, lentejas y cereales integrales. Fjese en las etiquetas de los alimentos. Elija los alimentos que tengan ms fibra y menos azcar. Evite los alimentos procesados y los alimentos con azcares agregados. Planificacin de las comidas Trate de consumir la misma cantidad de gramos de carbohidratos en cada comida y en cada colacin. Planifique tomar comidas y colaciones regulares y equilibradas. Dnde buscar ms informacin American Diabetes Association (Asociacin Estadounidense de la Diabetes): diabetes.org Centers for Disease Control and Prevention (Centros para el Control y la Prevencin de Enfermedades): cdc.gov Academy of Nutrition and Dietetics (Academia de Nutricin y Diettica): eatright.org Association of Diabetes Care & Education Specialists (Asociacin de Especialistas en  Atencin y Educacin sobre la Diabetes): diabeteseducator.org Resumen El recuento de carbohidratos es un mtodo para llevar un registro de la cantidad de carbohidratos que se ingieren. La ingesta de carbohidratos aumenta la cantidad de azcar (glucosa) en la sangre. El recuento de la cantidad de carbohidratos que ingiere mejora el control de su glucemia. Esto le ayuda a controlar su diabetes. Un nutricionista puede ayudarlo a crear un plan de alimentacin y a calcular la cantidad de carbohidratos que debe ingerir en cada comida y colacin. Esta informacin no tiene como fin reemplazar el consejo del mdico. Asegrese de hacerle al mdico cualquier pregunta que tenga. Document Revised: 10/28/2019 Document Reviewed: 10/28/2019 Elsevier Patient Education  2024 Elsevier Inc.  

## 2022-10-19 ENCOUNTER — Ambulatory Visit: Payer: Self-pay | Admitting: Gerontology

## 2022-10-20 ENCOUNTER — Other Ambulatory Visit: Payer: Self-pay

## 2022-11-08 ENCOUNTER — Other Ambulatory Visit: Payer: Self-pay

## 2022-11-14 ENCOUNTER — Encounter: Payer: Self-pay | Admitting: Gerontology

## 2022-11-14 ENCOUNTER — Ambulatory Visit: Payer: Self-pay | Admitting: Gerontology

## 2022-11-14 ENCOUNTER — Other Ambulatory Visit: Payer: Self-pay

## 2022-11-14 VITALS — BP 126/82 | HR 70 | Ht 60.0 in | Wt 177.0 lb

## 2022-11-14 DIAGNOSIS — E1165 Type 2 diabetes mellitus with hyperglycemia: Secondary | ICD-10-CM

## 2022-11-14 DIAGNOSIS — Z8639 Personal history of other endocrine, nutritional and metabolic disease: Secondary | ICD-10-CM

## 2022-11-14 DIAGNOSIS — E785 Hyperlipidemia, unspecified: Secondary | ICD-10-CM

## 2022-11-14 LAB — POCT GLYCOSYLATED HEMOGLOBIN (HGB A1C): Hemoglobin A1C: 7.5 % — AB (ref 4.0–5.6)

## 2022-11-14 LAB — GLUCOSE, POCT (MANUAL RESULT ENTRY): POC Glucose: 185 mg/dl — AB (ref 70–99)

## 2022-11-14 MED ORDER — ATORVASTATIN CALCIUM 20 MG PO TABS
20.0000 mg | ORAL_TABLET | Freq: Every day | ORAL | 3 refills | Status: AC
  Filled 2022-11-14 – 2023-01-02 (×2): qty 30, 30d supply, fill #0

## 2022-11-14 MED ORDER — LEVOTHYROXINE SODIUM 50 MCG PO TABS
50.0000 ug | ORAL_TABLET | Freq: Every day | ORAL | 4 refills | Status: AC
  Filled 2022-11-14: qty 30, fill #0
  Filled 2023-01-02: qty 30, 30d supply, fill #0

## 2022-11-14 NOTE — Patient Instructions (Signed)
Recuento de carbohidratos para la diabetes mellitus en los adultos Carbohydrate Counting for Diabetes Mellitus, Adult El recuento de carbohidratos es un mtodo para llevar un registro de la cantidad de carbohidratos que se ingieren. La ingesta de carbohidratos aumenta la cantidad de azcar (glucosa) en la sangre. El recuento de la cantidad de carbohidratos que ingiere mejora el control de su glucemia. Esto, a su vez, le ayuda a controlar su diabetes. Los carbohidratos se miden en gramos (g) por porcin. Es importante saber la cantidad de carbohidratos (en gramos o por tamao de porcin) que se puede ingerir en cada comida. Esto es diferente en cada persona. Un nutricionista puede ayudarlo a crear un plan de alimentacin y a calcular la cantidad de carbohidratos que debe ingerir en cada comida y colacin. Qu alimentos contienen carbohidratos? Los siguientes alimentos incluyen carbohidratos: Granos, como panes y cereales. Frijoles secos y productos con soja. Verduras con almidn, como papas, guisantes y maz. Frutas y jugos de frutas. Leche y yogur. Dulces y colaciones, como pasteles, galletas, caramelos, papas fritas de bolsa y refrescos. Cmo se calculan los carbohidratos de los alimentos? Hay dos maneras de calcular los carbohidratos de los alimentos. Puede leer las etiquetas de los alimentos o aprender cules son los tamaos de las porciones estndar de los alimentos. Puede usar cualquiera de estos mtodos o una combinacin de ambos. Usar la etiqueta de informacin nutricional La lista de Informacin nutricional est incluida en las etiquetas de casi todas las bebidas y los alimentos envasados de los [place]. Esto incluye lo siguiente: El tamao de la porcin. Informacin sobre los nutrientes de cada porcin, incluidos los gramos de carbohidratos por porcin. Para usar la Informacin nutricional, decida cuntas porciones tomar. Luego, multiplique el nmero de porciones por la cantidad  de carbohidratos por porcin. El nmero resultante es la cantidad total de carbohidratos que comer. Conocer los tamaos de las porciones estndar de los alimentos Cuando coma alimentos que contengan carbohidratos y que no estn envasados o no incluyan la informacin nutricional en la etiqueta, debe medir las porciones para poder calcular los gramos de carbohidratos. Mida los alimentos que comer con una balanza de alimentos o una taza medidora, si es necesario. Decida cuntas porciones de tamao estndar comer. Multiplique el nmero de porciones por 15. En los alimentos que contienen carbohidratos, una porcin equivale a 15 g de carbohidratos. Por ejemplo, si come 2 tazas o 10 onzas (300 g) de fresas, habr comido 2 porciones y 30 g de carbohidratos (2 porciones x 15 g = 30 g). En el caso de las comidas que contienen mezclas de ms de un alimento, como las sopas y los guisos, debe calcular los carbohidratos de cada alimento que se incluye. La siguiente lista contiene los tamaos de porciones estndar de los alimentos ricos en carbohidratos ms comunes. Cada una de estas porciones tiene aproximadamente 15 g de carbohidratos: 1 rebanada de pan. 1 tortilla de seis pulgadas (15 cm). ? de taza o 2 onzas (53 g) de arroz o pasta cocidos.  taza o 3 onzas (85 g) de lentejas o frijoles cocidos o enlatados, escurridos y enjuagados.  taza o 3 onzas (85 g) de verduras con almidn, como guisantes, maz o zapallo.  taza o 4 onzas (120 g) de cereal caliente.  taza o 3 onzas (85 g) de papas hervidas o en pur, o  o 3 onzas (85 g) de una papa grande al horno.  taza o 4 onzas fluidas (118 ml) de jugo de frutas. 1 taza u   8 onzas fluidas (237 ml) de leche. 1 unidad pequea o 4 onzas (106 g) de manzana.  unidad o 2 onzas (63 g) de una banana mediana. 1 taza o 5 oz (150 g) de fresas. 3 tazas o 1 oz (28.3 g) de palomitas de maz. Cul sera un ejemplo de recuento de carbohidratos? Para calcular los gramos  de carbohidratos de este ejemplo de comida, siga los pasos que se describen a continuacin. Ejemplo de comida 3 onzas (85 g) de pechugas de pollo. ? de taza o 4 onzas (106 g) de arroz integral.  taza o 3 onzas (85 g) de maz. 1 taza u 8 onzas fluidas (237 ml) de leche. 1 taza o 5 onzas (150 g) de fresas con crema batida sin azcar. Clculo de carbohidratos Identifique los alimentos que contienen carbohidratos: Arroz. Maz. Leche. Fresas. Calcule cuntas porciones come de cada alimento: 2 porciones de arroz. 1 porcin de maz. 1 porcin de leche. 1 porcin de fresas. Multiplique cada nmero de porciones por 15 g: 2 porciones de arroz x 15 g = 30 g. 1 porcin de maz x 15 g = 15 g. 1 porcin de leche x 15 g = 15 g. 1 porcin de fresas x 15 g = 15 g. Sume todas las cantidades para conocer el total de gramos de carbohidratos consumidos: 30 g + 15 g + 15 g + 15 g = 75 g de carbohidratos en total. Consejos para seguir este plan Al ir de compras El[employer]ore un plan de comidas y luego haga una lista de compras. Compre verduras frescas y congeladas, frutas frescas y congeladas, productos lcteos, huevos, frijoles, lentejas y cereales integrales. Fjese en las etiquetas de los alimentos. Elija los alimentos que tengan ms fibra y menos azcar. Evite los alimentos procesados y los alimentos con azcares agregados. Planificacin de las comidas Trate de consumir la misma cantidad de gramos de carbohidratos en cada comida y en cada colacin. Planifique tomar comidas y colaciones regulares y equilibradas. Dnde buscar ms informacin American Diabetes Association (Asociacin Estadounidense de la Diabetes): diabetes.org Centers for Disease Control and Prevention (Centros para el Control y la Prevencin de Enfermedades): cdc.gov Academy of Nutrition and Dietetics (Academia de Nutricin y Diettica): eatright.org Association of Diabetes Care & Education Specialists (Asociacin de Especialistas en  Atencin y Educacin sobre la Diabetes): diabeteseducator.org Resumen El recuento de carbohidratos es un mtodo para llevar un registro de la cantidad de carbohidratos que se ingieren. La ingesta de carbohidratos aumenta la cantidad de azcar (glucosa) en la sangre. El recuento de la cantidad de carbohidratos que ingiere mejora el control de su glucemia. Esto le ayuda a controlar su diabetes. Un nutricionista puede ayudarlo a crear un plan de alimentacin y a calcular la cantidad de carbohidratos que debe ingerir en cada comida y colacin. Esta informacin no tiene como fin reemplazar el consejo del mdico. Asegrese de hacerle al mdico cualquier pregunta que tenga. Document Revised: 10/28/2019 Document Reviewed: 10/28/2019 Elsevier Patient Education  2024 Elsevier Inc.  
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
## 2022-11-15 LAB — LIPID PANEL
Chol/HDL Ratio: 3.3 ratio (ref 0.0–4.4)
Cholesterol, Total: 152 mg/dL (ref 100–199)
HDL: 46 mg/dL (ref >39)
LDL Chol Calc (NIH): 82 mg/dL (ref 0–99)
Triglycerides: 133 mg/dL (ref 0–149)
VLDL Cholesterol Cal: 24 mg/dL (ref 5–40)

## 2022-11-15 LAB — COMPREHENSIVE METABOLIC PANEL
ALT: 18 IU/L (ref 0–32)
AST: 22 IU/L (ref 0–40)
Albumin: 4.2 g/dL (ref 3.8–4.9)
Alkaline Phosphatase: 89 IU/L (ref 44–121)
BUN/Creatinine Ratio: 11 (ref 9–23)
BUN: 7 mg/dL (ref 6–24)
Bilirubin Total: 0.3 mg/dL (ref 0.0–1.2)
CO2: 27 mmol/L (ref 20–29)
Calcium: 9.3 mg/dL (ref 8.7–10.2)
Chloride: 104 mmol/L (ref 96–106)
Creatinine, Ser: 0.65 mg/dL (ref 0.57–1.00)
Globulin, Total: 2.6 g/dL (ref 1.5–4.5)
Glucose: 227 mg/dL — ABNORMAL HIGH (ref 70–99)
Potassium: 4.6 mmol/L (ref 3.5–5.2)
Sodium: 142 mmol/L (ref 134–144)
Total Protein: 6.8 g/dL (ref 6.0–8.5)
eGFR: 104 mL/min/{1.73_m2} (ref >59)

## 2022-11-15 LAB — CBC WITH DIFFERENTIAL/PLATELET
Basophils Absolute: 0 10*3/uL (ref 0.0–0.2)
Basos: 0 %
EOS (ABSOLUTE): 0.1 10*3/uL (ref 0.0–0.4)
Eos: 2 %
Hematocrit: 40.4 % (ref 34.0–46.6)
Hemoglobin: 13.5 g/dL (ref 11.1–15.9)
Immature Grans (Abs): 0 10*3/uL (ref 0.0–0.1)
Immature Granulocytes: 0 %
Lymphocytes Absolute: 1.2 10*3/uL (ref 0.7–3.1)
Lymphs: 24 %
MCH: 30.7 pg (ref 26.6–33.0)
MCHC: 33.4 g/dL (ref 31.5–35.7)
MCV: 92 fL (ref 79–97)
Monocytes Absolute: 0.3 10*3/uL (ref 0.1–0.9)
Monocytes: 5 %
Neutrophils Absolute: 3.4 10*3/uL (ref 1.4–7.0)
Neutrophils: 69 %
Platelets: 208 10*3/uL (ref 150–450)
RBC: 4.4 x10E6/uL (ref 3.77–5.28)
RDW: 12.3 % (ref 11.7–15.4)
WBC: 5 10*3/uL (ref 3.4–10.8)

## 2022-11-15 LAB — VITAMIN D 25 HYDROXY (VIT D DEFICIENCY, FRACTURES): Vit D, 25-Hydroxy: 24 ng/mL — ABNORMAL LOW (ref 30.0–100.0)

## 2022-11-15 LAB — TSH: TSH: 2.29 u[IU]/mL (ref 0.450–4.500)

## 2023-01-02 ENCOUNTER — Other Ambulatory Visit: Payer: Self-pay

## 2023-01-31 ENCOUNTER — Ambulatory Visit: Payer: Self-pay | Admitting: Gerontology

## 2023-10-06 ENCOUNTER — Emergency Department: Payer: Self-pay

## 2023-10-06 ENCOUNTER — Emergency Department
Admission: EM | Admit: 2023-10-06 | Discharge: 2023-10-06 | Disposition: A | Discharge status: Home / Self Care | Payer: Self-pay | Attending: Emergency Medicine | Admitting: Emergency Medicine

## 2023-10-06 DIAGNOSIS — S6991XA Unspecified injury of right wrist, hand and finger(s), initial encounter: Secondary | ICD-10-CM | POA: Diagnosis not present

## 2023-10-06 DIAGNOSIS — Y92410 Unspecified street and highway as the place of occurrence of the external cause: Secondary | ICD-10-CM | POA: Insufficient documentation

## 2023-10-06 DIAGNOSIS — S51811A Laceration without foreign body of right forearm, initial encounter: Secondary | ICD-10-CM | POA: Diagnosis not present

## 2023-10-06 DIAGNOSIS — S59911A Unspecified injury of right forearm, initial encounter: Secondary | ICD-10-CM | POA: Diagnosis present

## 2023-10-06 LAB — CBC WITH DIFFERENTIAL/PLATELET
Abs Immature Granulocytes: 0.09 K/uL — ABNORMAL HIGH (ref 0.00–0.07)
Basophils Absolute: 0 K/uL (ref 0.0–0.1)
Basophils Relative: 1 %
Eosinophils Absolute: 0.1 K/uL (ref 0.0–0.5)
Eosinophils Relative: 1 %
HCT: 45.3 % (ref 36.0–46.0)
Hemoglobin: 15.3 g/dL — ABNORMAL HIGH (ref 12.0–15.0)
Immature Granulocytes: 1 %
Lymphocytes Relative: 30 %
Lymphs Abs: 2.5 K/uL (ref 0.7–4.0)
MCH: 31.5 pg (ref 26.0–34.0)
MCHC: 33.8 g/dL (ref 30.0–36.0)
MCV: 93.2 fL (ref 80.0–100.0)
Monocytes Absolute: 0.4 K/uL (ref 0.1–1.0)
Monocytes Relative: 4 %
Neutro Abs: 5.4 K/uL (ref 1.7–7.7)
Neutrophils Relative %: 63 %
Platelets: 258 K/uL (ref 150–400)
RBC: 4.86 MIL/uL (ref 3.87–5.11)
RDW: 12.3 % (ref 11.5–15.5)
WBC: 8.5 K/uL (ref 4.0–10.5)
nRBC: 0 % (ref 0.0–0.2)

## 2023-10-06 LAB — URINE DRUG SCREEN, QUALITATIVE (ARMC ONLY)
Amphetamines, Ur Screen: NOT DETECTED
Barbiturates, Ur Screen: NOT DETECTED
Benzodiazepine, Ur Scrn: NOT DETECTED
Cannabinoid 50 Ng, Ur ~~LOC~~: NOT DETECTED
Cocaine Metabolite,Ur ~~LOC~~: POSITIVE — AB
MDMA (Ecstasy)Ur Screen: NOT DETECTED
Methadone Scn, Ur: NOT DETECTED
Opiate, Ur Screen: NOT DETECTED
Phencyclidine (PCP) Ur S: NOT DETECTED
Tricyclic, Ur Screen: NOT DETECTED

## 2023-10-06 LAB — ETHANOL: Alcohol, Ethyl (B): 181 mg/dL — ABNORMAL HIGH (ref <15)

## 2023-10-06 LAB — BASIC METABOLIC PANEL WITH GFR
Anion gap: 13 (ref 5–15)
BUN: 11 mg/dL (ref 6–20)
CO2: 21 mmol/L — ABNORMAL LOW (ref 22–32)
Calcium: 9 mg/dL (ref 8.9–10.3)
Chloride: 103 mmol/L (ref 98–111)
Creatinine, Ser: 0.55 mg/dL (ref 0.44–1.00)
GFR, Estimated: >60 mL/min (ref >60)
Glucose, Bld: 397 mg/dL — ABNORMAL HIGH (ref 70–99)
Potassium: 3.7 mmol/L (ref 3.5–5.1)
Sodium: 137 mmol/L (ref 135–145)

## 2023-10-06 MED ORDER — LIDOCAINE-EPINEPHRINE 2 %-1:100000 IJ SOLN
30.0000 mL | Freq: Once | INTRAMUSCULAR | Status: AC
  Administered 2023-10-06: 30 mL

## 2023-10-06 MED ORDER — LIDOCAINE-EPINEPHRINE (PF) 2 %-1:200000 IJ SOLN
INTRAMUSCULAR | Status: AC
  Filled 2023-10-06: qty 40
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
## 2023-10-09 ENCOUNTER — Telehealth: Payer: Self-pay

## 2023-10-09 NOTE — Telephone Encounter (Signed)
 Called pt with interpreter. No answer left msg for pt to call back to r/s appt.

## 2023-10-18 ENCOUNTER — Ambulatory Visit: Payer: Self-pay | Admitting: Gerontology

## 2023-10-18 VITALS — BP 124/72 | HR 78 | Temp 98.6°F | Ht 59.84 in | Wt 168.5 lb

## 2023-10-18 DIAGNOSIS — L209 Atopic dermatitis, unspecified: Secondary | ICD-10-CM

## 2023-10-18 DIAGNOSIS — Z794 Long term (current) use of insulin: Secondary | ICD-10-CM

## 2023-10-18 DIAGNOSIS — E119 Type 2 diabetes mellitus without complications: Secondary | ICD-10-CM

## 2023-10-18 DIAGNOSIS — Z0000 Encounter for general adult medical examination without abnormal findings: Secondary | ICD-10-CM

## 2023-10-18 LAB — POCT GLYCOSYLATED HEMOGLOBIN (HGB A1C): Hemoglobin A1C: 11.2 % — AB (ref 4.0–5.6)

## 2023-10-18 LAB — GLUCOSE, POCT (MANUAL RESULT ENTRY): POC Glucose: 265 mg/dL — AB (ref 70–99)

## 2023-10-18 MED ORDER — INSULIN PEN NEEDLE 32G X 4 MM MISC
1.0000 | Freq: Every day | 3 refills | Status: AC
  Filled 2023-10-18: qty 100, 100d supply, fill #0

## 2023-10-18 MED ORDER — BASAGLAR KWIKPEN 100 UNIT/ML ~~LOC~~ SOPN
10.0000 [IU] | PEN_INJECTOR | Freq: Every day | SUBCUTANEOUS | 3 refills | Status: AC
Start: 2023-10-18 — End: ?
  Filled 2023-10-18: qty 15, 140d supply, fill #0

## 2023-10-18 MED ORDER — CLOTRIMAZOLE 1 % EX CREA
1.0000 | TOPICAL_CREAM | Freq: Two times a day (BID) | CUTANEOUS | 0 refills | Status: AC
  Filled 2023-10-18: qty 30, 15d supply, fill #0

## 2023-10-18 MED ORDER — METFORMIN HCL 500 MG PO TABS
500.0000 mg | ORAL_TABLET | Freq: Two times a day (BID) | ORAL | 0 refills | Status: AC
  Filled 2023-10-18: qty 60, 30d supply, fill #0

## 2023-10-18 NOTE — Progress Notes (Unsigned)
   Established Patient Office Visit  Subjective   Patient ID: Kristen Aguirre, female    DOB: 03-17-1965  Age: 58 y.o. MRN: [id number]  No chief complaint on file.  [employer] Language interpreter was used  HPI  Kristen Aguirre  is a 58 year old female who has history of type 2 diabetes, thyroid disease, hyperlipidemia presents for follow up visit. She has not followed up for her appointment since 11/14/22. Her blood glucose was 265 mg/dl, atopic dermatitis under breast, armpit abdominal folds  Review of Systems  Skin:        Atopic dermatitis to armpit      Objective:     BP 124/72 (Cuff Size: Normal)   Pulse 78   Temp 98.6 F (37 C)   Ht 4' 11.84 (1.52 m)   Wt 168 lb 8 oz (76.4 kg)   LMP 07/21/1997 (Approximate)   SpO2 97%   BMI 33.08 kg/m  {Vitals History (Optional):23777}  Physical Exam   No results found for any visits on 10/18/23.  {Labs (Optional):23779}  The 10-year ASCVD risk score (Arnett DK, et al., 2019) is: 3.5%    Assessment & Plan:   Problem List Items Addressed This Visit   None Visit Diagnoses       Diabetes mellitus without complication (HCC)           No follow-ups on file.    Cartrell Bentsen E Devora Tortorella, NP

## 2023-10-18 NOTE — Patient Instructions (Signed)
 Diabetes: cmo calcular los carbohidratos que necesita un adulto Diabetes: Carbohydrate Counting for Adults El recuento de carbohidratos es un mtodo para llevar un registro de la cantidad de carbohidratos que ingiere. La ingesta de carbohidratos aumenta la cantidad de azcar, tambin llamada glucosa, en la sangre. Si cuenta cuntos carbohidratos ingiere, controlar mejor su nivel de azcar en la sangre. Esto, a su vez, le ayuda a controlar la diabetes. Los carbohidratos se calculan en gramos (G) por porcin. Es importante saber la cantidad de carbohidratos (en gramos o por tamao de porcin) que puede ingerir en cada comida. Esto es Government social research officer. Un nutricionista puede ayudarlo a crear un plan de alimentacin y a calcular la cantidad de carbohidratos que debe ingerir en cada comida y colacin. Qu alimentos contienen carbohidratos? Los carbohidratos se encuentran en estos alimentos: Granos, como panes y cereales. Frijoles secos y alimentos con soja. Verduras con almidn, como papas, guisantes y maz. Joretta y jugos de frutas. Leche y Dentist. Dulces y colaciones, como pasteles, galletas, caramelos, papas fritas y refrescos. Cmo se calculan los carbohidratos de los alimentos? Hay dos maneras de calcular los carbohidratos de los alimentos. Puede leer las etiquetas o conocer el tamao de las porciones estndar de los alimentos. Puede usar cualquiera de estos mtodos o combinarlos. Usar la Air cabin crew de informacin nutricional La lista de nutrientes est incluida en las etiquetas de casi todas las bebidas y los alimentos envasados de los [place]. Incluye: El tamao de la porcin. Informacin sobre los nutrientes de cada porcin. Esto incluye los gramos de carbohidratos por porcin. Para usar la Informacin nutricional, decida cuntas porciones comer. Luego, multiplique el nmero de porciones por la cantidad de carbohidratos que tiene cada porcin. El nmero resultante son los  gramos totales de carbohidratos que ingerir. Conocer los tamaos de las porciones estndar de los alimentos Cuando consuma alimentos con carbohidratos y sin envasar, o sin la informacin nutricional en la etiqueta, debe medir las porciones para calcular los gramos de carbohidratos. Dosifique los alimentos que consumir con una balanza de alimentos o una taza medidora si es necesario. Decida cuntas porciones de Programmer, systems. Multiplique el nmero de porciones por 15. En los alimentos con carbohidratos, una porcin equivale a 15 G de carbohidratos. Por ejemplo, si consume 2 tazas o 10 OZ (300 G) de fresas, habr ingerido 2 porciones y 26 G de carbohidratos (2 porciones x 15 G = 30 G). Para las comidas que contienen mezclas de ms de un alimento, como las sopas y los guisos, debe calcular los carbohidratos de cada alimento. A continuacin, se presenta una lista de los tamaos estndar de las porciones de alimentos ricos en carbohidratos. Cada una de estas porciones tiene alrededor de 15 G de carbohidratos: 1 rebanada de pan. 1 tortilla de seis pulgadas (15 CM). ? de taza o 2 OZ (53 G) de arroz o pasta cocidos.  taza o 3 OZ (85 G) de lentejas o frijoles cocidos o enlatados, escurridos y enjuagados.  taza o 3 OZ (85 G) de verduras con almidn, como guisantes, maz o zapallo.  taza o 4 OZ (120 G) de cereal cocinado.  taza o 3 OZ (85 G) de papas hervidas o pur, o  o 3 OZ (85 G) de una papa grande al horno.  taza o 4 OZ fluidas (118 ML) de jugo de frutas. 1 taza u 8 OZ fluidas (237 ML) de [place]. 1 unidad pequea o 4 OZ (106 G) de manzana.  unidad o 2 OZ (63  G) de una banana mediana. 1 taza o 5 OZ (150 G) de fresas. 3 tazas o 1 OZ (28.3 G) de palomitas de maz. Cul sera un ejemplo para calcular los carbohidratos? Para calcular los gramos de carbohidratos del siguiente ejemplo, siga los pasos descritos a continuacin. Ejemplo de comida 3 OZ (85 G) de pechugas de pollo. ? de  taza o 4 OZ (106 G) de arroz integral.  taza o 3 OZ (85 G) de maz. 1 taza u 8 OZ fluidas (237 ML) de [place]. 1 taza o 5 OZ (150 G) de fresas con crema batida sin azcar. Clculo de carbohidratos Identifique los alimentos con carbohidratos: Arroz. Maz. Leche. Jaclynn. Calcule cuntas porciones come de cada alimento: 2 porciones de arroz. 1 porcin de maz. 1 porcin de leche. 1 porcin de fresas. Multiplique el nmero de porciones por 15 G: 2 porciones de arroz x 15 G = 30 G. 1 porcin de maz x 15 G = 15 G. 1 porcin de leche x 15 G = 15 G. 1 porcin de fresas x 15 G = 15 G. Sume todas las cantidades para calcular los gramos totales de carbohidratos ingeridos: 30 G + 15 G + 15 G + 15 G = 75 G de carbohidratos totales. Dnde obtener ms informacin Para obtener ms informacin, visite los siguientes sitios web: American Diabetes Association (Asociacin Estadounidense de Diabetes): diabetes.org. Haga clic en "Search" (buscar) y escriba "carb counting" (recuento de carbohidratos). Busque el enlace que necesita. Centers for Disease Control and Prevention (Centros para el Control y la Prevencin de [place]): TonerPromos.no. Haga clic en "Search" (buscar) y escriba "diabetes". Busque el enlace que necesita. Academy of Nutrition and Dietetics (Academia de Nutricin y Pension scheme manager): eatright.org Esta informacin no tiene Theme park manager el consejo del mdico. Asegrese de hacerle al mdico cualquier pregunta que tenga. Document Revised: 02/28/2023 Document Reviewed: 02/28/2023 Elsevier Patient Education  2025 [employer].

## 2023-10-19 ENCOUNTER — Other Ambulatory Visit: Payer: Self-pay

## 2023-10-19 DIAGNOSIS — L209 Atopic dermatitis, unspecified: Secondary | ICD-10-CM | POA: Insufficient documentation

## 2023-10-23 ENCOUNTER — Other Ambulatory Visit: Payer: Self-pay

## 2023-10-24 ENCOUNTER — Other Ambulatory Visit: Payer: Self-pay

## 2023-10-25 ENCOUNTER — Ambulatory Visit: Payer: Self-pay

## 2024-02-26 ENCOUNTER — Telehealth: Payer: Self-pay

## 2024-02-26 NOTE — Telephone Encounter (Signed)
 Called pt to make appt. No vmail set up.
